# Patient Record
Sex: Male | Born: 1955 | Race: White | Hispanic: No | Smoking: Never smoker
Health system: Southern US, Community
[De-identification: ages and names within clinical notes are randomized; demographics above are authoritative.]

## PROBLEM LIST (undated history)

## (undated) DIAGNOSIS — I10 Essential (primary) hypertension: Secondary | ICD-10-CM

## (undated) DIAGNOSIS — E785 Hyperlipidemia, unspecified: Secondary | ICD-10-CM

## (undated) DIAGNOSIS — S83207A Unspecified tear of unspecified meniscus, current injury, left knee, initial encounter: Secondary | ICD-10-CM

## (undated) DIAGNOSIS — G4733 Obstructive sleep apnea (adult) (pediatric): Secondary | ICD-10-CM

## (undated) DIAGNOSIS — F32A Depression, unspecified: Secondary | ICD-10-CM

## (undated) DIAGNOSIS — F329 Major depressive disorder, single episode, unspecified: Secondary | ICD-10-CM

## (undated) DIAGNOSIS — E7800 Pure hypercholesterolemia, unspecified: Secondary | ICD-10-CM

## (undated) DIAGNOSIS — F909 Attention-deficit hyperactivity disorder, unspecified type: Secondary | ICD-10-CM

## (undated) DIAGNOSIS — G4730 Sleep apnea, unspecified: Secondary | ICD-10-CM

## (undated) DIAGNOSIS — M1990 Unspecified osteoarthritis, unspecified site: Secondary | ICD-10-CM

## (undated) HISTORY — PX: TONSILLECTOMY: SUR1361

## (undated) HISTORY — PX: OTHER SURGICAL HISTORY: SHX169

## (undated) HISTORY — PX: BACK SURGERY: SHX140

## (undated) HISTORY — PX: RETINAL DETACHMENT SURGERY: SHX105

## (undated) HISTORY — PX: LUMBAR DISC SURGERY: SHX700

## (undated) HISTORY — PX: TOTAL HIP ARTHROPLASTY: SHX124

## (undated) HISTORY — PX: VITRECTOMY: SHX106

## (undated) HISTORY — PX: ROTATOR CUFF REPAIR: SHX139

## (undated) HISTORY — PX: SHOULDER ARTHROSCOPY WITH ROTATOR CUFF REPAIR AND OPEN BICEPS TENODESIS: SHX6677

## (undated) HISTORY — PX: NASAL FRACTURE SURGERY: SHX718

## (undated) HISTORY — PX: GANGLION CYST EXCISION: SHX1691

## (undated) HISTORY — PX: NASAL SEPTUM SURGERY: SHX37

---

## 2002-08-25 HISTORY — PX: BICEPS TENDON REPAIR: SHX566

## 2007-02-22 ENCOUNTER — Ambulatory Visit (HOSPITAL_COMMUNITY): Admission: RE | Admit: 2007-02-22 | Discharge: 2007-02-22 | Payer: Self-pay | Admitting: *Deleted

## 2008-07-03 ENCOUNTER — Ambulatory Visit (HOSPITAL_BASED_OUTPATIENT_CLINIC_OR_DEPARTMENT_OTHER): Admission: RE | Admit: 2008-07-03 | Discharge: 2008-07-03 | Payer: Self-pay | Admitting: Otolaryngology

## 2009-05-29 ENCOUNTER — Ambulatory Visit (HOSPITAL_COMMUNITY): Admission: RE | Admit: 2009-05-29 | Discharge: 2009-05-29 | Payer: Self-pay | Admitting: General Surgery

## 2011-01-07 NOTE — Op Note (Signed)
NAME:  Jacob Arellano, Jacob Arellano NO.:  [id number]   MEDICAL RECORD NO.:  [id number]          PATIENT TYPE:  AMB   LOCATION:  DSC                          FACILITY:  [facility]   PHYSICIAN:  Karol T. Lazarus Salines, M.D. DATE OF BIRTH:  1956/07/26   DATE OF PROCEDURE:  07/03/2008  DATE OF DISCHARGE:                               OPERATIVE REPORT   PREOPERATIVE DIAGNOSIS:  Closed displaced nasal fracture.   POSTOPERATIVE DIAGNOSIS:  Closed displaced nasal fracture.   PROCEDURE PERFORMED:  Closed reduction of nasal fracture with  stabilization.   SURGEON:  Gloris Manchester. Lazarus Salines, MD   ANESTHESIA:  MAC   BLOOD LOSS:  None.   COMPLICATIONS:  None.   FINDINGS:  Somewhat amorphous/bulbous tip with the entire bony dorsum  deviated to the left and readily replaced to a more midline position.  Slight internal septal corrugation.   PROCEDURE:  With the patient in a comfortable supine position, general  mask and intravenous anesthesia was administered.  At an appropriate  level, the patient was placed in a slight sitting position.  Nose was  examined with the findings as described above.  A blunt fracture  elevator was measured to the level of the medial canthus and placed into  the nose and with anterior and right lateral pressure, the dorsum was  readily displaced and relatively stable.  No bleeding was noted.  The  nasal bones remained well elevated.   The external nose was cleaned with alcohol and then painted with skin  prep.  Steri-Strips were applied in the standard fashion following which  a small/medium [place] splint was applied and compressed for support.  Again, no significant bleeding was noted.  The splint was secured.  At  this point, the procedure was completed and the patient was returned to  Anesthesia, awakened, and transferred to recovery in stable condition.   COMMENT:  A 55 year old white male fell while gardening and banged his  nose, sustaining a displaced fracture,  hence the indication for today's  procedure.  Anticipate routine postoperative recovery with attention to  ice, elevation, analgesia, and routine hygiene, keeping the splint dry.  Given low anticipated risk of postanesthetic or postsurgical  complications, I feel an outpatient venue is appropriate.      Gloris Manchester. Lazarus Salines, M.D.  Electronically Signed     KTW/MEDQ  D:  07/03/2008  T:  07/03/2008  Job:  [id number]   cc:   Patrica Duel, M.D.

## 2011-05-27 LAB — POCT HEMOGLOBIN-HEMACUE: Hemoglobin: 13.7

## 2012-10-26 ENCOUNTER — Encounter (HOSPITAL_COMMUNITY): Payer: Self-pay | Admitting: Pharmacy Technician

## 2012-10-26 NOTE — Patient Instructions (Addendum)
Your procedure is scheduled on: 11/01/2012  Report to [facility] at  830     AM.  Call this number if you have problems the morning of surgery: [phone]   Do not eat food or drink liquids :After Midnight.      Take these medicines the morning of surgery with A SIP OF WATER: none   Do not wear jewelry, make-up or nail polish.  Do not wear lotions, powders, or perfumes.   Do not shave 48 hours prior to surgery.  Do not bring valuables to the hospital.  Contacts, dentures or bridgework may not be worn into surgery.  Leave suitcase in the car. After surgery it may be brought to your room.  For patients admitted to the hospital, checkout time is 11:00 AM the day of discharge.   Patients discharged the day of surgery will not be allowed to drive home.  :     Please read over the following fact sheets that you were given: Coughing and Deep Breathing, Surgical Site Infection Prevention, Anesthesia Post-op Instructions and Care and Recovery After Surgery    Cataract A cataract is a clouding of the lens of the eye. When a lens becomes cloudy, vision is reduced based on the degree and nature of the clouding. Many cataracts reduce vision to some degree. Some cataracts make people more near-sighted as they develop. Other cataracts increase glare. Cataracts that are ignored and become worse can sometimes look white. The white color can be seen through the pupil. CAUSES   Aging. However, cataracts may occur at any age, even in newborns.   Certain drugs.   Trauma to the eye.   Certain diseases such as diabetes.   Specific eye diseases such as chronic inflammation inside the eye or a sudden attack of a rare form of glaucoma.   Inherited or acquired medical problems.  SYMPTOMS   Gradual, progressive drop in vision in the affected eye.   Severe, rapid visual loss. This most often happens when trauma is the cause.  DIAGNOSIS  To detect a cataract, an eye doctor examines the lens. Cataracts  are best diagnosed with an exam of the eyes with the pupils enlarged (dilated) by drops.  TREATMENT  For an early cataract, vision may improve by using different eyeglasses or stronger lighting. If that does not help your vision, surgery is the only effective treatment. A cataract needs to be surgically removed when vision loss interferes with your everyday activities, such as driving, reading, or watching TV. A cataract may also have to be removed if it prevents examination or treatment of another eye problem. Surgery removes the cloudy lens and usually replaces it with a substitute lens (intraocular lens, IOL).  At a time when both you and your doctor agree, the cataract will be surgically removed. If you have cataracts in both eyes, only one is usually removed at a time. This allows the operated eye to heal and be out of danger from any possible problems after surgery (such as infection or poor wound healing). In rare cases, a cataract may be doing damage to your eye. In these cases, your caregiver may advise surgical removal right away. The vast majority of people who have cataract surgery have better vision afterward. HOME CARE INSTRUCTIONS  If you are not planning surgery, you may be asked to do the following:  Use different eyeglasses.   Use stronger or brighter lighting.   Ask your eye doctor about reducing your medicine dose or  changing medicines if it is thought that a medicine caused your cataract. Changing medicines does not make the cataract go away on its own.   Become familiar with your surroundings. Poor vision can lead to injury. Avoid bumping into things on the affected side. You are at a higher risk for tripping or falling.   Exercise extreme care when driving or operating machinery.   Wear sunglasses if you are sensitive to bright light or experiencing problems with glare.  SEEK IMMEDIATE MEDICAL CARE IF:   You have a worsening or sudden vision loss.   You notice redness,  swelling, or increasing pain in the eye.   You have a fever.  Document Released: 08/11/2005 Document Revised: 07/31/2011 Document Reviewed: 04/04/2011 [facility] Patient Information 2012 [place].PATIENT INSTRUCTIONS POST-ANESTHESIA  IMMEDIATELY FOLLOWING SURGERY:  Do not drive or operate machinery for the first twenty four hours after surgery.  Do not make any important decisions for twenty four hours after surgery or while taking narcotic pain medications or sedatives.  If you develop intractable nausea and vomiting or a severe headache please notify your doctor immediately.  FOLLOW-UP:  Please make an appointment with your surgeon as instructed. You do not need to follow up with anesthesia unless specifically instructed to do so.  WOUND CARE INSTRUCTIONS (if applicable):  Keep a dry clean dressing on the anesthesia/puncture wound site if there is drainage.  Once the wound has quit draining you may leave it open to air.  Generally you should leave the bandage intact for twenty four hours unless there is drainage.  If the epidural site drains for more than 36-48 hours please call the anesthesia department.  QUESTIONS?:  Please feel free to call your physician or the hospital operator if you have any questions, and they will be happy to assist you.

## 2012-10-27 ENCOUNTER — Encounter (HOSPITAL_COMMUNITY)
Admission: RE | Admit: 2012-10-27 | Discharge: 2012-10-27 | Disposition: A | Discharge status: Home / Self Care | Payer: BC Managed Care – PPO | Source: Ambulatory Visit | Attending: Ophthalmology | Admitting: Ophthalmology

## 2012-10-27 ENCOUNTER — Encounter (HOSPITAL_COMMUNITY): Payer: Self-pay

## 2012-10-27 ENCOUNTER — Other Ambulatory Visit: Payer: Self-pay

## 2012-10-27 HISTORY — DX: Major depressive disorder, single episode, unspecified: F32.9

## 2012-10-27 HISTORY — DX: Depression, unspecified: F32.A

## 2012-10-27 HISTORY — DX: Essential (primary) hypertension: I10

## 2012-10-27 LAB — HEMOGLOBIN AND HEMATOCRIT, BLOOD: HCT: 39.6 % (ref 39.0–52.0)

## 2012-10-27 LAB — BASIC METABOLIC PANEL
CO2: 26 mEq/L (ref 19–32)
Calcium: 9.5 mg/dL (ref 8.4–10.5)
Creatinine, Ser: 1.09 mg/dL (ref 0.50–1.35)
GFR calc non Af Amer: 74 mL/min — ABNORMAL LOW (ref >90)
Glucose, Bld: 110 mg/dL — ABNORMAL HIGH (ref 70–99)

## 2012-10-29 MED ORDER — CYCLOPENTOLATE-PHENYLEPHRINE 0.2-1 % OP SOLN
OPHTHALMIC | Status: AC
  Filled 2012-10-29: qty 2

## 2012-10-29 MED ORDER — LIDOCAINE HCL (PF) 1 % IJ SOLN
INTRAMUSCULAR | Status: AC
  Filled 2012-10-29: qty 2

## 2012-10-29 MED ORDER — TETRACAINE HCL 0.5 % OP SOLN
OPHTHALMIC | Status: AC
  Filled 2012-10-29: qty 2

## 2012-10-29 MED ORDER — LIDOCAINE HCL 3.5 % OP GEL
OPHTHALMIC | Status: AC
  Filled 2012-10-29: qty 5

## 2012-10-29 MED ORDER — PHENYLEPHRINE HCL 2.5 % OP SOLN
OPHTHALMIC | Status: AC
  Filled 2012-10-29: qty 2

## 2012-10-29 MED ORDER — NEOMYCIN-POLYMYXIN-DEXAMETH 3.5-10000-0.1 OP OINT
TOPICAL_OINTMENT | OPHTHALMIC | Status: AC
  Filled 2012-10-29: qty 3.5

## 2012-11-01 ENCOUNTER — Encounter (HOSPITAL_COMMUNITY): Payer: Self-pay | Admitting: Anesthesiology

## 2012-11-01 ENCOUNTER — Ambulatory Visit (HOSPITAL_COMMUNITY)
Admission: RE | Admit: 2012-11-01 | Discharge: 2012-11-01 | Disposition: A | Discharge status: Home / Self Care | Payer: BC Managed Care – PPO | Source: Ambulatory Visit | Attending: Ophthalmology | Admitting: Ophthalmology

## 2012-11-01 ENCOUNTER — Encounter (HOSPITAL_COMMUNITY)
Admission: RE | Disposition: A | Discharge status: Home / Self Care | Payer: Self-pay | Source: Ambulatory Visit | Attending: Ophthalmology

## 2012-11-01 ENCOUNTER — Ambulatory Visit (HOSPITAL_COMMUNITY): Payer: BC Managed Care – PPO | Admitting: Anesthesiology

## 2012-11-01 ENCOUNTER — Encounter (HOSPITAL_COMMUNITY): Payer: Self-pay | Admitting: *Deleted

## 2012-11-01 DIAGNOSIS — I10 Essential (primary) hypertension: Secondary | ICD-10-CM | POA: Insufficient documentation

## 2012-11-01 DIAGNOSIS — Z01812 Encounter for preprocedural laboratory examination: Secondary | ICD-10-CM | POA: Insufficient documentation

## 2012-11-01 DIAGNOSIS — H2589 Other age-related cataract: Secondary | ICD-10-CM | POA: Insufficient documentation

## 2012-11-01 DIAGNOSIS — Z01810 Encounter for preprocedural cardiovascular examination: Secondary | ICD-10-CM | POA: Insufficient documentation

## 2012-11-01 HISTORY — PX: CATARACT EXTRACTION W/PHACO: SHX586

## 2012-11-01 SURGERY — PHACOEMULSIFICATION, CATARACT, WITH IOL INSERTION
Anesthesia: Monitor Anesthesia Care | Site: Eye | Laterality: Right | Wound class: Clean

## 2012-11-01 MED ORDER — LIDOCAINE 3.5 % OP GEL OPTIME - NO CHARGE
OPHTHALMIC | Status: DC | PRN
  Administered 2012-11-01: 1 [drp] via OPHTHALMIC

## 2012-11-01 MED ORDER — MIDAZOLAM HCL 2 MG/2ML IJ SOLN
INTRAMUSCULAR | Status: AC
  Filled 2012-11-01: qty 2

## 2012-11-01 MED ORDER — MIDAZOLAM HCL 2 MG/2ML IJ SOLN
1.0000 mg | INTRAMUSCULAR | Status: AC | PRN
  Administered 2012-11-01 (×3): 2 mg via INTRAVENOUS

## 2012-11-01 MED ORDER — LACTATED RINGERS IV SOLN
INTRAVENOUS | Status: DC
  Administered 2012-11-01: 09:00:00 via INTRAVENOUS

## 2012-11-01 MED ORDER — POVIDONE-IODINE 5 % OP SOLN
OPHTHALMIC | Status: DC | PRN
  Administered 2012-11-01: 1 via OPHTHALMIC

## 2012-11-01 MED ORDER — LIDOCAINE HCL (PF) 1 % IJ SOLN
INTRAMUSCULAR | Status: DC | PRN
  Administered 2012-11-01: .3 mL

## 2012-11-01 MED ORDER — TETRACAINE HCL 0.5 % OP SOLN
1.0000 [drp] | OPHTHALMIC | Status: AC
  Administered 2012-11-01 (×3): 1 [drp] via OPHTHALMIC

## 2012-11-01 MED ORDER — EPINEPHRINE HCL 1 MG/ML IJ SOLN
INTRAOCULAR | Status: DC | PRN
  Administered 2012-11-01: 10:00:00

## 2012-11-01 MED ORDER — NEOMYCIN-POLYMYXIN-DEXAMETH 0.1 % OP OINT
TOPICAL_OINTMENT | OPHTHALMIC | Status: DC | PRN
  Administered 2012-11-01: 1 via OPHTHALMIC

## 2012-11-01 MED ORDER — EPINEPHRINE HCL 1 MG/ML IJ SOLN
INTRAMUSCULAR | Status: AC
  Filled 2012-11-01: qty 1

## 2012-11-01 MED ORDER — LIDOCAINE HCL 3.5 % OP GEL: 1.0000 "application " | Freq: Once | OPHTHALMIC | Status: DC

## 2012-11-01 MED ORDER — PROVISC 10 MG/ML IO SOLN
INTRAOCULAR | Status: DC | PRN
  Administered 2012-11-01: 8.5 mg via INTRAOCULAR

## 2012-11-01 MED ORDER — CYCLOPENTOLATE-PHENYLEPHRINE 0.2-1 % OP SOLN
1.0000 [drp] | OPHTHALMIC | Status: AC
  Administered 2012-11-01 (×3): 1 [drp] via OPHTHALMIC

## 2012-11-01 MED ORDER — BSS IO SOLN
INTRAOCULAR | Status: DC | PRN
  Administered 2012-11-01: 15 mL via INTRAOCULAR

## 2012-11-01 MED ORDER — PHENYLEPHRINE HCL 2.5 % OP SOLN
1.0000 [drp] | OPHTHALMIC | Status: AC
  Administered 2012-11-01 (×3): 1 [drp] via OPHTHALMIC

## 2012-11-01 SURGICAL SUPPLY — 11 items
CLOTH BEACON ORANGE TIMEOUT ST (SAFETY) ×1 IMPLANT
EYE SHIELD UNIVERSAL CLEAR (GAUZE/BANDAGES/DRESSINGS) ×1 IMPLANT
GLOVE BIOGEL PI IND STRL 6.5 (GLOVE) IMPLANT
GLOVE BIOGEL PI INDICATOR 6.5 (GLOVE) ×1
GLOVE EXAM NITRILE MD LF STRL (GLOVE) ×1 IMPLANT
PAD ARMBOARD 7.5X6 YLW CONV (MISCELLANEOUS) ×1 IMPLANT
SIGHTPATH CAT PROC W REG LENS (Ophthalmic Related) ×2 IMPLANT
SYR TB 1ML LL NO SAFETY (SYRINGE) ×1 IMPLANT
TAPE SURG TRANSPORE 1 IN (GAUZE/BANDAGES/DRESSINGS) IMPLANT
TAPE SURGICAL TRANSPORE 1 IN (GAUZE/BANDAGES/DRESSINGS) ×1
WATER STERILE IRR 250ML POUR (IV SOLUTION) ×1 IMPLANT

## 2012-11-01 NOTE — Anesthesia Preprocedure Evaluation (Signed)
Anesthesia Evaluation  Patient identified by MRN, date of birth, ID band Patient awake    Reviewed: Allergy & Precautions, H&P , NPO status , Patient's Chart, lab work & pertinent test results  Airway Mallampati: II      Dental  (+) Teeth Intact   Pulmonary neg pulmonary ROS,  breath sounds clear to auscultation        Cardiovascular hypertension, Pt. on medications Rhythm:Regular Rate:Normal     Neuro/Psych PSYCHIATRIC DISORDERS Depression    GI/Hepatic negative GI ROS,   Endo/Other    Renal/GU      Musculoskeletal   Abdominal   Peds  Hematology   Anesthesia Other Findings   Reproductive/Obstetrics                           Anesthesia Physical Anesthesia Plan  ASA: II  Anesthesia Plan: MAC   Post-op Pain Management:    Induction: Intravenous  Airway Management Planned: Nasal Cannula  Additional Equipment:   Intra-op Plan:   Post-operative Plan:   Informed Consent: I have reviewed the patients History and Physical, chart, labs and discussed the procedure including the risks, benefits and alternatives for the proposed anesthesia with the patient or authorized representative who has indicated his/her understanding and acceptance.     Plan Discussed with:   Anesthesia Plan Comments:         Anesthesia Quick Evaluation

## 2012-11-01 NOTE — Transfer of Care (Signed)
Immediate Anesthesia Transfer of Care Note  Patient: Jacob Arellano  Procedure(s) Performed: Procedure(s) with comments: CATARACT EXTRACTION PHACO AND INTRAOCULAR LENS PLACEMENT (IOC) (Right) - CDE=7.50  Patient Location: PACU and Short Stay  Anesthesia Type:MAC  Level of Consciousness: awake  Airway & Oxygen Therapy: Patient Spontanous Breathing  Post-op Assessment: Report given to PACU RN  Post vital signs: Reviewed  Complications: No apparent anesthesia complications

## 2012-11-01 NOTE — H&P (Signed)
I have reviewed the H&P, the patient was re-examined, and I have identified no interval changes in medical condition and plan of care since the history and physical of record  

## 2012-11-01 NOTE — Brief Op Note (Signed)
Pre-Op Dx: Cataract OD Post-Op Dx: Cataract OD Surgeon: Yovanny Coats Anesthesia: Topical with MAC Surgery: Cataract Extraction with Intraocular lens Implant OD Implant: B&L enVista Specimen: None Complications: None 

## 2012-11-01 NOTE — Anesthesia Postprocedure Evaluation (Signed)
  Anesthesia Post-op Note  Patient: Jacob Arellano  Procedure(s) Performed: Procedure(s) with comments: CATARACT EXTRACTION PHACO AND INTRAOCULAR LENS PLACEMENT (IOC) (Right) - CDE=7.50  Patient Location: PACU and Short Stay  Anesthesia Type:MAC  Level of Consciousness: awake, alert  and oriented  Airway and Oxygen Therapy: Patient Spontanous Breathing  Post-op Pain: none  Post-op Assessment: Post-op Vital signs reviewed, Patient's Cardiovascular Status Stable, Respiratory Function Stable, Patent Airway and No signs of Nausea or vomiting  Post-op Vital Signs: Reviewed and stable  Complications: No apparent anesthesia complications

## 2012-11-02 NOTE — Op Note (Signed)
NAME:  OVIE, CORNELIO NO.:  [id number]  MEDICAL RECORD NO.:  [id number]  LOCATION:  [facility]                          FACILITY:  [facility]  PHYSICIAN:  Susanne Greenhouse, MD       DATE OF BIRTH:  Dec 26, 1955  DATE OF PROCEDURE:  11/01/2012 DATE OF DISCHARGE:  11/01/2012                              OPERATIVE REPORT   PREOPERATIVE DIAGNOSIS:  Combined cataract, right eye, diagnosis code 366.19.  POSTOPERATIVE DIAGNOSIS:  Combined cataract, right eye, diagnosis code 366.19.  ANESTHESIA:  Topical with monitored anesthesia care and IV sedation.  OPERATION PERFORMED:  Phacoemulsification with posterior chamber intraocular lens implantation, right eye.  OPERATIVE SUMMARY:  In the preoperative area, dilating drops were placed into the right eye.  The patient was then brought into the operating room where she was placed under general anesthesia.  The eye was then prepped and draped.  Beginning with a 75 blade, a paracentesis port was made at the surgeon's 2 o'clock position.  The anterior chamber was then filled with a 1% nonpreserved lidocaine solution with epinephrine.  This was followed by Viscoat to deepen the chamber.  A small fornix-based peritomy was performed superiorly.  Next, a single iris hook was placed through the limbus superiorly.  A 2.4-mm keratome blade was then used to make a clear corneal incision over the iris hook.  A bent cystotome needle and Utrata forceps were used to create a continuous tear capsulotomy.  Hydrodissection was performed using balanced salt solution on a fine cannula.  The lens nucleus was then removed using phacoemulsification in a quadrant cracking technique.  The cortical material was then removed with irrigation and aspiration.  The capsular bag and anterior chamber were refilled with Provisc.  The wound was widened to approximately 3 mm and a posterior chamber intraocular lens was placed into the capsular bag without difficulty using an  [employer] lens injecting system.  A single 10-0 nylon suture was then used to close the incision as well as stromal hydration.  The Provisc was removed from the anterior chamber and capsular bag with irrigation and aspiration.  At this point, the wounds were tested for leak, which were negative.  The anterior chamber remained deep and stable.  The patient tolerated the procedure well.  There were no operative complications, and she awoke from general anesthesia without problem.  No surgical specimens.  Prosthetic device used is Bausch and Lomb posterior chamber lens, model enVista, model number MX60, power of 10.5, serial number is [id number].          ______________________________ Susanne Greenhouse, MD    KEH/MEDQ  D:  11/01/2012  T:  11/02/2012  Job:  [id number]

## 2012-11-03 ENCOUNTER — Encounter (HOSPITAL_COMMUNITY): Payer: Self-pay | Admitting: Ophthalmology

## 2012-11-10 ENCOUNTER — Encounter (HOSPITAL_COMMUNITY): Payer: Self-pay | Admitting: Pharmacy Technician

## 2012-11-11 ENCOUNTER — Encounter (HOSPITAL_COMMUNITY)
Admission: RE | Admit: 2012-11-11 | Discharge: 2012-11-11 | Disposition: A | Discharge status: Home / Self Care | Payer: BC Managed Care – PPO | Source: Ambulatory Visit | Attending: Ophthalmology | Admitting: Ophthalmology

## 2012-11-11 ENCOUNTER — Encounter (HOSPITAL_COMMUNITY): Payer: Self-pay

## 2012-11-11 MED ORDER — ONDANSETRON HCL 4 MG/2ML IJ SOLN: 4.0000 mg | Freq: Once | INTRAMUSCULAR | Status: AC | PRN

## 2012-11-11 MED ORDER — FENTANYL CITRATE 0.05 MG/ML IJ SOLN: 25.0000 ug | INTRAMUSCULAR | Status: DC | PRN

## 2012-11-12 MED ORDER — LIDOCAINE HCL (PF) 1 % IJ SOLN
INTRAMUSCULAR | Status: AC
  Filled 2012-11-12: qty 2

## 2012-11-12 MED ORDER — LIDOCAINE HCL 3.5 % OP GEL
OPHTHALMIC | Status: AC
  Filled 2012-11-12: qty 5

## 2012-11-12 MED ORDER — PHENYLEPHRINE HCL 2.5 % OP SOLN
OPHTHALMIC | Status: AC
  Filled 2012-11-12: qty 2

## 2012-11-12 MED ORDER — NEOMYCIN-POLYMYXIN-DEXAMETH 3.5-10000-0.1 OP OINT
TOPICAL_OINTMENT | OPHTHALMIC | Status: AC
  Filled 2012-11-12: qty 3.5

## 2012-11-12 MED ORDER — TETRACAINE HCL 0.5 % OP SOLN
OPHTHALMIC | Status: AC
  Filled 2012-11-12: qty 2

## 2012-11-12 MED ORDER — CYCLOPENTOLATE-PHENYLEPHRINE 0.2-1 % OP SOLN
OPHTHALMIC | Status: AC
  Filled 2012-11-12: qty 2

## 2012-11-15 ENCOUNTER — Ambulatory Visit (HOSPITAL_COMMUNITY): Payer: BC Managed Care – PPO | Admitting: Anesthesiology

## 2012-11-15 ENCOUNTER — Encounter (HOSPITAL_COMMUNITY): Payer: Self-pay | Admitting: *Deleted

## 2012-11-15 ENCOUNTER — Encounter (HOSPITAL_COMMUNITY)
Admission: RE | Disposition: A | Discharge status: Home / Self Care | Payer: Self-pay | Source: Ambulatory Visit | Attending: Ophthalmology

## 2012-11-15 ENCOUNTER — Ambulatory Visit (HOSPITAL_COMMUNITY)
Admission: RE | Admit: 2012-11-15 | Discharge: 2012-11-15 | Disposition: A | Discharge status: Home / Self Care | Payer: BC Managed Care – PPO | Source: Ambulatory Visit | Attending: Ophthalmology | Admitting: Ophthalmology

## 2012-11-15 ENCOUNTER — Encounter (HOSPITAL_COMMUNITY): Payer: Self-pay | Admitting: Anesthesiology

## 2012-11-15 DIAGNOSIS — F329 Major depressive disorder, single episode, unspecified: Secondary | ICD-10-CM | POA: Insufficient documentation

## 2012-11-15 DIAGNOSIS — Z79899 Other long term (current) drug therapy: Secondary | ICD-10-CM | POA: Insufficient documentation

## 2012-11-15 DIAGNOSIS — F3289 Other specified depressive episodes: Secondary | ICD-10-CM | POA: Insufficient documentation

## 2012-11-15 DIAGNOSIS — H269 Unspecified cataract: Secondary | ICD-10-CM | POA: Insufficient documentation

## 2012-11-15 DIAGNOSIS — F411 Generalized anxiety disorder: Secondary | ICD-10-CM | POA: Insufficient documentation

## 2012-11-15 DIAGNOSIS — E7800 Pure hypercholesterolemia, unspecified: Secondary | ICD-10-CM | POA: Insufficient documentation

## 2012-11-15 HISTORY — PX: CATARACT EXTRACTION W/PHACO: SHX586

## 2012-11-15 SURGERY — PHACOEMULSIFICATION, CATARACT, WITH IOL INSERTION
Anesthesia: Monitor Anesthesia Care | Site: Eye | Laterality: Left | Wound class: Clean

## 2012-11-15 MED ORDER — BSS IO SOLN
INTRAOCULAR | Status: DC | PRN
  Administered 2012-11-15: 15 mL via INTRAOCULAR

## 2012-11-15 MED ORDER — MIDAZOLAM HCL 2 MG/2ML IJ SOLN
1.0000 mg | INTRAMUSCULAR | Status: DC | PRN
  Administered 2012-11-15 (×3): 2 mg via INTRAVENOUS

## 2012-11-15 MED ORDER — PHENYLEPHRINE HCL 2.5 % OP SOLN
1.0000 [drp] | OPHTHALMIC | Status: AC
  Administered 2012-11-15 (×3): 1 [drp] via OPHTHALMIC

## 2012-11-15 MED ORDER — PROVISC 10 MG/ML IO SOLN
INTRAOCULAR | Status: DC | PRN
  Administered 2012-11-15: 8.5 mg via INTRAOCULAR

## 2012-11-15 MED ORDER — EPINEPHRINE HCL 1 MG/ML IJ SOLN
INTRAMUSCULAR | Status: AC
  Filled 2012-11-15: qty 1

## 2012-11-15 MED ORDER — ONDANSETRON HCL 4 MG/2ML IJ SOLN: 4.0000 mg | Freq: Once | INTRAMUSCULAR | Status: DC | PRN

## 2012-11-15 MED ORDER — MIDAZOLAM HCL 2 MG/2ML IJ SOLN
INTRAMUSCULAR | Status: AC
  Filled 2012-11-15: qty 2

## 2012-11-15 MED ORDER — POVIDONE-IODINE 5 % OP SOLN
OPHTHALMIC | Status: DC | PRN
  Administered 2012-11-15: 1 via OPHTHALMIC

## 2012-11-15 MED ORDER — LACTATED RINGERS IV SOLN
INTRAVENOUS | Status: DC
  Administered 2012-11-15: 10:00:00 via INTRAVENOUS

## 2012-11-15 MED ORDER — NEOMYCIN-POLYMYXIN-DEXAMETH 0.1 % OP OINT
TOPICAL_OINTMENT | OPHTHALMIC | Status: DC | PRN
  Administered 2012-11-15: 1 via OPHTHALMIC

## 2012-11-15 MED ORDER — LIDOCAINE HCL 3.5 % OP GEL
1.0000 "application " | Freq: Once | OPHTHALMIC | Status: AC
  Administered 2012-11-15: 1 via OPHTHALMIC

## 2012-11-15 MED ORDER — TETRACAINE HCL 0.5 % OP SOLN
1.0000 [drp] | OPHTHALMIC | Status: AC
  Administered 2012-11-15 (×3): 1 [drp] via OPHTHALMIC

## 2012-11-15 MED ORDER — FENTANYL CITRATE 0.05 MG/ML IJ SOLN: 25.0000 ug | INTRAMUSCULAR | Status: DC | PRN

## 2012-11-15 MED ORDER — LIDOCAINE HCL (PF) 1 % IJ SOLN
INTRAMUSCULAR | Status: DC | PRN
  Administered 2012-11-15: .5 mL

## 2012-11-15 MED ORDER — EPINEPHRINE HCL 1 MG/ML IJ SOLN
INTRAOCULAR | Status: DC | PRN
  Administered 2012-11-15: 11:00:00

## 2012-11-15 MED ORDER — CYCLOPENTOLATE-PHENYLEPHRINE 0.2-1 % OP SOLN
1.0000 [drp] | OPHTHALMIC | Status: AC
  Administered 2012-11-15 (×3): 1 [drp] via OPHTHALMIC

## 2012-11-15 SURGICAL SUPPLY — 32 items

## 2012-11-15 NOTE — H&P (Signed)
I have reviewed the H&P, the patient was re-examined, and I have identified no interval changes in medical condition and plan of care since the history and physical of record  

## 2012-11-15 NOTE — Anesthesia Preprocedure Evaluation (Signed)
Anesthesia Evaluation  Patient identified by MRN, date of birth, ID band Patient awake    Reviewed: Allergy & Precautions, H&P , NPO status , Patient's Chart, lab work & pertinent test results  Airway Mallampati: II      Dental  (+) Teeth Intact   Pulmonary neg pulmonary ROS,  breath sounds clear to auscultation        Cardiovascular hypertension, Pt. on medications Rhythm:Regular Rate:Normal     Neuro/Psych PSYCHIATRIC DISORDERS Depression    GI/Hepatic negative GI ROS,   Endo/Other    Renal/GU      Musculoskeletal   Abdominal   Peds  Hematology   Anesthesia Other Findings   Reproductive/Obstetrics                           Anesthesia Physical Anesthesia Plan  ASA: II  Anesthesia Plan: MAC   Post-op Pain Management:    Induction:   Airway Management Planned: Nasal Cannula  Additional Equipment:   Intra-op Plan:   Post-operative Plan:   Informed Consent: I have reviewed the patients History and Physical, chart, labs and discussed the procedure including the risks, benefits and alternatives for the proposed anesthesia with the patient or authorized representative who has indicated his/her understanding and acceptance.     Plan Discussed with: CRNA  Anesthesia Plan Comments:         Anesthesia Quick Evaluation

## 2012-11-15 NOTE — Transfer of Care (Signed)
Immediate Anesthesia Transfer of Care Note  Patient: Jacob Arellano  Procedure(s) Performed: Procedure(s) with comments: CATARACT EXTRACTION PHACO AND INTRAOCULAR LENS PLACEMENT (IOC) (Left) - CDE 6.68  Patient Location: PACU and Short Stay  Anesthesia Type:MAC  Level of Consciousness: awake, alert  and oriented  Airway & Oxygen Therapy: Patient Spontanous Breathing  Post-op Assessment: Report given to PACU RN  Post vital signs: Reviewed and stable  Complications: No apparent anesthesia complications

## 2012-11-15 NOTE — Anesthesia Postprocedure Evaluation (Signed)
  Anesthesia Post-op Note  Patient: Jacob Arellano  Procedure(s) Performed: Procedure(s) with comments: CATARACT EXTRACTION PHACO AND INTRAOCULAR LENS PLACEMENT (IOC) (Left) - CDE 6.68  Patient Location: PACU and Short Stay  Anesthesia Type:MAC  Level of Consciousness: awake, alert  and oriented  Airway and Oxygen Therapy: Patient Spontanous Breathing  Post-op Pain: none  Post-op Assessment: Post-op Vital signs reviewed, Patient's Cardiovascular Status Stable, Respiratory Function Stable, Patent Airway and No signs of Nausea or vomiting  Post-op Vital Signs: Reviewed and stable  Complications: No apparent anesthesia complications

## 2012-11-15 NOTE — Op Note (Signed)
Date of Admission: Today  Date of Surgery: Today  Pre-Op Dx: Cataract Left Eye  Post-Op Dx: Cataract Left Eye  Surgeon: Gemma Payor, M.D.  Assistants: None  Anesthesia: Topical with MAC  Indications: Painless, progressive loss of vision with compromise of daily activities.  Surgery: Cataract Extraction with Intraocular lens Implant Left Eye  Discription: The patient had dilating drops and viscous lidocaine placed into the left eye in the pre-op holding area. After transfer to the operating room, a time out was performed. The patient was then prepped and draped. Beginning with a 75 degree blade a paracentesis port was made at the surgeon's 2 o'clock position. The anterior chamber was then filled with 2% non-preserved lidocaine. This was followed by filling the anterior chamber with Provisc. A bent cystatome needle was used to create a continuous tear capsulotomy. Hydrodissection was performed with balanced salt solution on a Fine canula. The lens nucleus was then removed using the phacoemulsification handpiece. Residual cortex was removed with the I&A handpiece. The anterior chamber and capsular bag were refilled with Provisc. A posterior chamber intraocular lens was placed into the capsular bag with it's injector. The implant was positioned with the Kuglan hook. The Provisc was then removed from the anterior chamber and capsular bag with the I&A handpiece. Stromal hydration of the main incision and paracentesis port was performed with BSS on a Fine canula. The wounds were tested for leak which was negative. The patient tolerated the procedure well. There were no operative complications. The patient was then transferred to the recovery room in stable condition.  Prosthetic device:  B&L enVista, power 10.0D.  Specimen: None  EBL: None  Complications: None

## 2012-11-17 ENCOUNTER — Encounter (HOSPITAL_COMMUNITY): Payer: Self-pay | Admitting: Ophthalmology

## 2015-08-26 HISTORY — PX: VITRECTOMY: SHX106

## 2015-12-29 ENCOUNTER — Encounter (HOSPITAL_COMMUNITY): Payer: Self-pay | Admitting: Emergency Medicine

## 2015-12-29 ENCOUNTER — Emergency Department (HOSPITAL_COMMUNITY)
Admission: EM | Admit: 2015-12-29 | Discharge: 2015-12-30 | Disposition: A | Discharge status: Home / Self Care | Payer: 59 | Attending: Emergency Medicine | Admitting: Emergency Medicine

## 2015-12-29 DIAGNOSIS — Z7982 Long term (current) use of aspirin: Secondary | ICD-10-CM | POA: Insufficient documentation

## 2015-12-29 DIAGNOSIS — H33302 Unspecified retinal break, left eye: Secondary | ICD-10-CM | POA: Insufficient documentation

## 2015-12-29 DIAGNOSIS — H33312 Horseshoe tear of retina without detachment, left eye: Secondary | ICD-10-CM

## 2015-12-29 DIAGNOSIS — Z79899 Other long term (current) drug therapy: Secondary | ICD-10-CM | POA: Insufficient documentation

## 2015-12-29 DIAGNOSIS — I10 Essential (primary) hypertension: Secondary | ICD-10-CM | POA: Insufficient documentation

## 2015-12-29 DIAGNOSIS — F329 Major depressive disorder, single episode, unspecified: Secondary | ICD-10-CM | POA: Insufficient documentation

## 2015-12-29 NOTE — ED Provider Notes (Signed)
CSN: [id number]     Arrival date & time 12/29/15  2209 History   First MD Initiated Contact with Patient 12/29/15 2220     Chief Complaint  Patient presents with  . Eye Problem     (Consider location/radiation/quality/duration/timing/severity/associated sxs/prior Treatment) HPI Comments: Patient presents to the emergency department with chief complaint of blurred vision. He states that the symptoms only affect his left eye. He reports seeing floaters 2 days ago, he had flashing lights yesterday, and has had increasingly blurred vision today, which is worsened rapidly in the left eye. He was seen by his optometrist today, who told him that he had 2 retinal tears, and advised him to come to the emergency department for evaluation by an ophthalmologist. Patient has prior history of cataract surgery and lens implantation performed in [facility] in 2014.  He denies any other symptoms.  The history is provided by the patient. No language interpreter was used.    Past Medical History  Diagnosis Date  . Hypertension   . Depression    Past Surgical History  Procedure Laterality Date  . Tonsillectomy    . Back surgery    . Ganglion cyst excision      left wrist  . Rotator cuff repair      right  . Left bicep repair    . Nasal septum surgery    . Broken nose    . Cataract extraction w/phaco Right 11/01/2012    Procedure: CATARACT EXTRACTION PHACO AND INTRAOCULAR LENS PLACEMENT (IOC);  Surgeon: Tonny Branch, MD;  Location: [facility];  Service: Ophthalmology;  Laterality: Right;  CDE=7.50  . Cataract extraction w/phaco Left 11/15/2012    Procedure: CATARACT EXTRACTION PHACO AND INTRAOCULAR LENS PLACEMENT (IOC);  Surgeon: Tonny Branch, MD;  Location: [facility];  Service: Ophthalmology;  Laterality: Left;  CDE 6.68   No family history on file. Social History  Substance Use Topics  . Smoking status: Never Smoker   . Smokeless tobacco: None  . Alcohol Use: Yes     Comment: social    Review of Systems   Eyes: Positive for visual disturbance.  All other systems reviewed and are negative.     Allergies  Review of patient's allergies indicates no known allergies.  Home Medications   Prior to Admission medications   Medication Sig Start Date End Date Taking? Authorizing Provider  aspirin EC 81 MG tablet Take 81 mg by mouth daily.    Historical Provider, MD  citalopram (CELEXA) 20 MG tablet Take 20 mg by mouth daily.    Historical Provider, MD  fenofibrate (TRICOR) 48 MG tablet Take 48 mg by mouth daily.    Historical Provider, MD  lamoTRIgine (LAMICTAL) 100 MG tablet Take 100 mg by mouth daily.    Historical Provider, MD  lisinopril-hydrochlorothiazide (PRINZIDE,ZESTORETIC) 10-12.5 MG per tablet Take 1 tablet by mouth daily.    Historical Provider, MD  simvastatin (ZOCOR) 40 MG tablet Take 40 mg by mouth every evening.    Historical Provider, MD   BP 128/82 mmHg  Pulse 78  Temp(Src) 97.9 F (36.6 C) (Oral)  Resp 18  SpO2 97% Physical Exam  Constitutional: He is oriented to person, place, and time. He appears well-developed and well-nourished.  HENT:  Head: Normocephalic and atraumatic.  Eyes: Conjunctivae and EOM are normal. Pupils are equal, round, and reactive to light. Right eye exhibits no discharge. Left eye exhibits no discharge. No scleral icterus.  Left pupil dilated (dilated by optometry PTA) No entrapment No obvious  abnormality on bedside fundoscopic exam  Neck: Normal range of motion. Neck supple. No JVD present.  Cardiovascular: Normal rate, regular rhythm and normal heart sounds.  Exam reveals no gallop and no friction rub.   No murmur heard. Pulmonary/Chest: Effort normal and breath sounds normal. No respiratory distress. He has no wheezes. He has no rales. He exhibits no tenderness.  Abdominal: Soft. He exhibits no distension and no mass. There is no tenderness. There is no rebound and no guarding.  Musculoskeletal: Normal range of motion. He exhibits no edema or  tenderness.  Neurological: He is alert and oriented to person, place, and time.  Skin: Skin is warm and dry.  Psychiatric: He has a normal mood and affect. His behavior is normal. Judgment and thought content normal.  Nursing note and vitals reviewed.   ED Course  Procedures (including critical care time)  EMERGENCY DEPARTMENT [place] OCULAR EXAM "Study: Limited Ultrasound of Orbit "  INDICATIONS: Vision loss  Linear probe utilized to obtain images in both long and short axis of the orbit having the patient look left and right if possible.  PERFORMED BY: Myself  IMAGES ARCHIVED?: Yes  LIMITATIONS: none  VIEWS USED: Left orbit  INTERPRETATION: Possible small superior retinal detachment   MDM   Final diagnoses:  Retinal tear of left eye    Patient sent to emergency department with 2 reported retinal tears. He was seen by his optometrist today. He has had floaters for 2 days, saw bright flashing lights yesterday, and has had progressively worsening/darkening vision today.  2236  Attempted to contact on-call ophthalmology for [facility].  Several attempts were made by two separate secretaries and were told that nobody is taking call as of 5pm today.  I also verified this using AMION scheduling software, where there is a bullet that clarifies that today's call schedule is 8am-5pm, instead of the traditional 8am-8am.  Discussed this with Dr. Ashok Cordia, who recommends consulting with [facility] ophthalmology.  11:50 PM Appreciate Dr. Nestor Ramp from [facility] ophthalmology for telephone consult.  He states that he will take care of the patient at [facility].  Will transfer ED to ED.  Patient will transfer by private vehicle.  His spouse is here with him and will be driving.  Discussed with Dr. Ashok Cordia, who agrees with the plan.    Montine Circle, PA-C 12/30/15 0022  Lajean Saver, MD 12/30/15 [phone]

## 2015-12-29 NOTE — Discharge Instructions (Signed)
Retinal Detachment Retinal detachment occurs when the thin membrane that covers the back of the eye (retina) separates (detaches) from the eyeball. The retina is the part of your eye that sends visual signals to the brain along the optic nerve. This allows you to see. Retinal detachment causes vision loss. This can range from blurriness and cloudiness to complete blindness. Sometimes vision improves or returns after treatment. Loss of central vision is more likely to be permanent than vision loss that affects side (peripheral) vision. There are 3 types of retinal detachment:  Rhegmatogenous.  This is the most common type.  It happens when a tear in the retina lets fluid into the area behind the retina.  This type of detachment separates the retina from the layer of cells beneath it (retinal pigment epithelium [RPE]).  Tractional.  This type occurs when scar tissue on the surface of the retina contracts.  This causes the retina to detach from the RPE.  Exudative.  This type is not caused by a tear in the retina.  This type of detachment happens when a disorder or an injury causes fluid to leak into the area behind the retina. CAUSES  Retinal detachment may be caused by:  Holes or tears in the retina.  Eye trauma or injury.  Certain diseases, including diabetes.  Other eye conditions, such as being nearsighted or having degenerative myopia. RISK FACTORS Retinal detachment is more likely to develop if:  You have a family history of retinal detachment.  You have had a previous retinal detachment.  You are 59 years of age or older.  You are male.  You have had an eye injury.  You have had surgery for cataracts.  You are nearsighted, or you have certain other eye problems. SYMPTOMS  Symptoms of retinal detachment include:  Seeing flashes of light.  Seeing floating specks or cobwebs (floaters) in front of your eye.  Seeing a black area in any part of your  vision.  Having fuzzy vision.  Seeing a floating empty circle in front of you. DIAGNOSIS  Your health care provider may suspect retinal detachment based on your signs and symptoms. You may be referred to a health care provider who specializes in eye conditions (ophthalmologist). You will have an eye exam. Tests may also be done, including:  Tonometry. This test checks the pressure inside your eye.  Refraction test. This test checks your prescription for corrective lenses.  Visual acuity test. This is the standard eye test in which you read letters on a chart.  Fluorescein angiography. This test checks the flow of blood in the retina.  Ophthalmoscopy. In this test, your health care provider examines the back of your eye.  Ultrasound. This test is an advanced scanning of the eye.  Color vision test. This test checks your ability to see colors.  Slit-lamp examination. This test checks the front of your eye. TREATMENT  Treatment for retinal detachment depends on your condition. Treatment may include:  Laser treatment. This may be the only treatment you need if your retina has only a small tear or hole and has not completely detached.  Freeze treatment (cryopexy). This treatment involves freezing the area around the hole to help in reattaching the retina.  Surgery. A variety of surgeries may be done for retinal detachment:  A small band (scleral buckle) may be used to push the eyeball against the retina. The retina is then reattached using lasers or cryopexy.  An incision may be made in the white  of the eye (sclera) to remove the gel inside it (vitrectomy). Gas is then injected into the sclera to push the eye back against the retina (pneumatic retinopexy). The retina is then reattached using lasers or cryopexy. HOME CARE INSTRUCTIONS   Wear eye protection to prevent injuries.  If you have diabetes, control your blood pressure.  Keep all follow-up visits as directed by your health  care provider. This is important. If you have had surgery to treat retinal detachment, follow your health care provider's instructions for home care. SEEK MEDICAL CARE IF:  You have complications after retinal detachment surgery.  Your vision is not improving as expected.  Your vision is getting worse. SEEK IMMEDIATE MEDICAL CARE IF:   You suddenly see flashing lights or floaters.  You suddenly have a dark area in your field of vision, especially in the lower part. This can lead to a loss of central vision.   This information is not intended to replace advice given to you by your health care provider. Make sure you discuss any questions you have with your health care provider.   Document Released: 08/11/2005 Document Revised: 05/02/2015 Document Reviewed: 06/21/2014 Elsevier Interactive Patient Education [employer].

## 2015-12-29 NOTE — ED Notes (Signed)
Pt sent here from eye doc for two retinal tears.

## 2016-04-15 ENCOUNTER — Ambulatory Visit (INDEPENDENT_AMBULATORY_CARE_PROVIDER_SITE_OTHER): Payer: 59 | Admitting: Orthopedic Surgery

## 2016-04-15 ENCOUNTER — Encounter: Payer: Self-pay | Admitting: Orthopedic Surgery

## 2016-04-15 ENCOUNTER — Ambulatory Visit (INDEPENDENT_AMBULATORY_CARE_PROVIDER_SITE_OTHER): Payer: 59

## 2016-04-15 VITALS — BP 125/84 | HR 79 | Ht 72.0 in | Wt 204.0 lb

## 2016-04-15 DIAGNOSIS — M75101 Unspecified rotator cuff tear or rupture of right shoulder, not specified as traumatic: Secondary | ICD-10-CM

## 2016-04-15 DIAGNOSIS — M25511 Pain in right shoulder: Secondary | ICD-10-CM | POA: Diagnosis not present

## 2016-04-15 NOTE — Patient Instructions (Signed)

## 2016-04-15 NOTE — Progress Notes (Signed)
Chief Complaint  Patient presents with  . Shoulder Pain    right shoulder pain, no injuru   HPI   60 year old male presents for evaluation of right shoulder pain ongoing for several months. Apparently he was scrubbing the floor in his garage and sometime after that started having right shoulder pain. The pain is over the right deltoid and glenohumeral joint, associated with no radiation into the hand or neck, associated with forward elevation but no real weakness or instability  Review of Systems  Constitutional: Negative for chills, fever and weight loss.  Respiratory: Negative for shortness of breath.   Cardiovascular: Negative for chest pain.  Musculoskeletal: Positive for back pain and joint pain.  Neurological: Negative for tingling.    Past Medical History:  Diagnosis Date  . Depression   . Hypertension     Past Surgical History:  Procedure Laterality Date  . BACK SURGERY    . broken nose    . CATARACT EXTRACTION W/PHACO Right 11/01/2012   Procedure: CATARACT EXTRACTION PHACO AND INTRAOCULAR LENS PLACEMENT (IOC);  Surgeon: Tonny Branch, MD;  Location: [facility];  Service: Ophthalmology;  Laterality: Right;  CDE=7.50  . CATARACT EXTRACTION W/PHACO Left 11/15/2012   Procedure: CATARACT EXTRACTION PHACO AND INTRAOCULAR LENS PLACEMENT (IOC);  Surgeon: Tonny Branch, MD;  Location: [facility];  Service: Ophthalmology;  Laterality: Left;  CDE 6.68  . GANGLION CYST EXCISION     left wrist  . left bicep repair    . NASAL SEPTUM SURGERY    . ROTATOR CUFF REPAIR     right  . TONSILLECTOMY     No family history on file. Social History  Substance Use Topics  . Smoking status: Never Smoker  . Smokeless tobacco: Not on file  . Alcohol use Yes     Comment: social    Current Outpatient Prescriptions:  .  aspirin EC 81 MG tablet, Take 81 mg by mouth daily., Disp: , Rfl:  .  citalopram (CELEXA) 20 MG tablet, Take 20 mg by mouth daily., Disp: , Rfl:  .  fenofibrate (TRICOR) 48 MG tablet, Take  48 mg by mouth daily., Disp: , Rfl:  .  lamoTRIgine (LAMICTAL) 100 MG tablet, Take 100 mg by mouth daily., Disp: , Rfl:  .  lisdexamfetamine (VYVANSE) 40 MG capsule, Take 40 mg by mouth daily., Disp: , Rfl:  .  lisinopril-hydrochlorothiazide (PRINZIDE,ZESTORETIC) 10-12.5 MG per tablet, Take 1 tablet by mouth daily., Disp: , Rfl:  .  simvastatin (ZOCOR) 40 MG tablet, Take 40 mg by mouth every evening., Disp: , Rfl:   BP 125/84   Pulse 79   Ht 6' (1.829 m)   Wt 204 lb (92.5 kg)   BMI 27.67 kg/m   Physical Exam  Constitutional: He is oriented to person, place, and time. He appears well-developed and well-nourished. No distress.  Cardiovascular: Normal rate and intact distal pulses.   Neurological: He is alert and oriented to person, place, and time.  Skin: Skin is warm and dry. No rash noted. He is not diaphoretic. No erythema. No pallor.  Psychiatric: He has a normal mood and affect. His behavior is normal. Judgment and thought content normal.    Ortho Exam  Gait is undisturbed with normal heel to toe pattern  Left shoulder: Normal alignment no tenderness or swelling, full range of motion, normal strength grade 5. No instability in abduction external rotation.  Right shoulder normal alignment mild tenderness over the anterior joint line without swelling. He still exhibits full range  of motion including internal rotation with normal grade 5 strength with pain on resisted abduction in the scapular plane and with flexion and the scapular plane. Stability tests were normal  Neck was nontender  The patient had full range of motion of both hips without pain  He has an incision on his lower back with tenderness in the left SI joint and left gluteal area which accounts for his back pain. No radicular symptoms were noted.   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 He had some greater tuberosity  sclerosis consistent with some chronic rotator cuff changes consistent with his age of 48  The back/hip pain is related to his lower back nondistended hip joint as his hip exam was fully normal    PLAN Injection of the subacromial space  He is encouraged to use heat, ibuprofen and continue walking for his lower back. If he has any leg symptoms we can certainly work him up for that and get him to the appropriate neurosurgeon if necessary   Procedure note the subacromial injection shoulder RIGHT  Verbal consent was obtained to inject the  RIGHT   Shoulder  Timeout was completed to confirm the injection site is a subacromial space of the  RIGHT  shoulder   Medication used Depo-Medrol 40 mg and lidocaine 1% 3 cc  Anesthesia was provided by ethyl chloride  The injection was performed in the RIGHT  posterior subacromial space. After pinning the skin with alcohol and anesthetized the skin with ethyl chloride the subacromial space was injected using a 20-gauge needle. There were no complications  Sterile dressing was applied.    Arther Abbott, MD 04/15/2016 12:04 PM

## 2016-05-02 ENCOUNTER — Telehealth: Payer: Self-pay | Admitting: Orthopedic Surgery

## 2016-05-02 NOTE — Telephone Encounter (Signed)
Patient stopped by office regarding continued shoulder pain for which he was treated 04/15/16.  We have scheduled a follow up appointment for Monday, 05/05/16.  Any further recommendations?

## 2016-05-02 NOTE — Telephone Encounter (Signed)
ICE AND TYLENOL, WILL REVIEW FURTHER AT TIME OF OFFICE VISIT

## 2016-05-02 NOTE — Telephone Encounter (Signed)
Called patient, relayed via voice message.

## 2016-05-05 ENCOUNTER — Ambulatory Visit: Payer: 59 | Admitting: Orthopedic Surgery

## 2016-05-29 ENCOUNTER — Other Ambulatory Visit (HOSPITAL_COMMUNITY): Payer: Self-pay | Admitting: Preventative Medicine

## 2016-05-29 DIAGNOSIS — M5416 Radiculopathy, lumbar region: Secondary | ICD-10-CM

## 2016-06-03 ENCOUNTER — Ambulatory Visit (HOSPITAL_COMMUNITY): Admission: RE | Admit: 2016-06-03 | Payer: 59 | Source: Ambulatory Visit

## 2016-06-11 ENCOUNTER — Ambulatory Visit (HOSPITAL_COMMUNITY): Payer: 59 | Attending: Orthopedic Surgery | Admitting: Specialist

## 2016-06-11 ENCOUNTER — Encounter (HOSPITAL_COMMUNITY): Payer: Self-pay | Admitting: Specialist

## 2016-06-11 DIAGNOSIS — R29898 Other symptoms and signs involving the musculoskeletal system: Secondary | ICD-10-CM | POA: Diagnosis present

## 2016-06-11 DIAGNOSIS — M25611 Stiffness of right shoulder, not elsewhere classified: Secondary | ICD-10-CM | POA: Insufficient documentation

## 2016-06-11 DIAGNOSIS — M25511 Pain in right shoulder: Secondary | ICD-10-CM | POA: Diagnosis not present

## 2016-06-11 NOTE — Therapy (Signed)
Jacob Arellano, [place], [address] Phone: [phone]   Fax:  [phone]  Occupational Therapy Evaluation  Patient Details  Name: Jacob Arellano MRN: [id number] Date of Birth: May 01, 1956 Referring Provider: Dr. Victorino December ([place])  Encounter Date: 06/11/2016      OT End of Session - 06/11/16 1706    Visit Number 1   Number of Visits 24   Date for OT Re-Evaluation 08/10/16  mini reassess on 07/10/16   Authorization Type [employer]    Authorization Time Period 20 visit limit, 0 used   Authorization - Visit Number 1   Authorization - Number of Visits 20   OT Start Time 1430   OT Stop Time 1515   OT Time Calculation (min) 45 min   Activity Tolerance Patient tolerated treatment well   Behavior During Therapy [facility] for tasks assessed/performed      Past Medical History:  Diagnosis Date  . Depression   . Hypertension     Past Surgical History:  Procedure Laterality Date  . BACK SURGERY    . broken nose    . CATARACT EXTRACTION W/PHACO Right 11/01/2012   Procedure: CATARACT EXTRACTION PHACO AND INTRAOCULAR LENS PLACEMENT (IOC);  Surgeon: Tonny Branch, MD;  Location: [facility];  Service: Ophthalmology;  Laterality: Right;  CDE=7.50  . CATARACT EXTRACTION W/PHACO Left 11/15/2012   Procedure: CATARACT EXTRACTION PHACO AND INTRAOCULAR LENS PLACEMENT (IOC);  Surgeon: Tonny Branch, MD;  Location: [facility];  Service: Ophthalmology;  Laterality: Left;  CDE 6.68  . GANGLION CYST EXCISION     left wrist  . left bicep repair    . NASAL SEPTUM SURGERY    . ROTATOR CUFF REPAIR     right  . TONSILLECTOMY      There were no vitals filed for this visit.      Subjective Assessment - 06/11/16 1652    Subjective  S:  I fell the other night and landed on my shoulder.  It hasnt felt bad or anything since then.    Pertinent History Jacob Arellano is a 60 year old male who had right rotator cuff repair on 06/04/16.  He  presents today with his right arm in a sling.  He has been referred to occupational therapy for evaluation and treatment following standard rotator cuff protoclol.     Patient Stated Goals I want to regain all of my ROM and strength   Currently in Pain? Yes   Pain Score 3    Pain Location Shoulder   Pain Orientation Right   Pain Type Acute pain   Pain Radiating Towards elbow   Pain Onset More than a month ago   Pain Frequency Intermittent   Aggravating Factors  movement   Pain Relieving Factors rest   Effect of Pain on Daily Activities decreased use of dominant arm with functional activities            [facility] OT Assessment - 06/11/16 0001      Assessment   Diagnosis S/P Right RCR   Referring Provider Dr. Victorino December  [facility]   Onset Date 06/04/16   Prior Therapy n/a     Precautions   Precautions Shoulder   Type of Shoulder Precautions sling, standard protocol:  PROM for four weeks (through 07/02/16), AA/ROM from 07/02/16 -07/16/16, begin A/ROM on 07/16/16 and gradually progress to PRE   Precaution Comments requested specific protocol from Dr. Stann Mainland     Restrictions  Weight Bearing Restrictions No     Balance Screen   Has the patient fallen in the past 6 months Yes  blacking out and falling   How many times? 2   Has the patient had a decrease in activity level because of a fear of falling?  No   Is the patient reluctant to leave their home because of a fear of falling?  No     Home  Environment   Family/patient expects to be discharged to: Private residence   Living Arrangements Alone   Available Help at Discharge Family   Type of [place]   Level of [place] Full time employment   Vocation Requirements [employer], Engineer, structural  mostly computer work   Leisure enjoys baking, cooking, exercising, reading, and yardwork     ADL   ADL comments unable  to use his right arm as dominant.  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     Cognition   Overall Cognitive Status Within Functional Limits for tasks assessed     Observation/Other Assessments   Skin Integrity 4 scope incisions healing as expected    Focus on Therapeutic Outcomes (FOTO)  18/100     Sensation   Light Touch Appears Intact     Coordination   Gross Motor Movements are Fluid and Coordinated Yes   Fine Motor Movements are Fluid and Coordinated Yes     ROM / Strength   AROM / PROM / Strength AROM;PROM;Strength     Palpation   Palpation comment mod-max fascial restrictions in right upper arm and shoulder region      AROM   Overall AROM Comments testing deferred due to recent surgery   AROM Assessment Site Shoulder   Right/Left Shoulder Right     PROM   Overall PROM Comments assessed in supine, external and internal rotation with shoulder adducted   PROM Assessment Site Shoulder   Right/Left Shoulder Right   Right Shoulder Flexion 80 Degrees   Right Shoulder ABduction 70 Degrees   Right Shoulder Internal Rotation 70 Degrees   Right Shoulder External Rotation 65 Degrees     Strength   Overall Strength Comments testing deferred due to recent surgery   Strength Assessment Site Shoulder   Right/Left Shoulder Right                  OT Treatments/Exercises (OP) - 06/11/16 0001      Manual Therapy   Manual Therapy Myofascial release   Manual therapy comments manual therapy completed seperately from all other interventions this date    Myofascial Release myofascial release and manual stretching to right upper arm, scapular and shoulder region to decrease pain and improve pain free mobility.                OT Education - 06/11/16 1706    Education provided Yes    Education Details educated on towel slides and elbow-wrist A/ROM.     Person(s) Educated Patient   Methods Explanation;Handout;Demonstration   Comprehension Verbalized understanding;Returned demonstration          OT Short Term Goals - 06/11/16 2142  OT SHORT TERM GOAL #1   Title Patient will be educated on a HEP for right shoulder mobility.   Time 6   Period Weeks   Status New     OT SHORT TERM GOAL #2   Title Patient will have WFL P/ROM in his right shoulder for increased ability to don and doff clothing.    Time 6   Period Weeks   Status New     OT SHORT TERM GOAL #3   Title Patient will have 3+/5 strength in his right shoulder for increased ability to reach above waist height.    Time 6   Period Weeks   Status New     OT SHORT TERM GOAL #4   Title Patient will have decreased pain in his right shoulder to 2/10 or better with activity.    Time 6   Period Weeks   Status New     OT SHORT TERM GOAL #5   Title Patient will decrease fascial restrictions to moderate in his right shoulder region for greater mobility needed for ADL completion.    Time 6   Period Weeks   Status New           OT Long Term Goals - 06/11/16 2145      OT LONG TERM GOAL #1   Title Patient will use his right arm as dominant with all B/IADLs, work, and leisure activities.     Time 12   Period Weeks   Status New     OT LONG TERM GOAL #2   Title Patient will decrease pain in his right shoulder to 1/10 or better when reaching overhead.   Time 12   Period Weeks   Status New     OT LONG TERM GOAL #3   Title Patient will decrease fascial restrictions in his right shoulder to minimal for improved mobility needed to use right arm as dominant.    Time 12   Period Weeks   Status New     OT LONG TERM GOAL #4   Title Patient will improve right shoulder A/ROM to WNL for increased ability to reach into overhead cabinets.    Time 12   Period Weeks   Status New     OT LONG TERM GOAL  #5   Title Patient will improve right shoulder strength to 5/5 for improved ability to lift cakes out of oven and use garden equipment.    Time 12   Period Weeks   Status New               Plan - 06/11/16 2141    Clinical Impression Statement A: Patient is a 60 year old male with past medical history that includes right rotator cuff surgery 18 years ago, detached retina, disc surgery, hypertension who underwent right rotator cuff repair on 06/04/16. Patient is right hand dominant and is not able to use his right arm actively with daily, work, or leisure activities at this time.     Rehab Potential Good   OT Frequency 2x / week   OT Duration 12 weeks   OT Treatment/Interventions Self-care/ADL training;Cryotherapy;Ultrasound;Moist Heat;Iontophoresis;Electrical Stimulation;Neuromuscular education;Therapeutic exercise;Manual Therapy;Passive range of motion;Scar mobilization;Therapeutic exercises;Therapeutic activities;Patient/family education   Plan P: Skilled OT intervention to improve right shoulder ROM and strength while decreasing pain and fascial restrictions in order to return to use of right arm as dominant with all B/IADLs, work, and leisure activities. Next visit: Manual therapy and P/ROM, review HEP, isometrics, ball stretches.  OT Home Exercise Plan 06/11/16 towel slides    Consulted and Agree with Plan of Care Patient      Patient will benefit from skilled therapeutic intervention in order to improve the following deficits and impairments:  Decreased range of motion, Decreased skin integrity, Decreased scar mobility, Decreased strength, Increased muscle spasms, Increased fascial restricitons, Impaired UE functional use, Pain  Visit Diagnosis: Acute pain of right shoulder - Plan: Ot plan of care cert/re-cert  Stiffness of right shoulder, not elsewhere classified - Plan: Ot plan of care cert/re-cert  Other symptoms and signs involving the musculoskeletal system - Plan: Ot  plan of care cert/re-cert    Problem List There are no active problems to display for this patient.   Vangie Bicker, [place], OTR/L [phone]  06/11/2016, 9:54 PM  [facility] [address] [place], [place], [address] Phone: [phone]   Fax:  [phone]  Name: NABOR NEESON MRN: [id number] Date of Birth: 06/25/1956

## 2016-06-11 NOTE — Patient Instructions (Signed)
TOWEL SLIDES COMPLETE FOR 1-3 MINUTES, 3-5 TIMES PER DAY  SHOULDER: Flexion On Table   Place hands on towel on table, elbows straight. Slide arms away from body, leaning forward as able.   Abduction (Passive)   With arm out to side palm down, resting on towel on table, Slide arm across table. Copyright  VHI. All rights reserved.     Internal Rotation (Assistive)   Seated with elbow bent at right angle and held against side, slide arm on table surface in an inward arc.  Activity: Use this motion to brush crumbs off the table.  Copyright  VHI. All rights reserved.       Copyright  VHI. All rights reserved.  AROM: Wrist Extension   With right palm down, bend wrist up. Repeat 10____ times per set. Do ____ sets per session. Do __3__ sessions per day.  Copyright  VHI. All rights reserved.   AROM: Wrist Flexion   With right palm up, bend wrist up. Repeat ___10_ times per set. Do ____ sets per session. Do __3__ sessions per day.  Copyright  VHI. All rights reserved.   AROM: Forearm Pronation / Supination   With right arm in handshake position, slowly rotate palm down until stretch is felt. Relax. Then rotate palm up until stretch is felt. Repeat __10__ times per set. Do ____ sets per session. Do __3__ sessions per day.  Copyright  VHI. All rights reserved.   AROM: Elbow Flexion / Extension    With left hand palm up, gently bend elbow as far as possible. Then straighten arm as far as possible. Repeat ____ times per set. Do ____ sets per session. Do ____ sessions per day.  Copyright  VHI. All rights reserved.

## 2016-06-12 ENCOUNTER — Ambulatory Visit (HOSPITAL_COMMUNITY)
Admission: RE | Admit: 2016-06-12 | Discharge: 2016-06-12 | Disposition: A | Discharge status: Home / Self Care | Payer: 59 | Source: Ambulatory Visit | Attending: Preventative Medicine | Admitting: Preventative Medicine

## 2016-06-12 DIAGNOSIS — M2578 Osteophyte, vertebrae: Secondary | ICD-10-CM | POA: Diagnosis not present

## 2016-06-12 DIAGNOSIS — M5127 Other intervertebral disc displacement, lumbosacral region: Secondary | ICD-10-CM | POA: Insufficient documentation

## 2016-06-12 DIAGNOSIS — M5416 Radiculopathy, lumbar region: Secondary | ICD-10-CM

## 2016-06-12 DIAGNOSIS — M5126 Other intervertebral disc displacement, lumbar region: Secondary | ICD-10-CM | POA: Insufficient documentation

## 2016-06-12 LAB — POCT I-STAT CREATININE: CREATININE: 1.3 mg/dL — AB (ref 0.61–1.24)

## 2016-06-12 MED ORDER — GADOBENATE DIMEGLUMINE 529 MG/ML IV SOLN
20.0000 mL | Freq: Once | INTRAVENOUS | Status: AC | PRN
  Administered 2016-06-12: 20 mL via INTRAVENOUS

## 2016-06-16 ENCOUNTER — Ambulatory Visit (HOSPITAL_COMMUNITY): Payer: 59 | Admitting: Specialist

## 2016-06-16 ENCOUNTER — Telehealth (HOSPITAL_COMMUNITY): Payer: Self-pay | Admitting: Specialist

## 2016-06-16 NOTE — Telephone Encounter (Signed)
Pt at [facility] and has not been seen yet he will call to keep [place] updated.He hopes he can keep this apptment. 8:22 am NF

## 2016-06-16 NOTE — Telephone Encounter (Signed)
Stuck in traffic and can not make it here from MD office

## 2016-06-17 ENCOUNTER — Ambulatory Visit (HOSPITAL_COMMUNITY): Payer: 59 | Admitting: Occupational Therapy

## 2016-06-17 ENCOUNTER — Encounter (HOSPITAL_COMMUNITY): Payer: Self-pay | Admitting: Occupational Therapy

## 2016-06-17 DIAGNOSIS — M25511 Pain in right shoulder: Secondary | ICD-10-CM

## 2016-06-17 DIAGNOSIS — M25611 Stiffness of right shoulder, not elsewhere classified: Secondary | ICD-10-CM

## 2016-06-17 DIAGNOSIS — R29898 Other symptoms and signs involving the musculoskeletal system: Secondary | ICD-10-CM

## 2016-06-17 NOTE — Patient Instructions (Signed)
  For exercises 1-5, complete 3-5x, holding for 5 seconds. 1-2x/day  1) Extension .Hold your arm against your side, with your elbow at a 90-degree angle. Without moving your body, push your arm back into the wall (can push against pillow). You should feel your shoulder muscles contract. Repeat contract and relax motion.    2) Abduction Hold your arm against your side, with your elbow at a 90-degree angle. Without moving your body, push your arm into the wall (can push against pillow). You should feel your shoulder muscles contract. Repeat contract and relax motion.    3) External Rotation Keep your elbow locked by your side at 90 degrees, place your hand against a wall at a corner with the back of your hand against the wall. Push outward into the wall at 50% of of your strength by rotating your arm out and pushing the back of your hand into the wall. Use a pillow or roll up a towel to push into.    4) Internal Rotation  Keep your elbow locked by your side at 90 degrees, place your hand against a wall at a corner as pictured. Push into the wall at 50% of your strength by rotating your arm and pushing the palm of your hand into the wall. Use a pillow or roll up a towel to push into.   5) Flexion Standing up, place a towel between your arm/wrist and the wall. Press your arm forward into the wall gently, without moving your shoulder.       Complete exercises 6 & 7 10X each, 1-2x/day   6) Retraction     7) Shoulder Elevation    Sit up straight with arms by your sides. Slowly bring your shoulders up towards your ears.

## 2016-06-17 NOTE — Therapy (Signed)
[place], [place], [address] Phone: [phone]   Fax:  [phone]  Occupational Therapy Treatment  Patient Details  Name: Jacob Arellano MRN: [id number] Date of Birth: 24-Jan-1956 Referring Provider: Dr. Victorino December ([place])  Encounter Date: 06/17/2016      OT End of Session - 06/17/16 1601    Visit Number 2   Number of Visits 24   Date for OT Re-Evaluation 08/10/16  mini reassess on 07/10/16   Authorization Type UHC    Authorization Time Period 20 visit limit, 0 used   Authorization - Visit Number 2   Authorization - Number of Visits 20   OT Start Time 1520   OT Stop Time 1550   OT Time Calculation (min) 30 min   Activity Tolerance Patient tolerated treatment well   Behavior During Therapy [facility] for tasks assessed/performed      Past Medical History:  Diagnosis Date  . Depression   . Hypertension     Past Surgical History:  Procedure Laterality Date  . BACK SURGERY    . broken nose    . CATARACT EXTRACTION W/PHACO Right 11/01/2012   Procedure: CATARACT EXTRACTION PHACO AND INTRAOCULAR LENS PLACEMENT (IOC);  Surgeon: Tonny Branch, MD;  Location: [facility];  Service: Ophthalmology;  Laterality: Right;  CDE=7.50  . CATARACT EXTRACTION W/PHACO Left 11/15/2012   Procedure: CATARACT EXTRACTION PHACO AND INTRAOCULAR LENS PLACEMENT (IOC);  Surgeon: Tonny Branch, MD;  Location: [facility];  Service: Ophthalmology;  Laterality: Left;  CDE 6.68  . GANGLION CYST EXCISION     left wrist  . left bicep repair    . NASAL SEPTUM SURGERY    . ROTATOR CUFF REPAIR     right  . TONSILLECTOMY      There were no vitals filed for this visit.      Subjective Assessment - 06/17/16 1520    Subjective  S: The doctor said we're going to take everything really slow.    Currently in Pain? No/denies            [facility] OT Assessment - 06/17/16 1520      Assessment   Diagnosis S/P Right RCR     Precautions    Precautions Shoulder   Type of Shoulder Precautions Dr. Stann Mainland protocol: weeks 0-4 (10/11-11/8) active scapular retraction, isometrics, elbow/wrist/hand A/ROM. Weeks 5-8 (10/9-12/6) pendulums, P/ROM. Weeks 9-12 (12/7-1/3) AA/ROM progressing to A/ROM. Week 13 (1/4) add resistance and progress to strengthening as tolerated.    Required Braces or Orthoses Sling                  OT Treatments/Exercises (OP) - 06/17/16 1523      Exercises   Exercises Shoulder     Shoulder Exercises: Seated   Elevation AROM;10 reps   Retraction AROM;10 reps   Retraction Limitations arms adducted     Shoulder Exercises: Isometric Strengthening   Flexion Supine;3X3"   Extension Supine;3X3"   External Rotation Supine;3X3"   Internal Rotation Supine;3X3"   ABduction Supine;3X3"   ADduction Supine;3X3"     Manual Therapy   Manual Therapy Myofascial release   Manual therapy comments manual therapy completed seperately from all other interventions this date    Myofascial Release myofascial release right upper arm, scapular and shoulder region to decrease pain and improve pain free mobility.                 OT Education - 06/17/16 1600  Education provided Yes   Education Details educated pt to discontinue towel slides, provided isometric shoulder exercises for HEP   Person(s) Educated Patient   Methods Explanation;Demonstration;Handout   Comprehension Verbalized understanding;Returned demonstration          OT Short Term Goals - 06/17/16 1605      OT SHORT TERM GOAL #1   Title Patient will be educated on a HEP for right shoulder mobility.   Time 6   Period Weeks   Status On-going     OT SHORT TERM GOAL #2   Title Patient will have WFL P/ROM in his right shoulder for increased ability to don and doff clothing.    Time 6   Period Weeks   Status On-going     OT SHORT TERM GOAL #3   Title Patient will have 3+/5 strength in his right shoulder for increased ability to reach  above waist height.    Time 6   Period Weeks   Status On-going     OT SHORT TERM GOAL #4   Title Patient will have decreased pain in his right shoulder to 2/10 or better with activity.    Time 6   Period Weeks   Status On-going     OT SHORT TERM GOAL #5   Title Patient will decrease fascial restrictions to moderate in his right shoulder region for greater mobility needed for ADL completion.    Time 6   Period Weeks   Status On-going           OT Long Term Goals - 06/17/16 1605      OT LONG TERM GOAL #1   Title Patient will use his right arm as dominant with all B/IADLs, work, and leisure activities.     Time 12   Period Weeks   Status On-going     OT LONG TERM GOAL #2   Title Patient will decrease pain in his right shoulder to 1/10 or better when reaching overhead.   Time 12   Period Weeks   Status On-going     OT LONG TERM GOAL #3   Title Patient will decrease fascial restrictions in his right shoulder to minimal for improved mobility needed to use right arm as dominant.    Time 12   Period Weeks   Status On-going     OT LONG TERM GOAL #4   Title Patient will improve right shoulder A/ROM to WNL for increased ability to reach into overhead cabinets.    Time 12   Period Weeks   Status On-going     OT LONG TERM GOAL #5   Title Patient will improve right shoulder strength to 5/5 for improved ability to lift cakes out of oven and use garden equipment.    Time 12   Period Weeks   Status On-going               Plan - 06/17/16 1601    Clinical Impression Statement A: Initiated myofascial release, isometrics, and scapular retraction with RUE adducted. Per MD protocol, no passive stretching or pendulums until 5 weeks post-op. Instructed pt to discontinue table slide HEP, provided isometrics for updated HEP.    Plan P: Review isometric HEP with pt performing according to HEP pictures. Discuss visit limit with pt and extended timeframe and decide if pt would like  to come 1x/week until week 5 when P/ROM can be started.    OT Home Exercise Plan 06/11/16 towel slides; 10/24 isometrics, instructed pt  to discontinue table slides   Consulted and Agree with Plan of Care Patient      Patient will benefit from skilled therapeutic intervention in order to improve the following deficits and impairments:  Decreased range of motion, Decreased skin integrity, Decreased scar mobility, Decreased strength, Increased muscle spasms, Increased fascial restricitons, Impaired UE functional use, Pain  Visit Diagnosis: Acute pain of right shoulder  Stiffness of right shoulder, not elsewhere classified  Other symptoms and signs involving the musculoskeletal system    Problem List There are no active problems to display for this patient.  Guadelupe Sabin, OTR/L  [phone] 06/17/2016, 4:06 PM  [facility] [address] [place], [place], [address] Phone: [phone]   Fax:  [phone]  Name: Jacob Arellano MRN: [id number] Date of Birth: [birth year]

## 2016-06-18 ENCOUNTER — Ambulatory Visit (HOSPITAL_COMMUNITY): Payer: 59

## 2016-06-18 ENCOUNTER — Encounter (HOSPITAL_COMMUNITY): Payer: Self-pay

## 2016-06-18 DIAGNOSIS — R29898 Other symptoms and signs involving the musculoskeletal system: Secondary | ICD-10-CM

## 2016-06-18 DIAGNOSIS — M25511 Pain in right shoulder: Secondary | ICD-10-CM

## 2016-06-18 DIAGNOSIS — M25611 Stiffness of right shoulder, not elsewhere classified: Secondary | ICD-10-CM

## 2016-06-18 NOTE — Therapy (Signed)
[place], [place], [address] Phone: [phone]   Fax:  [phone]  Occupational Therapy Treatment  Patient Details  Name: Jacob Arellano MRN: [id number] Date of Birth: [birth year] Referring Provider: Dr. Victorino December ([place])  Encounter Date: 06/18/2016      OT End of Session - 06/18/16 1517    Visit Number 3   Number of Visits 24   Date for OT Re-Evaluation 08/10/16  mini reassess on 07/10/16   Authorization Type UHC    Authorization Time Period 20 visit limit, 0 used   Authorization - Visit Number 3   Authorization - Number of Visits 20   OT Start Time 1435   OT Stop Time 1515   OT Time Calculation (min) 40 min   Activity Tolerance Patient tolerated treatment well   Behavior During Therapy [facility] for tasks assessed/performed      Past Medical History:  Diagnosis Date  . Depression   . Hypertension     Past Surgical History:  Procedure Laterality Date  . BACK SURGERY    . broken nose    . CATARACT EXTRACTION W/PHACO Right 11/01/2012   Procedure: CATARACT EXTRACTION PHACO AND INTRAOCULAR LENS PLACEMENT (IOC);  Surgeon: Tonny Branch, MD;  Location: [facility];  Service: Ophthalmology;  Laterality: Right;  CDE=7.50  . CATARACT EXTRACTION W/PHACO Left 11/15/2012   Procedure: CATARACT EXTRACTION PHACO AND INTRAOCULAR LENS PLACEMENT (IOC);  Surgeon: Tonny Branch, MD;  Location: [facility];  Service: Ophthalmology;  Laterality: Left;  CDE 6.68  . GANGLION CYST EXCISION     left wrist  . left bicep repair    . NASAL SEPTUM SURGERY    . ROTATOR CUFF REPAIR     right  . TONSILLECTOMY      There were no vitals filed for this visit.      Subjective Assessment - 06/18/16 1513    Subjective  S: I don't even feel like the the doctor operated on me.    Currently in Pain? No/denies            [facility] OT Assessment - 06/18/16 1514      Assessment   Diagnosis S/P Right RCR     Precautions   Precautions Shoulder   Type of Shoulder Precautions Dr. Stann Mainland protocol: weeks 0-4 (10/11-11/8) active scapular retraction, isometrics, elbow/wrist/hand A/ROM. Weeks 5-8 (10/9-12/6) pendulums, P/ROM. Weeks 9-12 (12/7-1/3) AA/ROM progressing to A/ROM. Week 13 (1/4) add resistance and progress to strengthening as tolerated.    Required Braces or Orthoses Sling                  OT Treatments/Exercises (OP) - 06/18/16 1514      Exercises   Exercises Shoulder     Shoulder Exercises: Standing   Retraction AROM;10 reps  shoulder adducted   Shoulder Elevation AROM;10 reps     Shoulder Exercises: Isometric Strengthening   Flexion 5X5"  standing   Extension 5X5"  standing   External Rotation 5X5"  standing   Internal Rotation 5X5"  standing   ABduction 5X5"  standing   ADduction 5X5"  standing     Manual Therapy   Manual Therapy Myofascial release   Manual therapy comments manual therapy completed seperately from all other interventions this date    Myofascial Release myofascial release right upper arm, scapular and shoulder region to decrease pain and improve pain free mobility.  OT Education - 06/18/16 1516    Education provided Yes   Education Details Reviewed HEP. Discussed insurance visit limit and recommended patient wait until week 5 before returning to therapy. Encouraged patient to call and schedule another appointment if he feels he needs to come in prior to 11/15.   Person(s) Educated Patient   Methods Explanation;Demonstration;Verbal cues;Handout   Comprehension Returned demonstration;Verbalized understanding          OT Short Term Goals - 06/17/16 1605      OT SHORT TERM GOAL #1   Title Patient will be educated on a HEP for right shoulder mobility.   Time 6   Period Weeks   Status On-going     OT SHORT TERM GOAL #2   Title Patient will have WFL P/ROM in his right shoulder for increased ability to don and doff clothing.     Time 6   Period Weeks   Status On-going     OT SHORT TERM GOAL #3   Title Patient will have 3+/5 strength in his right shoulder for increased ability to reach above waist height.    Time 6   Period Weeks   Status On-going     OT SHORT TERM GOAL #4   Title Patient will have decreased pain in his right shoulder to 2/10 or better with activity.    Time 6   Period Weeks   Status On-going     OT SHORT TERM GOAL #5   Title Patient will decrease fascial restrictions to moderate in his right shoulder region for greater mobility needed for ADL completion.    Time 6   Period Weeks   Status On-going           OT Long Term Goals - 06/17/16 1605      OT LONG TERM GOAL #1   Title Patient will use his right arm as dominant with all B/IADLs, work, and leisure activities.     Time 12   Period Weeks   Status On-going     OT LONG TERM GOAL #2   Title Patient will decrease pain in his right shoulder to 1/10 or better when reaching overhead.   Time 12   Period Weeks   Status On-going     OT LONG TERM GOAL #3   Title Patient will decrease fascial restrictions in his right shoulder to minimal for improved mobility needed to use right arm as dominant.    Time 12   Period Weeks   Status On-going     OT LONG TERM GOAL #4   Title Patient will improve right shoulder A/ROM to WNL for increased ability to reach into overhead cabinets.    Time 12   Period Weeks   Status On-going     OT LONG TERM GOAL #5   Title Patient will improve right shoulder strength to 5/5 for improved ability to lift cakes out of oven and use garden equipment.    Time 12   Period Weeks   Status On-going               Plan - 06/18/16 1517    Clinical Impression Statement A: Reviewed updated HEP with verbal cues for form and technique. Pt with very limited fascial restrictions and reports of no pain. Discussed patient's insurance coverage and MD protocol and recommended that patient return at week 5.    Plan P: Mini reassessment. Progress to Phase 1 of Dr. Dennie Maizes protocol. Begin P/ROM within recommended range.  Patient will benefit from skilled therapeutic intervention in order to improve the following deficits and impairments:  Decreased range of motion, Decreased skin integrity, Decreased scar mobility, Decreased strength, Increased muscle spasms, Increased fascial restricitons, Impaired UE functional use, Pain  Visit Diagnosis: Acute pain of right shoulder  Stiffness of right shoulder, not elsewhere classified  Other symptoms and signs involving the musculoskeletal system    Problem List There are no active problems to display for this patient.  Ailene Ravel, OTR/L,CBIS  [phone]  06/18/2016, 4:01 PM  [facility] [address] [place], [place], [address] Phone: [phone]   Fax:  [phone]  Name: Jacob Arellano MRN: [id number] Date of Birth: September 10, 1955

## 2016-07-09 ENCOUNTER — Encounter (HOSPITAL_COMMUNITY): Payer: Self-pay

## 2016-07-09 ENCOUNTER — Ambulatory Visit (HOSPITAL_COMMUNITY): Payer: 59 | Attending: Orthopedic Surgery

## 2016-07-09 DIAGNOSIS — M25611 Stiffness of right shoulder, not elsewhere classified: Secondary | ICD-10-CM | POA: Diagnosis present

## 2016-07-09 DIAGNOSIS — M25511 Pain in right shoulder: Secondary | ICD-10-CM | POA: Diagnosis present

## 2016-07-09 DIAGNOSIS — R29898 Other symptoms and signs involving the musculoskeletal system: Secondary | ICD-10-CM | POA: Diagnosis present

## 2016-07-09 NOTE — Patient Instructions (Signed)
1) Seated Row   Sit up straight with elbows by your sides. Pull back with shoulders/elbows, keeping forearms straight, as if pulling back on the reins of a horse. Squeeze shoulder blades together. Repeat _10-15__times, __2__sets/day    2) Shoulder Elevation    Sit up straight with arms by your sides. Slowly bring your shoulders up towards your ears. Repeat_10-15__times, __2__ sets/day    3) Shoulder Extension    Sit up straight with both arms by your side, draw your arms back behind your waist. Keep your elbows straight. Repeat _10-15___times, __2__sets/day.   Copyright  VHI. All rights reserved.    COMPLETE PENDULUM EXERCISES FOR 30 SECONDS TO A MINUTE EACH, 2 TIMES PER DAY. ROM: Pendulum (Side-to-Side)        Copyright  VHI. All rights reserved.  Pendulum Forward/Back    Copyright  VHI. All rights reserved.  Pendulum Circular     Do bicep curl. No weight. Do not move your shoulder. 10-15 reps.   SHOULDER: Flexion On Table   Place hands on table, elbows straight. Move hips away from body. Press hands down into table. Hold _2-3__ seconds. _10-15__ reps per set  Abduction (Passive) *Palm down on table  With arm out to side, resting on table, lower head toward arm, keeping trunk away from table. Hold __2-3__ seconds. Repeat _10-15___ times. Do _2___ sessions per day.  Copyright  VHI. All rights reserved.     Internal Rotation (Assistive)   Seated with elbow bent at right angle and held against side, slide arm on table surface in an inward arc. Repeat _10-15___ times. Do _2___ sessions per day. Activity: Use this motion to brush crumbs off the table.  Copyright  VHI. All rights reserved.

## 2016-07-09 NOTE — Therapy (Signed)
[place], [place], [address] Phone: [phone]   Fax:  [phone]  Occupational Therapy Treatment And reassessment Patient Details  Name: Jacob Arellano MRN: [id number] Date of Birth: June 19, 1956 Referring Provider: Dr. Victorino December ([place])  Encounter Date: 07/09/2016      OT End of Session - 07/09/16 1651    Visit Number 4   Number of Visits 24   Date for OT Re-Evaluation 08/10/16   Authorization Type UHC    Authorization Time Period 20 visit limit, 0 used   Authorization - Visit Number 4   Authorization - Number of Visits 20   OT Start Time 1520   OT Stop Time 1600   OT Time Calculation (min) 40 min   Activity Tolerance Patient tolerated treatment well   Behavior During Therapy [facility] for tasks assessed/performed      Past Medical History:  Diagnosis Date  . Depression   . Hypertension     Past Surgical History:  Procedure Laterality Date  . BACK SURGERY    . broken nose    . CATARACT EXTRACTION W/PHACO Right 11/01/2012   Procedure: CATARACT EXTRACTION PHACO AND INTRAOCULAR LENS PLACEMENT (IOC);  Surgeon: Tonny Branch, MD;  Location: [facility];  Service: Ophthalmology;  Laterality: Right;  CDE=7.50  . CATARACT EXTRACTION W/PHACO Left 11/15/2012   Procedure: CATARACT EXTRACTION PHACO AND INTRAOCULAR LENS PLACEMENT (IOC);  Surgeon: Tonny Branch, MD;  Location: [facility];  Service: Ophthalmology;  Laterality: Left;  CDE 6.68  . GANGLION CYST EXCISION     left wrist  . left bicep repair    . NASAL SEPTUM SURGERY    . ROTATOR CUFF REPAIR     right  . TONSILLECTOMY      There were no vitals filed for this visit.      Subjective Assessment - 07/09/16 1527    Subjective  S: When I'm at home and I'm just relaxing, I take this sling off. I just make sure I don't move my shoulder.    Currently in Pain? No/denies            [facility] OT Assessment - 07/09/16 1527      Assessment    Diagnosis S/P Right RCR     Precautions   Precautions Shoulder   Type of Shoulder Precautions Dr. Stann Mainland protocol:  (10/11-11/8) active scapular retraction, isometrics, elbow/wrist/hand A/ROM. Weeks 5-8 (10/9-12/6) pendulums, P/ROM. Weeks 9-12 (12/7-1/3) AA/ROM progressing to A/ROM. Week 13 (1/4) add resistance and progress to strengthening as tolerated.    Required Braces or Orthoses Sling     ROM / Strength   AROM / PROM / Strength PROM     AROM   Overall AROM  Unable to assess;Due to precautions     PROM   Overall PROM Comments assessed in supine, external and internal rotation with shoulder adducted   PROM Assessment Site Shoulder   Right/Left Shoulder Right   Right Shoulder Flexion 139 Degrees  previous: 80   Right Shoulder ABduction 180 Degrees  previous: 90   Right Shoulder Internal Rotation 90 Degrees  previous: 70   Right Shoulder External Rotation 48 Degrees  previous: 65     Strength   Overall Strength Unable to assess;Due to precautions                  OT Treatments/Exercises (OP) - 07/09/16 1644      Exercises   Exercises Shoulder;Elbow     Shoulder  Exercises: Supine   Protraction PROM;10 reps   Horizontal ABduction PROM;10 reps   External Rotation PROM;10 reps   Internal Rotation PROM;10 reps   Flexion PROM;10 reps   ABduction PROM;10 reps     Shoulder Exercises: Seated   Elevation AROM;10 reps   Extension AROM;10 reps   Row AROM;10 reps   Other Seated Exercises Table slides; 15 reps; flexion, abduction, IR/er     Shoulder Exercises: ROM/Strengthening   Pendulum side to side, forward/back, circiles, 30 seconds     Elbow Exercises   Other elbow exercises Flexion/extension; 15X; A/ROM     Manual Therapy   Manual Therapy Myofascial release   Manual therapy comments manual therapy completed seperately from all other interventions this date    Myofascial Release myofascial release and manual stretching to right upper arm, scapular and  shoulder region to decrease pain and improve pain free mobility.                 OT Education - 07/09/16 1650    Education provided Yes   Education Details table slides, pendulums, Elbow A/ROM, table slides   Person(s) Educated Patient   Methods Explanation;Demonstration;Verbal cues;Handout   Comprehension Returned demonstration;Verbalized understanding          OT Short Term Goals - 07/09/16 1659      OT SHORT TERM GOAL #1   Title Patient will be educated on a HEP for right shoulder mobility.   Time 6   Period Weeks   Status On-going     OT SHORT TERM GOAL #2   Title Patient will have WFL P/ROM in his right shoulder for increased ability to don and doff clothing.    Time 6   Period Weeks   Status Achieved     OT SHORT TERM GOAL #3   Title Patient will have 3+/5 strength in his right shoulder for increased ability to reach above waist height.    Time 6   Period Weeks   Status On-going     OT SHORT TERM GOAL #4   Title Patient will have decreased pain in his right shoulder to 2/10 or better with activity.    Time 6   Period Weeks   Status Achieved     OT SHORT TERM GOAL #5   Title Patient will decrease fascial restrictions to moderate in his right shoulder region for greater mobility needed for ADL completion.    Time 6   Period Weeks   Status Achieved           OT Long Term Goals - 06/17/16 1605      OT LONG TERM GOAL #1   Title Patient will use his right arm as dominant with all B/IADLs, work, and leisure activities.     Time 12   Period Weeks   Status On-going     OT LONG TERM GOAL #2   Title Patient will decrease pain in his right shoulder to 1/10 or better when reaching overhead.   Time 12   Period Weeks   Status On-going     OT LONG TERM GOAL #3   Title Patient will decrease fascial restrictions in his right shoulder to minimal for improved mobility needed to use right arm as dominant.    Time 12   Period Weeks   Status On-going      OT LONG TERM GOAL #4   Title Patient will improve right shoulder A/ROM to WNL for increased ability to reach into overhead  cabinets.    Time 12   Period Weeks   Status On-going     OT LONG TERM GOAL #5   Title Patient will improve right shoulder strength to 5/5 for improved ability to lift cakes out of oven and use garden equipment.    Time 12   Period Weeks   Status On-going               Plan - 07/09/16 1651    Clinical Impression Statement A: Reassessment completed this session. Patient has made great progress with all passive ROM measurements. Did lose some range with external rotation from initial evaluation. 3/5 short term goals have been met this date. patient is currently following protocol set by Dr. Dennie Maizes. HEP was updated this session and reviewed.   Plan P: Patient to complete therapy 1X a week until week 8 which he will begin 2X a week. Begin phase 2 at week 9.   OT Home Exercise Plan 06/11/16 towel slides; 10/24 isometrics, instructed pt to discontinue table slides. 11/15: table slides, elbow A/ROM, pendulums, seated scapular A/ROM      Patient will benefit from skilled therapeutic intervention in order to improve the following deficits and impairments:  Decreased range of motion, Decreased skin integrity, Decreased scar mobility, Decreased strength, Increased muscle spasms, Increased fascial restricitons, Impaired UE functional use, Pain  Visit Diagnosis: Stiffness of right shoulder, not elsewhere classified  Other symptoms and signs involving the musculoskeletal system    Problem List There are no active problems to display for this patient.  Ailene Ravel, OTR/L,CBIS  [phone]  07/09/2016, 5:04 PM  [facility] [address] [place], [place], [address] Phone: [phone]   Fax:  [phone]  Name: Jacob Arellano MRN: [id number] Date of Birth: 1956/07/03

## 2016-07-16 ENCOUNTER — Ambulatory Visit (HOSPITAL_COMMUNITY): Payer: 59 | Admitting: Occupational Therapy

## 2016-07-16 ENCOUNTER — Encounter (HOSPITAL_COMMUNITY): Payer: Self-pay | Admitting: Occupational Therapy

## 2016-07-16 DIAGNOSIS — M25611 Stiffness of right shoulder, not elsewhere classified: Secondary | ICD-10-CM

## 2016-07-16 DIAGNOSIS — R29898 Other symptoms and signs involving the musculoskeletal system: Secondary | ICD-10-CM

## 2016-07-16 DIAGNOSIS — M25511 Pain in right shoulder: Secondary | ICD-10-CM

## 2016-07-16 NOTE — Therapy (Signed)
[place], [place], [address] Phone: [phone]   Fax:  [phone]  Occupational Therapy Treatment  Patient Details  Name: Jacob Arellano MRN: [id number] Date of Birth: 03/14/1956 Referring Provider: Dr. Victorino December ([place])  Encounter Date: 07/16/2016      OT End of Session - 07/16/16 1602    Visit Number 5   Number of Visits 24   Date for OT Re-Evaluation 08/10/16   Authorization Type UHC    Authorization Time Period 20 visit limit, 0 used   Authorization - Visit Number 5   Authorization - Number of Visits 20   OT Start Time [id number]   OT Stop Time 1557   OT Time Calculation (min) 41 min   Activity Tolerance Patient tolerated treatment well   Behavior During Therapy [facility] for tasks assessed/performed      Past Medical History:  Diagnosis Date  . Depression   . Hypertension     Past Surgical History:  Procedure Laterality Date  . BACK SURGERY    . broken nose    . CATARACT EXTRACTION W/PHACO Right 11/01/2012   Procedure: CATARACT EXTRACTION PHACO AND INTRAOCULAR LENS PLACEMENT (IOC);  Surgeon: Tonny Branch, MD;  Location: [facility];  Service: Ophthalmology;  Laterality: Right;  CDE=7.50  . CATARACT EXTRACTION W/PHACO Left 11/15/2012   Procedure: CATARACT EXTRACTION PHACO AND INTRAOCULAR LENS PLACEMENT (IOC);  Surgeon: Tonny Branch, MD;  Location: [facility];  Service: Ophthalmology;  Laterality: Left;  CDE 6.68  . GANGLION CYST EXCISION     left wrist  . left bicep repair    . NASAL SEPTUM SURGERY    . ROTATOR CUFF REPAIR     right  . TONSILLECTOMY      There were no vitals filed for this visit.      Subjective Assessment - 07/16/16 1516    Subjective  S: I went to see the doctor on Monday and he took the sling away since I'm doing so good.    Currently in Pain? No/denies            [facility] OT Assessment - 07/16/16 1516      Assessment   Diagnosis S/P Right RCR     Precautions    Precautions Shoulder   Type of Shoulder Precautions Dr. Stann Mainland protocol:  (10/11-11/8) active scapular retraction, isometrics, elbow/wrist/hand A/ROM. Weeks 5-8 (10/9-12/6) pendulums, P/ROM. Weeks 9-12 (12/7-1/3) AA/ROM progressing to A/ROM. Week 13 (1/4) add resistance and progress to strengthening as tolerated.    Required Braces Jacob Orthoses Sling                  OT Treatments/Exercises (OP) - 07/16/16 1518      Exercises   Exercises Shoulder;Elbow     Shoulder Exercises: Supine   Protraction PROM;10 reps   Horizontal ABduction PROM;10 reps   External Rotation PROM;10 reps   Internal Rotation PROM;10 reps   Flexion PROM;10 reps   ABduction PROM;10 reps     Shoulder Exercises: Seated   Elevation AROM;15 reps   Extension AROM;15 reps   Row AROM;15 reps     Shoulder Exercises: Therapy Ball   Flexion 10 reps   ABduction 10 reps     Shoulder Exercises: Isometric Strengthening   Flexion 5X10"   Extension 5X10"   External Rotation 5X10"   Internal Rotation 5X10"   ABduction 5X10"   ADduction 5X10"     Manual Therapy   Manual Therapy Myofascial release  Manual therapy comments manual therapy completed seperately from all other interventions this date    Myofascial Release myofascial release and manual stretching to right upper arm, scapular and shoulder region to decrease pain and improve pain free mobility.                   OT Short Term Goals - 07/09/16 1659      OT SHORT TERM GOAL #1   Title Patient will be educated on a HEP for right shoulder mobility.   Time 6   Period Weeks   Status On-going     OT SHORT TERM GOAL #2   Title Patient will have WFL P/ROM in his right shoulder for increased ability to don and doff clothing.    Time 6   Period Weeks   Status Achieved     OT SHORT TERM GOAL #3   Title Patient will have 3+/5 strength in his right shoulder for increased ability to reach above waist height.    Time 6   Period Weeks    Status On-going     OT SHORT TERM GOAL #4   Title Patient will have decreased pain in his right shoulder to 2/10 Jacob better with activity.    Time 6   Period Weeks   Status Achieved     OT SHORT TERM GOAL #5   Title Patient will decrease fascial restrictions to moderate in his right shoulder region for greater mobility needed for ADL completion.    Time 6   Period Weeks   Status Achieved           OT Long Term Goals - 06/17/16 1605      OT LONG TERM GOAL #1   Title Patient will use his right arm as dominant with all B/IADLs, work, and leisure activities.     Time 12   Period Weeks   Status On-going     OT LONG TERM GOAL #2   Title Patient will decrease pain in his right shoulder to 1/10 Jacob better when reaching overhead.   Time 12   Period Weeks   Status On-going     OT LONG TERM GOAL #3   Title Patient will decrease fascial restrictions in his right shoulder to minimal for improved mobility needed to use right arm as dominant.    Time 12   Period Weeks   Status On-going     OT LONG TERM GOAL #4   Title Patient will improve right shoulder A/ROM to WNL for increased ability to reach into overhead cabinets.    Time 12   Period Weeks   Status On-going     OT LONG TERM GOAL #5   Title Patient will improve right shoulder strength to 5/5 for improved ability to lift cakes out of oven and use garden equipment.    Time 12   Period Weeks   Status On-going               Plan - 07/16/16 1603    Clinical Impression Statement A: Continued with P/ROM, isometrics, scapular A/ROM, and therapy ball exercises this session. Pt reports he has not completed his HEP yet but plans to try them. Verbal cuing for form during exercises.    Plan P: Continue to follow protocol-P/ROM working to improve ROM to [place] 06/11/16 towel slides; 10/24 isometrics, instructed pt to discontinue table slides. 11/15: table slides, elbow A/ROM, pendulums, seated scapular A/ROM  Consulted and Agree with Plan of Care Patient      Patient will benefit from skilled therapeutic intervention in order to improve the following deficits and impairments:  Decreased range of motion, Decreased skin integrity, Decreased scar mobility, Decreased strength, Increased muscle spasms, Increased fascial restricitons, Impaired UE functional use, Pain  Visit Diagnosis: Stiffness of right shoulder, not elsewhere classified  Other symptoms and signs involving the musculoskeletal system  Acute pain of right shoulder    Problem List There are no active problems to display for this patient.  Guadelupe Sabin, OTR/L  [phone] 07/16/2016, 4:05 PM  [place] [address] [place], [place], [address] Phone: [phone]   Fax:  [phone]  Name: Jacob Arellano MRN: [id number] Date of Birth: 1956-06-07

## 2016-07-23 ENCOUNTER — Ambulatory Visit (HOSPITAL_COMMUNITY): Payer: 59 | Admitting: Occupational Therapy

## 2016-07-23 ENCOUNTER — Telehealth (HOSPITAL_COMMUNITY): Payer: Self-pay | Admitting: Internal Medicine

## 2016-07-23 NOTE — Telephone Encounter (Signed)
07/23/16 pt called to cancel appointment because he wasn't feeling well and was losing his voice

## 2016-07-30 ENCOUNTER — Encounter (HOSPITAL_COMMUNITY): Payer: Self-pay | Admitting: Occupational Therapy

## 2016-07-30 ENCOUNTER — Ambulatory Visit (HOSPITAL_COMMUNITY): Payer: 59 | Attending: Orthopedic Surgery | Admitting: Occupational Therapy

## 2016-07-30 DIAGNOSIS — R29898 Other symptoms and signs involving the musculoskeletal system: Secondary | ICD-10-CM

## 2016-07-30 DIAGNOSIS — M25611 Stiffness of right shoulder, not elsewhere classified: Secondary | ICD-10-CM | POA: Insufficient documentation

## 2016-07-30 DIAGNOSIS — M25511 Pain in right shoulder: Secondary | ICD-10-CM | POA: Diagnosis present

## 2016-07-30 NOTE — Therapy (Signed)
[place], [place], [address] Phone: [phone]   Fax:  [phone]  Occupational Therapy Treatment  Patient Details  Name: Jacob Arellano MRN: [id number] Date of Birth: June 14, 1956 Referring Provider: Dr. Victorino December ([place])  Encounter Date: 07/30/2016      OT End of Session - 07/30/16 1559    Visit Number 6   Number of Visits 24   Date for OT Re-Evaluation 08/10/16   Authorization Type UHC    Authorization Time Period 20 visit limit, 0 used   Authorization - Visit Number 6   Authorization - Number of Visits 20   OT Start Time 4827   OT Stop Time 1558   OT Time Calculation (min) 42 min   Activity Tolerance Patient tolerated treatment well   Behavior During Therapy [facility] for tasks assessed/performed      Past Medical History:  Diagnosis Date  . Depression   . Hypertension     Past Surgical History:  Procedure Laterality Date  . BACK SURGERY    . broken nose    . CATARACT EXTRACTION W/PHACO Right 11/01/2012   Procedure: CATARACT EXTRACTION PHACO AND INTRAOCULAR LENS PLACEMENT (IOC);  Surgeon: Tonny Branch, MD;  Location: [facility];  Service: Ophthalmology;  Laterality: Right;  CDE=7.50  . CATARACT EXTRACTION W/PHACO Left 11/15/2012   Procedure: CATARACT EXTRACTION PHACO AND INTRAOCULAR LENS PLACEMENT (IOC);  Surgeon: Tonny Branch, MD;  Location: [facility];  Service: Ophthalmology;  Laterality: Left;  CDE 6.68  . GANGLION CYST EXCISION     left wrist  . left bicep repair    . NASAL SEPTUM SURGERY    . ROTATOR CUFF REPAIR     right  . TONSILLECTOMY      There were no vitals filed for this visit.      Subjective Assessment - 07/30/16 1517    Subjective  S: I just have a little soreness from using the leaf blower.    Currently in Pain? Yes   Pain Score 1    Pain Location Shoulder   Pain Orientation Right   Pain Descriptors / Indicators Sore   Pain Type Acute pain   Pain Radiating  Towards none   Pain Onset Yesterday   Pain Frequency Intermittent   Aggravating Factors  blowing leaves   Pain Relieving Factors rest   Effect of Pain on Daily Activities n/a   Multiple Pain Sites No            [facility] OT Assessment - 07/30/16 1517      Assessment   Diagnosis S/P Right RCR     Precautions   Precautions Shoulder   Type of Shoulder Precautions Dr. Stann Mainland protocol:  (10/11-11/8) active scapular retraction, isometrics, elbow/wrist/hand A/ROM. Weeks 5-8 (10/9-12/6) pendulums, P/ROM. Weeks 9-12 (12/7-1/3) AA/ROM progressing to A/ROM. Week 13 (1/4) add resistance and progress to strengthening as tolerated.    Required Braces or Orthoses Sling                  OT Treatments/Exercises (OP) - 07/30/16 1519      Exercises   Exercises Shoulder;Elbow     Shoulder Exercises: Supine   Protraction PROM;10 reps   Horizontal ABduction PROM;10 reps   External Rotation PROM;10 reps   Internal Rotation PROM;10 reps   Flexion PROM;10 reps   ABduction PROM;10 reps     Shoulder Exercises: Standing   Extension AROM;10 reps   Row AROM;10 reps  Shoulder Exercises: Therapy Ball   Flexion 20 reps   ABduction 20 reps     Shoulder Exercises: ROM/Strengthening   Thumb Tacks low thumb tacks 1'   Prot/Ret//Elev/Dep 1'     Shoulder Exercises: Isometric Strengthening   Flexion 5X10"   Extension 5X10"   External Rotation 5X10"   Internal Rotation 5X10"   ABduction 5X10"   ADduction 5X10"     Manual Therapy   Manual Therapy Myofascial release   Manual therapy comments manual therapy completed seperately from all other interventions this date    Myofascial Release myofascial release and manual stretching to right upper arm, scapular and shoulder region to decrease pain and improve pain free mobility.                   OT Short Term Goals - 07/09/16 1659      OT SHORT TERM GOAL #1   Title Patient will be educated on a HEP for right shoulder mobility.    Time 6   Period Weeks   Status On-going     OT SHORT TERM GOAL #2   Title Patient will have WFL P/ROM in his right shoulder for increased ability to don and doff clothing.    Time 6   Period Weeks   Status Achieved     OT SHORT TERM GOAL #3   Title Patient will have 3+/5 strength in his right shoulder for increased ability to reach above waist height.    Time 6   Period Weeks   Status On-going     OT SHORT TERM GOAL #4   Title Patient will have decreased pain in his right shoulder to 2/10 or better with activity.    Time 6   Period Weeks   Status Achieved     OT SHORT TERM GOAL #5   Title Patient will decrease fascial restrictions to moderate in his right shoulder region for greater mobility needed for ADL completion.    Time 6   Period Weeks   Status Achieved           OT Long Term Goals - 06/17/16 1605      OT LONG TERM GOAL #1   Title Patient will use his right arm as dominant with all B/IADLs, work, and leisure activities.     Time 12   Period Weeks   Status On-going     OT LONG TERM GOAL #2   Title Patient will decrease pain in his right shoulder to 1/10 or better when reaching overhead.   Time 12   Period Weeks   Status On-going     OT LONG TERM GOAL #3   Title Patient will decrease fascial restrictions in his right shoulder to minimal for improved mobility needed to use right arm as dominant.    Time 12   Period Weeks   Status On-going     OT LONG TERM GOAL #4   Title Patient will improve right shoulder A/ROM to WNL for increased ability to reach into overhead cabinets.    Time 12   Period Weeks   Status On-going     OT LONG TERM GOAL #5   Title Patient will improve right shoulder strength to 5/5 for improved ability to lift cakes out of oven and use garden equipment.    Time 12   Period Weeks   Status On-going               Plan - 07/30/16 1548  Clinical Impression Statement A: Continued with phase 1 protocol, increased therapy  ball repetitions to 20, add low thumb tacks and prot/ret/elev/dep. Pt has not complete HEP, is able to tolerate P/ROM [facility]. Verbal cuing for form during exercises. Pt reports he used the leaf blower over the weekend, keeping elbow by his side, minimal soreness in bicep.    Plan P: progress to phase II, adding AA/ROM   OT Home Exercise Plan 06/11/16 towel slides; 10/24 isometrics, instructed pt to discontinue table slides. 11/15: table slides, elbow A/ROM, pendulums, seated scapular A/ROM   Consulted and Agree with Plan of Care Patient      Patient will benefit from skilled therapeutic intervention in order to improve the following deficits and impairments:  Decreased range of motion, Decreased skin integrity, Decreased scar mobility, Decreased strength, Increased muscle spasms, Increased fascial restricitons, Impaired UE functional use, Pain  Visit Diagnosis: Stiffness of right shoulder, not elsewhere classified  Other symptoms and signs involving the musculoskeletal system  Acute pain of right shoulder    Problem List There are no active problems to display for this patient.  Guadelupe Sabin, OTR/L  [phone] 07/30/2016, 4:00 PM  [place] [address] [place], [place], [address] Phone: [phone]   Fax:  [phone]  Name: MILOH ALCOCER MRN: [id number] Date of Birth: 05/09/1956

## 2016-08-01 ENCOUNTER — Ambulatory Visit (HOSPITAL_COMMUNITY): Payer: 59 | Admitting: Occupational Therapy

## 2016-08-01 ENCOUNTER — Encounter (HOSPITAL_COMMUNITY): Payer: Self-pay | Admitting: Occupational Therapy

## 2016-08-01 DIAGNOSIS — R29898 Other symptoms and signs involving the musculoskeletal system: Secondary | ICD-10-CM

## 2016-08-01 DIAGNOSIS — M25511 Pain in right shoulder: Secondary | ICD-10-CM

## 2016-08-01 DIAGNOSIS — M25611 Stiffness of right shoulder, not elsewhere classified: Secondary | ICD-10-CM | POA: Diagnosis not present

## 2016-08-01 NOTE — Therapy (Addendum)
[place], [place], [address] Phone: [phone]   Fax:  [phone]  Occupational Therapy Treatment  Patient Details  Name: Jacob Arellano MRN: [id number] Date of Birth: 16-Jan-1956 Referring Provider: Dr. Victorino December ([place])  Encounter Date: 08/01/2016      OT End of Session - 08/01/16 1606    Visit Number 7   Number of Visits 24   Date for OT Re-Evaluation 08/10/16   Authorization Type UHC    Authorization Time Period 20 visit limit, 0 used   Authorization - Visit Number 7   Authorization - Number of Visits 20   OT Start Time 8250   OT Stop Time 1600   OT Time Calculation (min) 37 min   Activity Tolerance Patient tolerated treatment well   Behavior During Therapy [facility] for tasks assessed/performed      Past Medical History:  Diagnosis Date  . Depression   . Hypertension     Past Surgical History:  Procedure Laterality Date  . BACK SURGERY    . broken nose    . CATARACT EXTRACTION W/PHACO Right 11/01/2012   Procedure: CATARACT EXTRACTION PHACO AND INTRAOCULAR LENS PLACEMENT (IOC);  Surgeon: Tonny Branch, MD;  Location: [facility];  Service: Ophthalmology;  Laterality: Right;  CDE=7.50  . CATARACT EXTRACTION W/PHACO Left 11/15/2012   Procedure: CATARACT EXTRACTION PHACO AND INTRAOCULAR LENS PLACEMENT (IOC);  Surgeon: Tonny Branch, MD;  Location: [facility];  Service: Ophthalmology;  Laterality: Left;  CDE 6.68  . GANGLION CYST EXCISION     left wrist  . left bicep repair    . NASAL SEPTUM SURGERY    . ROTATOR CUFF REPAIR     right  . TONSILLECTOMY      There were no vitals filed for this visit.      Subjective Assessment - 08/01/16 1525    Subjective  S: It only hurts when I move.    Currently in Pain? No/denies            [facility] OT Assessment - 08/01/16 1526      Assessment   Diagnosis S/P Right RCR     Precautions   Precautions Shoulder   Type of Shoulder Precautions Dr.  Stann Mainland protocol:  (10/11-11/8) active scapular retraction, isometrics, elbow/wrist/hand A/ROM. Weeks 5-8 (10/9-12/6) pendulums, P/ROM. Weeks 9-12 (12/7-1/3) AA/ROM progressing to A/ROM. Week 13 (1/4) add resistance and progress to strengthening as tolerated.    Required Braces or Orthoses Sling                  OT Treatments/Exercises (OP) - 08/01/16 1526      Exercises   Exercises Shoulder;Elbow     Shoulder Exercises: Supine   Protraction PROM;AAROM;10 reps   Horizontal ABduction PROM;AAROM;10 reps   External Rotation PROM;AAROM;10 reps   Internal Rotation PROM;AAROM;10 reps   Flexion PROM;AAROM;10 reps   ABduction PROM;AAROM;10 reps     Shoulder Exercises: Standing   Protraction AAROM;10 reps   Horizontal ABduction AAROM;5 reps  low level   External Rotation AAROM;10 reps   Internal Rotation AAROM;10 reps   Flexion AAROM;10 reps   ABduction AAROM;10 reps   Extension AROM;10 reps   Row AROM;10 reps     Shoulder Exercises: Therapy Ball   Flexion 20 reps   ABduction 20 reps     Shoulder Exercises: ROM/Strengthening   Thumb Tacks low thumb tacks 1'     Manual Therapy   Manual Therapy Myofascial release  Manual therapy comments manual therapy completed seperately from all other interventions this date    Myofascial Release myofascial release and manual stretching to right upper arm, scapular and shoulder region to decrease pain and improve pain free mobility.                   OT Short Term Goals - 07/09/16 1659      OT SHORT TERM GOAL #1   Title Patient will be educated on a HEP for right shoulder mobility.   Time 6   Period Weeks   Status On-going     OT SHORT TERM GOAL #2   Title Patient will have WFL P/ROM in his right shoulder for increased ability to don and doff clothing.    Time 6   Period Weeks   Status Achieved     OT SHORT TERM GOAL #3   Title Patient will have 3+/5 strength in his right shoulder for increased ability to reach  above waist height.    Time 6   Period Weeks   Status On-going     OT SHORT TERM GOAL #4   Title Patient will have decreased pain in his right shoulder to 2/10 or better with activity.    Time 6   Period Weeks   Status Achieved     OT SHORT TERM GOAL #5   Title Patient will decrease fascial restrictions to moderate in his right shoulder region for greater mobility needed for ADL completion.    Time 6   Period Weeks   Status Achieved           OT Long Term Goals - 06/17/16 1605      OT LONG TERM GOAL #1   Title Patient will use his right arm as dominant with all B/IADLs, work, and leisure activities.     Time 12   Period Weeks   Status On-going     OT LONG TERM GOAL #2   Title Patient will decrease pain in his right shoulder to 1/10 or better when reaching overhead.   Time 12   Period Weeks   Status On-going     OT LONG TERM GOAL #3   Title Patient will decrease fascial restrictions in his right shoulder to minimal for improved mobility needed to use right arm as dominant.    Time 12   Period Weeks   Status On-going     OT LONG TERM GOAL #4   Title Patient will improve right shoulder A/ROM to WNL for increased ability to reach into overhead cabinets.    Time 12   Period Weeks   Status On-going     OT LONG TERM GOAL #5   Title Patient will improve right shoulder strength to 5/5 for improved ability to lift cakes out of oven and use garden equipment.    Time 12   Period Weeks   Status On-going               Plan - 08/01/16 1558    Clinical Impression Statement A: Progressed to phase II this session, adding AA/ROM in supine and standing. Pt with difficulty completing abduction due to weakness and discomfort. Pt required verbal cuing for form and technqiue, cuing to focus on depressing trapezius.    Plan P: Add wall wash, pulleys if able to tolerate.    OT Home Exercise Plan 06/11/16 towel slides; 10/24 isometrics, instructed pt to discontinue table  slides. 11/15: table slides, elbow A/ROM, pendulums, seated  scapular A/ROM   Consulted and Agree with Plan of Care Patient      Patient will benefit from skilled therapeutic intervention in order to improve the following deficits and impairments:  Decreased range of motion, Decreased skin integrity, Decreased scar mobility, Decreased strength, Increased muscle spasms, Increased fascial restricitons, Impaired UE functional use, Pain  Visit Diagnosis: Stiffness of right shoulder, not elsewhere classified  Other symptoms and signs involving the musculoskeletal system  Acute pain of right shoulder    Problem List There are no active problems to display for this patient.  Guadelupe Sabin, OTR/L  [phone] 08/01/2016, 4:07 PM  [place] [address] [place], [place], [address] Phone: [phone]   Fax:  [phone]  Name: Jacob Arellano MRN: [id number] Date of Birth: [birth year]      Addend Date: 09/03/2016 OCCUPATIONAL THERAPY DISCHARGE SUMMARY  Visits from Start of Care: 7  Current functional level related to goals / functional outcomes: Unknown: pt is being discharged due to not returning since last visit on 08/01/16. Staff has attempted to call pt 3 times to schedule additional appointments with no return call.   Remaining deficits: Unknown.   Plan: Patient agrees to discharge.  Patient goals were not met. Patient is being discharged due to not returning since the last visit.  ?????

## 2016-08-06 ENCOUNTER — Ambulatory Visit (HOSPITAL_COMMUNITY): Payer: 59 | Admitting: Occupational Therapy

## 2016-08-08 ENCOUNTER — Telehealth (HOSPITAL_COMMUNITY): Payer: Self-pay | Admitting: Occupational Therapy

## 2016-08-08 ENCOUNTER — Ambulatory Visit (HOSPITAL_COMMUNITY): Payer: 59 | Admitting: Occupational Therapy

## 2016-08-08 NOTE — Telephone Encounter (Signed)
Called pt regarding no-show. L/m asking pt to call and schedule more appts.    Guadelupe Sabin, OTR/L  [phone] 08/08/2016

## 2016-08-21 ENCOUNTER — Telehealth (HOSPITAL_COMMUNITY): Payer: Self-pay | Admitting: Internal Medicine

## 2016-08-21 NOTE — Telephone Encounter (Signed)
08/21/16 Magda Paganini asked me to call patient to see if he wanted to come back for therapy.  He said he would call us to schedule more the last time he was here but he hasn't.  I called and a guy answered the phone and I asked him if he would let Latroy know that we called and to please let us know if he would like to scehdule more appts.

## 2016-09-03 ENCOUNTER — Telehealth (HOSPITAL_COMMUNITY): Payer: Self-pay | Admitting: Occupational Therapy

## 2016-09-03 NOTE — Telephone Encounter (Signed)
Called pt and left message informing of discharge from OT services due to not returning since previous visit on 08/01/16 and no return phone calls about scheduling additional visits.     Guadelupe Sabin, OTR/L  [phone] 09/03/2016

## 2016-10-21 ENCOUNTER — Telehealth: Payer: Self-pay

## 2016-10-21 NOTE — Telephone Encounter (Signed)
Received triage letter from [facility]. Please call [phone]

## 2016-10-23 ENCOUNTER — Telehealth: Payer: Self-pay

## 2016-10-23 ENCOUNTER — Other Ambulatory Visit: Payer: Self-pay

## 2016-10-23 DIAGNOSIS — Z1211 Encounter for screening for malignant neoplasm of colon: Secondary | ICD-10-CM

## 2016-10-23 NOTE — Telephone Encounter (Signed)
SEE SEPARATE TRIAGE.

## 2016-10-23 NOTE — Telephone Encounter (Signed)
Gastroenterology Pre-Procedure Review  Request Date: 10/23/2016 Requesting Physician: Dr. Wende Neighbors  PATIENT REVIEW QUESTIONS: The patient responded to the following health history questions as indicated:    PT SAID HIS LAST COLONOSCOPY WAS 10 YEARS AGO BY DR. Arnoldo Morale  1. Diabetes Melitis: no 2. Joint replacements in the past 12 months: no 3. Major health problems in the past 3 months: no 4. Has an artificial valve or MVP: no 5. Has a defibrillator: no 6. Has been advised in past to take antibiotics in advance of a procedure like teeth cleaning: no 7. Family history of colon cancer: no  8. Alcohol Use: ONCE A WEEK   3-4 BEERS OR GLASSES OF WINE 9. History of sleep apnea: no  10. History of coronary artery or other vascular stents placed within the last 12 months: no    MEDICATIONS & ALLERGIES:    Patient reports the following regarding taking any blood thinners:   Plavix? no Aspirin? yes Coumadin? no Brilinta? no Xarelto? no Eliquis? no Pradaxa? no Savaysa? no Effient? no  Patient confirms/reports the following medications:  Current Outpatient Prescriptions  Medication Sig Dispense Refill  . aspirin EC 81 MG tablet Take 81 mg by mouth daily.    . citalopram (CELEXA) 20 MG tablet Take 20 mg by mouth daily.    . fenofibrate (TRICOR) 48 MG tablet Take 48 mg by mouth daily.    Marland Kitchen lamoTRIgine (LAMICTAL) 100 MG tablet Take 100 mg by mouth daily.    Marland Kitchen lisdexamfetamine (VYVANSE) 40 MG capsule Take 40 mg by mouth daily.    Marland Kitchen lisinopril-hydrochlorothiazide (PRINZIDE,ZESTORETIC) 10-12.5 MG per tablet Take 1 tablet by mouth daily.    . simvastatin (ZOCOR) 40 MG tablet Take 40 mg by mouth every evening.     No current facility-administered medications for this visit.     Patient confirms/reports the following allergies:  No Known Allergies  No orders of the defined types were placed in this encounter.   AUTHORIZATION INFORMATION Primary Insurance:   ID #:   Group #:  Pre-Cert /  Auth required:  Pre-Cert / Auth #:   Secondary Insurance:   ID #:   Group #:  Pre-Cert / Auth required:  Pre-Cert / Auth #:   SCHEDULE INFORMATION: Procedure has been scheduled as follows:  Date: 10/31/2016                 Time: 11:45 am   Location:   This Gastroenterology Pre-Precedure Review Form is being routed to the following provider(s): Barney Drain, MD

## 2016-10-27 MED ORDER — NA SULFATE-K SULFATE-MG SULF 17.5-3.13-1.6 GM/177ML PO SOLN: 1.0000 | ORAL | 0 refills | Status: DC

## 2016-10-27 NOTE — Telephone Encounter (Signed)

## 2016-10-28 NOTE — Telephone Encounter (Signed)
Pt is aware to pick up instructions at the pharmacy.

## 2016-10-28 NOTE — Telephone Encounter (Signed)
PA for TCS is [id number]

## 2016-10-28 NOTE — Telephone Encounter (Signed)
Rx sent to the pharmacy and instructions faxed to pharmacy. Tried to call pt to let him know his instructions were sent to the pharmacy. Mailbox full and could not leave a message.

## 2016-10-29 NOTE — Telephone Encounter (Signed)
New PA # [id number]

## 2016-10-29 NOTE — Telephone Encounter (Signed)
Ginger, please call [facility] in reference to the PA. They said insu co has pt as having the procedure on 11/10/2016 instead of 10/31/2016. Please return the call to [phone].

## 2016-10-31 ENCOUNTER — Encounter (HOSPITAL_COMMUNITY)
Admission: RE | Disposition: A | Discharge status: Home / Self Care | Payer: Self-pay | Source: Ambulatory Visit | Attending: Gastroenterology

## 2016-10-31 ENCOUNTER — Encounter (HOSPITAL_COMMUNITY): Payer: Self-pay | Admitting: *Deleted

## 2016-10-31 ENCOUNTER — Ambulatory Visit (HOSPITAL_COMMUNITY)
Admission: RE | Admit: 2016-10-31 | Discharge: 2016-10-31 | Disposition: A | Discharge status: Home / Self Care | Payer: 59 | Source: Ambulatory Visit | Attending: Gastroenterology | Admitting: Gastroenterology

## 2016-10-31 DIAGNOSIS — E7800 Pure hypercholesterolemia, unspecified: Secondary | ICD-10-CM | POA: Diagnosis not present

## 2016-10-31 DIAGNOSIS — F329 Major depressive disorder, single episode, unspecified: Secondary | ICD-10-CM | POA: Insufficient documentation

## 2016-10-31 DIAGNOSIS — Z79899 Other long term (current) drug therapy: Secondary | ICD-10-CM | POA: Diagnosis not present

## 2016-10-31 DIAGNOSIS — D122 Benign neoplasm of ascending colon: Secondary | ICD-10-CM

## 2016-10-31 DIAGNOSIS — Z1211 Encounter for screening for malignant neoplasm of colon: Secondary | ICD-10-CM | POA: Diagnosis present

## 2016-10-31 DIAGNOSIS — K648 Other hemorrhoids: Secondary | ICD-10-CM | POA: Insufficient documentation

## 2016-10-31 DIAGNOSIS — D123 Benign neoplasm of transverse colon: Secondary | ICD-10-CM | POA: Diagnosis not present

## 2016-10-31 DIAGNOSIS — I10 Essential (primary) hypertension: Secondary | ICD-10-CM | POA: Diagnosis not present

## 2016-10-31 DIAGNOSIS — Z7982 Long term (current) use of aspirin: Secondary | ICD-10-CM | POA: Insufficient documentation

## 2016-10-31 DIAGNOSIS — K5730 Diverticulosis of large intestine without perforation or abscess without bleeding: Secondary | ICD-10-CM | POA: Diagnosis not present

## 2016-10-31 DIAGNOSIS — Z1212 Encounter for screening for malignant neoplasm of rectum: Secondary | ICD-10-CM

## 2016-10-31 DIAGNOSIS — D125 Benign neoplasm of sigmoid colon: Secondary | ICD-10-CM

## 2016-10-31 DIAGNOSIS — G4730 Sleep apnea, unspecified: Secondary | ICD-10-CM | POA: Insufficient documentation

## 2016-10-31 HISTORY — DX: Pure hypercholesterolemia, unspecified: E78.00

## 2016-10-31 HISTORY — PX: COLONOSCOPY: SHX5424

## 2016-10-31 HISTORY — DX: Sleep apnea, unspecified: G47.30

## 2016-10-31 SURGERY — COLONOSCOPY
Anesthesia: Moderate Sedation

## 2016-10-31 MED ORDER — SODIUM CHLORIDE 0.9 % IV SOLN
INTRAVENOUS | Status: DC
  Administered 2016-10-31: 11:00:00 via INTRAVENOUS

## 2016-10-31 MED ORDER — MEPERIDINE HCL 100 MG/ML IJ SOLN
INTRAMUSCULAR | Status: AC
  Filled 2016-10-31: qty 2

## 2016-10-31 MED ORDER — MIDAZOLAM HCL 5 MG/5ML IJ SOLN
INTRAMUSCULAR | Status: AC
  Filled 2016-10-31: qty 10

## 2016-10-31 MED ORDER — MEPERIDINE HCL 100 MG/ML IJ SOLN
INTRAMUSCULAR | Status: DC | PRN
  Administered 2016-10-31 (×2): 25 mg via INTRAVENOUS
  Administered 2016-10-31: 50 mg via INTRAVENOUS

## 2016-10-31 MED ORDER — MIDAZOLAM HCL 5 MG/5ML IJ SOLN
INTRAMUSCULAR | Status: DC | PRN
  Administered 2016-10-31: 2 mg via INTRAVENOUS
  Administered 2016-10-31: 1 mg via INTRAVENOUS
  Administered 2016-10-31 (×2): 2 mg via INTRAVENOUS

## 2016-10-31 MED ORDER — STERILE WATER FOR IRRIGATION IR SOLN
Status: DC | PRN
  Administered 2016-10-31: 2.5 mL

## 2016-10-31 NOTE — H&P (Signed)
Primary Care Physician:  Wende Neighbors, MD Primary Gastroenterologist:  Dr. Oneida Alar  Pre-Procedure History & Physical: HPI:  Jacob Arellano is a 61 y.o. male here for [place].  Past Medical History:  Diagnosis Date  . Depression   . Hypercholesteremia   . Hypertension   . Sleep apnea     Past Surgical History:  Procedure Laterality Date  . BACK SURGERY    . broken nose    . CATARACT EXTRACTION W/PHACO Right 11/01/2012   Procedure: CATARACT EXTRACTION PHACO AND INTRAOCULAR LENS PLACEMENT (IOC);  Surgeon: Tonny Branch, MD;  Location: [facility];  Service: Ophthalmology;  Laterality: Right;  CDE=7.50  . CATARACT EXTRACTION W/PHACO Left 11/15/2012   Procedure: CATARACT EXTRACTION PHACO AND INTRAOCULAR LENS PLACEMENT (IOC);  Surgeon: Tonny Branch, MD;  Location: [facility];  Service: Ophthalmology;  Laterality: Left;  CDE 6.68  . GANGLION CYST EXCISION     Bilateral hands  . left bicep repair    . NASAL SEPTUM SURGERY    . ROTATOR CUFF REPAIR     right  . TONSILLECTOMY    . VITRECTOMY      Prior to Admission medications   Medication Sig Start Date End Date Taking? Authorizing Provider  aspirin EC 81 MG tablet Take 81 mg by mouth daily.   Yes Historical Provider, MD  citalopram (CELEXA) 20 MG tablet Take 20 mg by mouth daily.   Yes Historical Provider, MD  fenofibrate (TRICOR) 48 MG tablet Take 48 mg by mouth daily.   Yes Historical Provider, MD  lamoTRIgine (LAMICTAL) 100 MG tablet Take 100 mg by mouth daily.   Yes Historical Provider, MD  lisdexamfetamine (VYVANSE) 40 MG capsule Take 40 mg by mouth daily.   Yes Historical Provider, MD  lisinopril-hydrochlorothiazide (PRINZIDE,ZESTORETIC) 10-12.5 MG per tablet Take 1 tablet by mouth daily.   Yes Historical Provider, MD  Na Sulfate-K Sulfate-Mg Sulf (SUPREP BOWEL PREP KIT) 17.5-3.13-1.6 GM/180ML SOLN Take 1 kit by mouth as directed. 10/27/16  Yes Danie Binder, MD  simvastatin (ZOCOR) 40 MG tablet Take 40 mg by mouth every evening.     Historical Provider, MD    Allergies as of 10/23/2016  . (No Known Allergies)    Family History  Problem Relation Age of Onset  . Colon cancer Neg Hx     Social History   Social History  . Marital status: Divorced    Spouse name: N/A  . Number of children: N/A  . Years of education: N/A   Occupational History  . Not on file.   Social History Main Topics  . Smoking status: Never Smoker  . Smokeless tobacco: Never Used  . Alcohol use 3.0 oz/week    5 Cans of beer per week  . Drug use: No  . Sexual activity: Not on file   Other Topics Concern  . Not on file   Social History Narrative  . No narrative on file    Review of Systems: See HPI, otherwise negative ROS   Physical Exam: BP 131/86   Pulse 78   Temp 97.9 F (36.6 C) (Oral)   Resp 18   Ht 6' 1" (1.854 m)   Wt 205 lb (93 kg)   SpO2 98%   BMI 27.05 kg/m  General:   Alert,  pleasant and cooperative in NAD Head:  Normocephalic and atraumatic. Neck:  Supple; Lungs:  Clear throughout to auscultation.    Heart:  Regular rate and rhythm. Abdomen:  Soft, nontender and nondistended.  Normal bowel sounds, without guarding, and without rebound.   Neurologic:  Alert and  oriented x4;  grossly normal neurologically.  Impression/Plan:     SCREENING  Plan:  1. TCS TODAY. DISCUSSED PROCEDURE, BENEFITS, & RISKS: < 1% chance of medication reaction, bleeding, perforation, or rupture of spleen/liver.

## 2016-10-31 NOTE — Discharge Instructions (Signed)
You have small internal hemorrhoids and diverticulosis IN YOUR LEFT AND RIGHT COLON. YOU HAD THREE POLYPS REMOVED.    DRINK WATER TO KEEP YOUR URINE LIGHT YELLOW.  FOLLOW A HIGH FIBER DIET. AVOID ITEMS THAT CAUSE BLOATING. See info below.  YOUR BIOPSY RESULTS WILL BE AVAILABLE IN MY CHART AFTER MAR 13  AND MY OFFICE WILL CONTACT YOU IN 10-14 DAYS WITH YOUR RESULTS.   USE PREPARATION H FOUR TIMES  A DAY IF NEEDED TO RELIEVE RECTAL PAIN/PRESSURE/BLEEDING.  Next colonoscopy in 3 years.  Colonoscopy Care After Read the instructions outlined below and refer to this sheet in the next week. These discharge instructions provide you with general information on caring for yourself after you leave the hospital. While your treatment has been planned according to the most current medical practices available, unavoidable complications occasionally occur. If you have any problems or questions after discharge, call DR. Jaylie Neaves, [phone].  ACTIVITY  You may resume your regular activity, but move at a slower pace for the next 24 hours.   Take frequent rest periods for the next 24 hours.   Walking will help get rid of the air and reduce the bloated feeling in your belly (abdomen).   No driving for 24 hours (because of the medicine (anesthesia) used during the test).   You may shower.   Do not sign any important legal documents or operate any machinery for 24 hours (because of the anesthesia used during the test).    NUTRITION  Drink plenty of fluids.   You may resume your normal diet as instructed by your doctor.   Begin with a light meal and progress to your normal diet. Heavy or fried foods are harder to digest and may make you feel sick to your stomach (nauseated).   Avoid alcoholic beverages for 24 hours or as instructed.    MEDICATIONS  You may resume your normal medications.   WHAT YOU CAN EXPECT TODAY  Some feelings of bloating in the abdomen.   Passage of more gas than  usual.   Spotting of blood in your stool or on the toilet paper  .  IF YOU HAD POLYPS REMOVED DURING THE COLONOSCOPY:  Eat a soft diet IF YOU HAVE NAUSEA, BLOATING, ABDOMINAL PAIN, OR VOMITING.    FINDING OUT THE RESULTS OF YOUR TEST Not all test results are available during your visit. DR. Oneida Alar WILL CALL YOU WITHIN 14 DAYS OF YOUR PROCEDUE WITH YOUR RESULTS. Do not assume everything is normal if you have not heard from DR. Jahel Wavra, CALL HER OFFICE AT [phone].  SEEK IMMEDIATE MEDICAL ATTENTION AND CALL THE OFFICE: [phone] IF:  You have more than a spotting of blood in your stool.   Your belly is swollen (abdominal distention).   You are nauseated or vomiting.   You have a temperature over 101F.   You have abdominal pain or discomfort that is severe or gets worse throughout the day.  High-Fiber Diet A high-fiber diet changes your normal diet to include more whole grains, legumes, fruits, and vegetables. Changes in the diet involve replacing refined carbohydrates with unrefined foods. The calorie level of the diet is essentially unchanged. The Dietary Reference Intake (recommended amount) for adult males is 38 grams per day. For adult females, it is 25 grams per day. Pregnant and lactating women should consume 28 grams of fiber per day. Fiber is the intact part of a plant that is not broken down during digestion. Functional fiber is fiber that has been  isolated from the plant to provide a beneficial effect in the body. PURPOSE  Increase stool bulk.   Ease and regulate bowel movements.   Lower cholesterol.   REDUCE RISK OF COLON CANCER  INDICATIONS THAT YOU NEED MORE FIBER  Constipation and hemorrhoids.   Uncomplicated diverticulosis (intestine condition) and irritable bowel syndrome.   Weight management.   As a protective measure against hardening of the arteries (atherosclerosis), diabetes, and cancer.   GUIDELINES FOR INCREASING FIBER IN THE DIET  Start  adding fiber to the diet slowly. A gradual increase of about 5 more grams (2 slices of whole-wheat bread, 2 servings of most fruits or vegetables, or 1 bowl of high-fiber cereal) per day is best. Too rapid an increase in fiber may result in constipation, flatulence, and bloating.   Drink enough water and fluids to keep your urine clear or pale yellow. Water, juice, or caffeine-free drinks are recommended. Not drinking enough fluid may cause constipation.   Eat a variety of high-fiber foods rather than one type of fiber.   Try to increase your intake of fiber through using high-fiber foods rather than fiber pills or supplements that contain small amounts of fiber.   The goal is to change the types of food eaten. Do not supplement your present diet with high-fiber foods, but replace foods in your present diet.   INCLUDE A VARIETY OF FIBER SOURCES  Replace refined and processed grains with whole grains, canned fruits with fresh fruits, and incorporate other fiber sources. White rice, white breads, and most bakery goods contain little or no fiber.   Brown whole-grain rice, buckwheat oats, and many fruits and vegetables are all good sources of fiber. These include: broccoli, Brussels sprouts, cabbage, cauliflower, beets, sweet potatoes, white potatoes (skin on), carrots, tomatoes, eggplant, squash, berries, fresh fruits, and dried fruits.   Cereals appear to be the richest source of fiber. Cereal fiber is found in whole grains and bran. Bran is the fiber-rich outer coat of cereal grain, which is largely removed in refining. In whole-grain cereals, the bran remains. In breakfast cereals, the largest amount of fiber is found in those with "bran" in their names. The fiber content is sometimes indicated on the label.   You may need to include additional fruits and vegetables each day.   In baking, for 1 cup white flour, you may use the following substitutions:   1 cup whole-wheat flour minus 2  tablespoons.   1/2 cup white flour plus 1/2 cup whole-wheat flour.   Polyps, Colon  A polyp is extra tissue that grows inside your body. Colon polyps grow in the large intestine. The large intestine, also called the colon, is part of your digestive system. It is a long, hollow tube at the end of your digestive tract where your body makes and stores stool.Most polyps are not dangerous. They are benign. This means they are not cancerous. But over time, some types of polyps can turn into cancer. Polyps that are smaller than a pea are usually not harmful. But larger polyps could someday become or may already be cancerous. To be safe, doctors remove all polyps and test them.   PREVENTION There is not one sure way to prevent polyps. You might be able to lower your risk of getting them if you:  Eat more fruits and vegetables and less fatty food.   Do not smoke.   Avoid alcohol.   Exercise every day.   Lose weight if you are overweight.  Eating more calcium and folate can also lower your risk of getting polyps. Some foods that are rich in calcium are milk, cheese, and broccoli. Some foods that are rich in folate are chickpeas, kidney beans, and spinach.    Diverticulosis Diverticulosis is a common condition that develops when small pouches (diverticula) form in the wall of the colon. The risk of diverticulosis increases with age. It happens more often in people who eat a low-fiber diet. Most individuals with diverticulosis have no symptoms. Those individuals with symptoms usually experience belly (abdominal) pain, constipation, or loose stools (diarrhea).  HOME CARE INSTRUCTIONS  Increase the amount of fiber in your diet as directed by your caregiver or dietician. This may reduce symptoms of diverticulosis.   Drink at least 6 to 8 glasses of water each day to prevent constipation.   Try not to strain when you have a bowel movement.   Avoiding nuts and seeds to prevent complications is NOT  NECESSARY.   FOODS HAVING HIGH FIBER CONTENT INCLUDE:  Fruits. Apple, peach, pear, tangerine, raisins, prunes.   Vegetables. Brussels sprouts, asparagus, broccoli, cabbage, carrot, cauliflower, romaine lettuce, spinach, summer squash, tomato, winter squash, zucchini.   Starchy Vegetables. Baked beans, kidney beans, lima beans, split peas, lentils, potatoes (with skin).   Grains. Whole wheat bread, brown rice, bran flake cereal, plain oatmeal, white rice, shredded wheat, bran muffins.   SEEK IMMEDIATE MEDICAL CARE IF:  You develop increasing pain or severe bloating.   You have an oral temperature above 101F.   You develop vomiting or bowel movements that are bloody or black.   Hemorrhoids Hemorrhoids are dilated (enlarged) veins around the rectum. Sometimes clots will form in the veins. This makes them swollen and painful. These are called thrombosed hemorrhoids. Causes of hemorrhoids include:  Constipation.   Straining to have a bowel movement.   HEAVY LIFTING   HOME CARE INSTRUCTIONS  Eat a well balanced diet and drink 6 to 8 glasses of water every day to avoid constipation. You may also use a bulk laxative.   Avoid straining to have bowel movements.   Keep anal area dry and clean.   Do not use a donut shaped pillow or sit on the toilet for long periods. This increases blood pooling and pain.   Move your bowels when your body has the urge; this will require less straining and will decrease pain and pressure.

## 2016-10-31 NOTE — Op Note (Signed)
[facility] Patient Name: Jacob Arellano Procedure Date: 10/31/2016 11:31 AM MRN: [id number] Date of Birth: [birth year] Attending MD: Barney Drain , MD CSN: [id number] Age: 61 Admit Type: Outpatient Procedure:                Colonoscopy WITH COLD FORCEPS/SNARE AND SNARE                            CAUTERY POLYPECTOMY Indications:              Screening for colorectal malignant neoplasm Providers:                Barney Drain, MD, Lurline Del, RN, Aram Candela Referring MD:             Delphina Cahill, MD Medicines:                Meperidine 100 mg IV, Midazolam 7 mg IV Complications:            No immediate complications. Estimated Blood Loss:     Estimated blood loss was minimal. Procedure:                Pre-Anesthesia Assessment:                           - Prior to the procedure, a History and Physical                            was performed, and patient medications and                            allergies were reviewed. The patient's tolerance of                            previous anesthesia was also reviewed. The risks                            and benefits of the procedure and the sedation                            options and risks were discussed with the patient.                            All questions were answered, and informed consent                            was obtained. Prior Anticoagulants: The patient has                            taken aspirin, last dose was 1 day prior to                            procedure. ASA Grade Assessment: II - A patient                            with mild systemic disease. After reviewing the  risks and benefits, the patient was deemed in                            satisfactory condition to undergo the procedure.                            After obtaining informed consent, the colonoscope                            was passed under direct vision. Throughout the                            procedure, the  patient's blood pressure, pulse, and                            oxygen saturations were monitored continuously. The                            EC-[id number] ([id number]) scope was introduced through                            the anus and advanced to the the cecum, identified                            by appendiceal orifice and ileocecal valve. The                            colonoscopy was somewhat difficult due to a                            tortuous colon. Successful completion of the                            procedure was aided by increasing the dose of                            sedation medication and COLOWRAP. The patient                            tolerated the procedure fairly well. The quality of                            the bowel preparation was excellent. The ileocecal                            valve, appendiceal orifice, and rectum were                            photographed. Scope In: 12:06:31 PM Scope Out: 12:29:41 PM Scope Withdrawal Time: 0 hours 16 minutes 7 seconds  Total Procedure Duration: 0 hours 23 minutes 10 seconds  Findings:      Two sessile polyps were found in the sigmoid colon(HS) and splenic       flexure(CS). The polyps were 5  to 7 mm in size. These polyps were       removed with a snare. Resection and retrieval were complete.      A 3 mm polyp was found in the ascending colon. The polyp was sessile.       The polyp was removed with a cold biopsy forceps. Resection and       retrieval were complete.      Multiple small and large-mouthed diverticula were found in the entire       colon.      Internal hemorrhoids were found during retroflexion. The hemorrhoids       were small. Impression:               - Two 5 to 7 mm polyps in the sigmoid colon and at                            the splenic flexure, removed.                           - One 3 mm polyp in the ascending colon, removed                            with a cold biopsy forceps.                            - Diverticulosis in the entire examined colon.                           - Internal hemorrhoids. Moderate Sedation:      Moderate (conscious) sedation was administered by the endoscopy nurse       and supervised by the endoscopist. The following parameters were       monitored: oxygen saturation, heart rate, blood pressure, and response       to care. Total physician intraservice time was 36 minutes. Recommendation:           - Repeat colonoscopy in 3 years for surveillance.                           - High fiber diet.                           - Continue present medications.                           - Await pathology results.                           - Patient has a contact number available for                            emergencies. The signs and symptoms of potential                            delayed complications were discussed with the                            patient.  Return to normal activities tomorrow.                            Written discharge instructions were provided to the                            patient. Procedure Code(s):        --- Professional ---                           [id number], Colonoscopy, flexible; with removal of                            tumor(s), polyp(s), or other lesion(s) by snare                            technique                           45380, 59, Colonoscopy, flexible; with biopsy,                            single or multiple                           99152, Moderate sedation services provided by the                            same physician or other qualified health care                            professional performing the diagnostic or                            therapeutic service that the sedation supports,                            requiring the presence of an independent trained                            observer to assist in the monitoring of the                            patient's level of consciousness and physiological                             status; initial 15 minutes of intraservice time,                            patient age 18 years or older                           [phone], Moderate sedation services; each additional                            15 minutes intraservice time  Diagnosis Code(s):        --- Professional ---                           Z12.11, Encounter for screening for malignant                            neoplasm of colon                           D12.5, Benign neoplasm of sigmoid colon                           D12.3, Benign neoplasm of transverse colon (hepatic                            flexure or splenic flexure)                           D12.2, Benign neoplasm of ascending colon                           K64.8, Other hemorrhoids                           K57.30, Diverticulosis of large intestine without                            perforation or abscess without bleeding CPT copyright 2016 [employer]. All rights reserved. The codes documented in this report are preliminary and upon coder review may  be revised to meet current compliance requirements. Barney Drain, MD Barney Drain, MD 10/31/2016 12:40:32 PM This report has been signed electronically. Number of Addenda: 0

## 2016-11-05 ENCOUNTER — Encounter (HOSPITAL_COMMUNITY): Payer: Self-pay | Admitting: Gastroenterology

## 2016-11-24 ENCOUNTER — Telehealth: Payer: Self-pay | Admitting: Gastroenterology

## 2016-11-24 NOTE — Telephone Encounter (Signed)
Please call pt. HE had THREE simple adenomas removed.   DRINK WATER TO KEEP YOUR URINE LIGHT YELLOW.  FOLLOW A HIGH FIBER DIET. AVOID ITEMS THAT CAUSE BLOATING.   USE PREPARATION H FOUR TIMES  A DAY IF NEEDED TO RELIEVE RECTAL PAIN/PRESSURE/BLEEDING.  Next colonoscopy in 3 years.

## 2016-11-25 NOTE — Telephone Encounter (Signed)
PT is aware.

## 2016-11-25 NOTE — Telephone Encounter (Signed)
Reminder in epic °

## 2016-11-25 NOTE — Telephone Encounter (Signed)
LMOM to call.

## 2017-01-09 ENCOUNTER — Emergency Department (HOSPITAL_COMMUNITY)
Admission: EM | Admit: 2017-01-09 | Discharge: 2017-01-09 | Disposition: A | Discharge status: Home / Self Care | Payer: 59 | Attending: Emergency Medicine | Admitting: Emergency Medicine

## 2017-01-09 ENCOUNTER — Encounter (HOSPITAL_COMMUNITY): Payer: Self-pay | Admitting: Emergency Medicine

## 2017-01-09 ENCOUNTER — Emergency Department (HOSPITAL_COMMUNITY): Payer: 59

## 2017-01-09 DIAGNOSIS — R55 Syncope and collapse: Secondary | ICD-10-CM | POA: Diagnosis present

## 2017-01-09 DIAGNOSIS — M25511 Pain in right shoulder: Secondary | ICD-10-CM | POA: Diagnosis not present

## 2017-01-09 DIAGNOSIS — Z7982 Long term (current) use of aspirin: Secondary | ICD-10-CM | POA: Diagnosis not present

## 2017-01-09 DIAGNOSIS — Z79899 Other long term (current) drug therapy: Secondary | ICD-10-CM | POA: Diagnosis not present

## 2017-01-09 DIAGNOSIS — I10 Essential (primary) hypertension: Secondary | ICD-10-CM | POA: Diagnosis not present

## 2017-01-09 LAB — CBC WITH DIFFERENTIAL/PLATELET
Basophils Absolute: 0 10*3/uL (ref 0.0–0.1)
Basophils Relative: 0 %
Eosinophils Absolute: 0 10*3/uL (ref 0.0–0.7)
Eosinophils Relative: 0 %
HCT: 34.8 % — ABNORMAL LOW (ref 39.0–52.0)
Hemoglobin: 11.6 g/dL — ABNORMAL LOW (ref 13.0–17.0)
Lymphocytes Relative: 30 %
Lymphs Abs: 1.9 10*3/uL (ref 0.7–4.0)
MCH: 30.5 pg (ref 26.0–34.0)
MCHC: 33.3 g/dL (ref 30.0–36.0)
MCV: 91.6 fL (ref 78.0–100.0)
Monocytes Absolute: 0.4 10*3/uL (ref 0.1–1.0)
Monocytes Relative: 7 %
Neutro Abs: 4.1 10*3/uL (ref 1.7–7.7)
Neutrophils Relative %: 63 %
Platelets: 359 10*3/uL (ref 150–400)
RBC: 3.8 MIL/uL — ABNORMAL LOW (ref 4.22–5.81)
RDW: 13.7 % (ref 11.5–15.5)
WBC: 6.5 10*3/uL (ref 4.0–10.5)

## 2017-01-09 LAB — COMPREHENSIVE METABOLIC PANEL
ALBUMIN: 4.3 g/dL (ref 3.5–5.0)
ALT: 24 U/L (ref 17–63)
AST: 25 U/L (ref 15–41)
Alkaline Phosphatase: 63 U/L (ref 38–126)
Anion gap: 8 (ref 5–15)
BUN: 22 mg/dL — AB (ref 6–20)
CHLORIDE: 104 mmol/L (ref 101–111)
CO2: 25 mmol/L (ref 22–32)
CREATININE: 1.37 mg/dL — AB (ref 0.61–1.24)
Calcium: 9 mg/dL (ref 8.9–10.3)
GFR calc Af Amer: >60 mL/min (ref >60)
GFR calc non Af Amer: 55 mL/min — ABNORMAL LOW (ref >60)
Glucose, Bld: 109 mg/dL — ABNORMAL HIGH (ref 65–99)
Potassium: 3.9 mmol/L (ref 3.5–5.1)
SODIUM: 137 mmol/L (ref 135–145)
Total Bilirubin: 0.4 mg/dL (ref 0.3–1.2)
Total Protein: 7.1 g/dL (ref 6.5–8.1)

## 2017-01-09 LAB — CBG MONITORING, ED: Glucose-Capillary: 117 mg/dL — ABNORMAL HIGH (ref 65–99)

## 2017-01-09 MED ORDER — SODIUM CHLORIDE 0.9 % IV BOLUS (SEPSIS)
1000.0000 mL | Freq: Once | INTRAVENOUS | Status: AC
  Administered 2017-01-09: 1000 mL via INTRAVENOUS

## 2017-01-09 NOTE — ED Triage Notes (Signed)
Pt had been laying on couch, when he stood he lost sight and fell to ground.  Pt states "I never lost consciousness".  Having right shoulder pain.  Did hit head.  Recent surgery to right shoulder

## 2017-01-09 NOTE — Discharge Instructions (Signed)
Please drink plenty of fluids. You may increase salt in your diet slightly.  Please return without fail for worsening symptoms, including recurrent passing out, confusion, severe chest pain, confusion or any other symptoms concerning to you.

## 2017-01-09 NOTE — ED Provider Notes (Signed)
[facility] DEPT Provider Note  CSN: [id number] Arrival date & time: 01/09/17  2048  By signing my name below, I, Dolores Hoose, attest that this documentation has been prepared under the direction and in the presence of Timberlynn Kizziah, Eugenia Mcalpine, MD . Electronically Signed: Dolores Hoose, Scribe. 01/09/2017. 9:05 PM.  History   Chief Complaint Chief Complaint  Patient presents with  . Near Syncope   The history is provided by the patient. No language interpreter was used.    HPI Comments:  Jacob Arellano is a 61 y.o. male who presents to the Emergency Department after the sudden-onset of a near syncopal episode <2hours ago. Pt states that he was resting horizontally on the couch and when he stood up and began walking, his vision went dark, felt lightheaded and he fell to the ground. He notes that he did not lose consciousness but did hit his head on the floor during the fall. Pt states that he has had a few episodes of light-headedness recently when he stands up from seating, but nothing as sudden or severe as his episode today. He reports associated right shoulder pain and states that he recently underwent shoulder surgery (superior capsular reconstruction, shoulder arthroscopy). No medications tried PTA. Denies any CP, SOB, LE swelling, calf tenderness, vomiting, diarrhea, nausea, abdominal pain or back pain. No melena or hematochezia. States he has had syncope in the past when he was younger related to orthostasis.  P/o intake intact.   Past Medical History:  Diagnosis Date  . Depression   . Hypercholesteremia   . Hypertension   . Sleep apnea     Patient Active Problem List   Diagnosis Date Noted  . Special screening for malignant neoplasms, colon     Past Surgical History:  Procedure Laterality Date  . BACK SURGERY    . broken nose    . CATARACT EXTRACTION W/PHACO Right 11/01/2012   Procedure: CATARACT EXTRACTION PHACO AND INTRAOCULAR LENS PLACEMENT (IOC);  Surgeon: Tonny Branch, MD;   Location: [facility];  Service: Ophthalmology;  Laterality: Right;  CDE=7.50  . CATARACT EXTRACTION W/PHACO Left 11/15/2012   Procedure: CATARACT EXTRACTION PHACO AND INTRAOCULAR LENS PLACEMENT (IOC);  Surgeon: Tonny Branch, MD;  Location: [facility];  Service: Ophthalmology;  Laterality: Left;  CDE 6.68  . COLONOSCOPY N/A 10/31/2016   Procedure: COLONOSCOPY;  Surgeon: Danie Binder, MD;  Location: [facility];  Service: Endoscopy;  Laterality: N/A;  11:45 pm  . GANGLION CYST EXCISION     Bilateral hands  . left bicep repair    . NASAL SEPTUM SURGERY    . ROTATOR CUFF REPAIR     right  . TONSILLECTOMY    . VITRECTOMY         Home Medications    Prior to Admission medications   Medication Sig Start Date End Date Taking? Authorizing Provider  aspirin EC 81 MG tablet Take 81 mg by mouth every morning.    Yes [provider]  citalopram (CELEXA) 20 MG tablet Take 20 mg by mouth at bedtime.    Yes [provider]  fenofibrate (TRICOR) 48 MG tablet Take 48 mg by mouth at bedtime.    Yes [provider]  lamoTRIgine (LAMICTAL) 150 MG tablet Take 150 mg by mouth 2 (two) times daily. 08/14/16  Yes [provider]  lisdexamfetamine (VYVANSE) 40 MG capsule Take 40 mg by mouth every morning.    Yes [provider]  lisinopril-hydrochlorothiazide (PRINZIDE,ZESTORETIC) 10-12.5 MG per tablet Take 1  tablet by mouth every morning.    Yes [provider]  Multiple Vitamins-Minerals (CENTRUM MEN PO) Take 1 tablet by mouth daily.   Yes [provider]  simvastatin (ZOCOR) 40 MG tablet Take 40 mg by mouth every evening.   Yes [provider]    Family History Family History  Problem Relation Age of Onset  . Colon cancer Neg Hx     Social History Social History  Substance Use Topics  . Smoking status: Never Smoker  . Smokeless tobacco: Never Used  . Alcohol use 3.0 oz/week    5 Cans of beer per week     Allergies   Patient has  no known allergies.   Review of Systems Review of Systems  Constitutional: Negative for fever.  Respiratory: Negative for shortness of breath.   Cardiovascular: Negative for chest pain.  Gastrointestinal: Negative for abdominal pain and blood in stool.  Musculoskeletal: Negative for back pain.  Allergic/Immunologic: Negative for immunocompromised state.  Neurological: Negative for weakness and numbness.  Hematological: Does not bruise/bleed easily.  All other systems reviewed and are negative.   Physical Exam Updated Vital Signs BP 116/70   Pulse 73   Temp 98 F (36.7 C) (Oral)   Resp 13   Ht 6\' 1"  (1.854 m)   Wt 210 lb (95.3 kg)   SpO2 95%   BMI 27.71 kg/m   Physical Exam Physical Exam  Nursing note and vitals reviewed. Constitutional: Well developed, well nourished, non-toxic, and in no acute distress Head: Normocephalic and atraumatic.  Mouth/Throat: Oropharynx is clear and moist.  Neck: Normal range of motion. Neck supple.  Cardiovascular: Normal rate and regular rhythm.   Pulmonary/Chest: Effort normal and breath sounds normal.  Abdominal: Soft. There is no tenderness. There is no rebound and no guarding.  Musculoskeletal: Normal range of motion. no edema Neurological: Alert, no facial droop, fluent speech, moves all extremities symmetrically Skin: Skin is warm and dry. Post surgical wounds of right shoulder are clean dry and intact.  Psychiatric: Cooperative   ED Treatments / Results  DIAGNOSTIC STUDIES:  Oxygen Saturation is 97% on RA, normal by my interpretation.    COORDINATION OF CARE:  9:20 PM Discussed treatment plan with pt at bedside which includes orthostatics, blood work and XR imaging and pt agreed to plan.  Labs (all labs ordered are listed, but only abnormal results are displayed) Labs Reviewed  CBC WITH DIFFERENTIAL/PLATELET - Abnormal; Notable for the following:       Result Value   RBC 3.80 (*)    Hemoglobin 11.6 (*)    HCT 34.8 (*)      All other components within normal limits  COMPREHENSIVE METABOLIC PANEL - Abnormal; Notable for the following:    Glucose, Bld 109 (*)    BUN 22 (*)    Creatinine, Ser 1.37 (*)    GFR calc non Af Amer 55 (*)    All other components within normal limits  CBG MONITORING, ED - Abnormal; Notable for the following:    Glucose-Capillary 117 (*)    All other components within normal limits    EKG  EKG Interpretation  Date/Time:  Friday Jan 09 2017 21:29:51 EDT Ventricular Rate:  75 PR Interval:    QRS Duration: 97 QT Interval:  357 QTC Calculation: 399 R Axis:   81 Text Interpretation:  Sinus rhythm Borderline right axis deviation no acute changes  Confirmed by Leanora Murin MD, Jovahn Breit ([id number]) on 01/09/2017 9:48:16 PM  Radiology Dg Shoulder Right  Result Date: 01/09/2017 CLINICAL DATA:  Right shoulder pain EXAM: RIGHT SHOULDER - 2+ VIEW COMPARISON:  04/15/2016 FINDINGS: Moderate AC joint degenerative change. No dislocation or fracture of the right humeral head. Right lung apex is clear. Mild glenohumeral out degenerative change with superior and inferior bony spurring of the glenoid. IMPRESSION: 1. No definite acute displaced fracture or dislocation 2. Degenerative changes of the [facility] joint and glenohumeral interval. Electronically Signed   By: Donavan Foil M.D.   On: 01/09/2017 21:53    Procedures Procedures (including critical care time)  Medications Ordered in ED Medications  sodium chloride 0.9 % bolus 1,000 mL (0 mLs Intravenous Stopped 01/09/17 2250)     Initial Impression / Assessment and Plan / ED Course  I have reviewed the triage vital signs and the nursing notes.  Pertinent labs & imaging results that were available during my care of the patient were reviewed by me and considered in my medical decision making (see chart for details).     Presenting with near syncopal episode tonight. Recently with lightheadedness upon standing form sitting and past  history of  orthostasis. Today's presentation seem similar. Does show some orthostatic decrease in BP upon standing. Exam otherwise unremarkable. EKG without ischemia, strain or stigmata of arrhythmia. With mild anemia 11.6 hgb, but likely post-op given recent shoulder surgery. No history of recent active bleeding. No tachycardia, dyspnea, chest pain, DVT symptoms. No concerns for PE at this time. No CHF or cardiac symptoms. Patient is felt to be stable for discharge home. Strict return and follow-up instructions reviewed. He expressed understanding of all discharge instructions and felt comfortable with the plan of care.   Final Clinical Impressions(s) / ED Diagnoses   Final diagnoses:  Near syncope    New Prescriptions New Prescriptions   No medications on file   I personally performed the services described in this documentation, which was scribed in my presence. The recorded information has been reviewed and is accurate.    Forde Dandy, MD 01/09/17 [phone]

## 2017-01-09 NOTE — ED Notes (Signed)
Visual acuity: Left eye: 20/50 Right eye 20/25

## 2017-01-26 ENCOUNTER — Other Ambulatory Visit (HOSPITAL_COMMUNITY)
Admission: RE | Admit: 2017-01-26 | Discharge: 2017-01-26 | Disposition: A | Discharge status: Home / Self Care | Payer: 59 | Source: Other Acute Inpatient Hospital | Attending: Family Medicine | Admitting: Family Medicine

## 2017-01-26 DIAGNOSIS — M12811 Other specific arthropathies, not elsewhere classified, right shoulder: Secondary | ICD-10-CM | POA: Insufficient documentation

## 2017-01-26 DIAGNOSIS — B999 Unspecified infectious disease: Secondary | ICD-10-CM | POA: Diagnosis not present

## 2017-01-26 LAB — CBC WITH DIFFERENTIAL/PLATELET
BASOS ABS: 0 10*3/uL (ref 0.0–0.1)
BASOS PCT: 1 %
EOS ABS: 0.1 10*3/uL (ref 0.0–0.7)
EOS PCT: 2 %
HCT: 37.7 % — ABNORMAL LOW (ref 39.0–52.0)
Hemoglobin: 12.2 g/dL — ABNORMAL LOW (ref 13.0–17.0)
LYMPHS PCT: 29 %
Lymphs Abs: 1.7 10*3/uL (ref 0.7–4.0)
MCH: 30 pg (ref 26.0–34.0)
MCHC: 32.4 g/dL (ref 30.0–36.0)
MCV: 92.6 fL (ref 78.0–100.0)
Monocytes Absolute: 0.5 10*3/uL (ref 0.1–1.0)
Monocytes Relative: 8 %
Neutro Abs: 3.6 10*3/uL (ref 1.7–7.7)
Neutrophils Relative %: 60 %
PLATELETS: 316 10*3/uL (ref 150–400)
RBC: 4.07 MIL/uL — AB (ref 4.22–5.81)
RDW: 14 % (ref 11.5–15.5)
WBC: 5.8 10*3/uL (ref 4.0–10.5)

## 2017-01-26 LAB — CREATININE, SERUM: CREATININE: 0.96 mg/dL (ref 0.61–1.24)

## 2017-01-26 LAB — CK: Total CK: 161 U/L (ref 49–397)

## 2017-01-26 LAB — BUN: BUN: 22 mg/dL — AB (ref 6–20)

## 2017-01-28 ENCOUNTER — Ambulatory Visit (HOSPITAL_COMMUNITY): Payer: 59 | Attending: Orthopaedic Surgery

## 2017-01-28 ENCOUNTER — Encounter (HOSPITAL_COMMUNITY): Payer: Self-pay

## 2017-01-28 DIAGNOSIS — R29898 Other symptoms and signs involving the musculoskeletal system: Secondary | ICD-10-CM | POA: Insufficient documentation

## 2017-01-28 DIAGNOSIS — M25611 Stiffness of right shoulder, not elsewhere classified: Secondary | ICD-10-CM | POA: Insufficient documentation

## 2017-01-28 DIAGNOSIS — M25511 Pain in right shoulder: Secondary | ICD-10-CM | POA: Insufficient documentation

## 2017-01-28 NOTE — Therapy (Signed)
Jacob Arellano, [place], [address] Phone: [phone]   Fax:  [phone]  Occupational Therapy Evaluation  Patient Details  Name: Jacob Arellano MRN: [id number] Date of Birth: Jan 08, 1956 Referring Provider: Dr. Judye Arellano  Encounter Date: 01/28/2017      OT End of Session - 01/28/17 1232    Visit Number 1   Number of Visits 16   Date for OT Re-Evaluation 03/29/17  mini-reassessment: 02/25/17   Authorization Type [employer]    Authorization Time Period 100% covered   OT Start Time 1121   OT Stop Time 1207   OT Time Calculation (min) 46 min   Activity Tolerance Patient tolerated treatment well   Behavior During Therapy [facility] for tasks assessed/performed      Past Medical History:  Diagnosis Date  . Depression   . Hypercholesteremia   . Hypertension   . Sleep apnea     Past Surgical History:  Procedure Laterality Date  . BACK SURGERY    . broken nose    . CATARACT EXTRACTION W/PHACO Right 11/01/2012   Procedure: CATARACT EXTRACTION PHACO AND INTRAOCULAR LENS PLACEMENT (IOC);  Surgeon: Jacob Branch, MD;  Location: [facility];  Service: Ophthalmology;  Laterality: Right;  CDE=7.50  . CATARACT EXTRACTION W/PHACO Left 11/15/2012   Procedure: CATARACT EXTRACTION PHACO AND INTRAOCULAR LENS PLACEMENT (IOC);  Surgeon: Jacob Branch, MD;  Location: [facility];  Service: Ophthalmology;  Laterality: Left;  CDE 6.68  . COLONOSCOPY N/A 10/31/2016   Procedure: COLONOSCOPY;  Surgeon: Jacob Binder, MD;  Location: [facility];  Service: Endoscopy;  Laterality: N/A;  11:45 pm  . GANGLION CYST EXCISION     Bilateral hands  . left bicep repair    . NASAL SEPTUM SURGERY    . ROTATOR CUFF REPAIR     right  . TONSILLECTOMY    . VITRECTOMY      There were no vitals filed for this visit.      Subjective Assessment - 01/28/17 1220    Subjective  S: The only part that is uncomfortable is the scar on my armpit.   Pertinent History Jacob Arellano  is a 61 year old male who had right rotator cuff repair on 12/31/15.  He presents today with his right arm in a sling.  Dr. Berenice Arellano has referred pt to occupational therapy for evaluation and treatment following his standard rotator cuff protocol.     Special Tests FOTO to be completed next session   Patient Stated Goals Get my arm back to normal.   Currently in Pain? No/denies           [facility] OT Assessment - 01/28/17 1130      Assessment   Diagnosis R Shoulder RTC Tear Arthroscopr   Referring Provider Dr. Judye Arellano   Onset Date 12/30/16   Prior Therapy Prior therapy for previous R RTC surgery     Precautions   Precautions Shoulder   Type of Shoulder Precautions Dr. Judye Arellano protocol: P/ROM until 02/10/17 then begin AA/ROM until 02/24/17 then begin A/ROM until 03/24/17 and begin strengthening   Required Braces or Orthoses Sling     Restrictions   Weight Bearing Restrictions Yes     Balance Screen   Has the patient fallen in the past 6 months Yes   How many times? 1   Has the patient had a decrease in activity level because of a fear of falling?  No   Is the patient  reluctant to leave their home because of a fear of falling?  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  Unable to use right arm actively above waist height      Mobility   Mobility Status History of falls;Independent     Written Expression   Dominant Hand Right     Vision - History   Baseline Vision Wears glasses only for reading     Cognition   Overall Cognitive Status Within Functional Limits for tasks assessed     Observation/Other Assessments   Observations Measurements taken of  scars: 1.2cm posterior deltoid, 1.3cm anterior deltoid, 2.5cm proximal to [facility] joint, 1.0cm medial deltoid, 3.9cm axillary     ROM / Strength   AROM / PROM / Strength AROM;PROM;Strength     Palpation   Palpation comment min fascial restrictions in right upper arm and shoulder region      AROM   Overall AROM  Unable to assess;Due to precautions     PROM   Overall PROM Comments assessed in supine, external and internal rotation with shoulder adducted   PROM Assessment Site Shoulder   Right/Left Shoulder Right   Right Shoulder Flexion 109 Degrees   Right Shoulder ABduction 84 Degrees   Right Shoulder Internal Rotation 90 Degrees   Right Shoulder External Rotation 62 Degrees     Strength   Overall Strength Unable to assess;Due to precautions                         OT Education - 01/28/17 1231    Education provided Yes   Education Details Pt given HEP with table slides, pendulums, elbow and wrist A/ROM   Person(s) Educated Patient   Methods Explanation;Demonstration;Verbal cues;Handout   Comprehension Verbalized understanding;Returned demonstration          OT Short Term Goals - 01/28/17 1251      OT SHORT TERM GOAL #1   Title Pt will understand and be independent with the HEP provided to facilitate his progress with ROM and strength.   Time 4   Period Weeks   Status New     OT SHORT TERM GOAL #2   Title Pt will have strength of 3+/5 to assist in his right arm for ADL tasks such as lightweight household activities.   Time 4   Period Weeks   Status New     OT SHORT TERM GOAL #3   Title Patuients P/ROM of right arm will be within normal limits to increase his ability to complete overhead reaching activities.   Time 4   Period Weeks   Status New           OT Long Term Goals - 01/28/17 1301      OT LONG TERM GOAL #1   Title Pt will return to prior level of independence with all daily and work activities using his right arm as dominant.    Time 8    Period Weeks   Status New     OT LONG TERM GOAL #2   Title Pt will have decreased fascial  restrictions to zero in his RUE to relieve tightness and tenderness to facilitate better ROM.   Time 8   Period Weeks   Status New     OT LONG TERM GOAL #3   Title Pt will have 2/10 or less for pain when using RUE while completing daily activities.    Time 8   Period Weeks   Status New     OT LONG TERM GOAL #4   Title Pt will have strength of 4+/5 to assist his right arm for ADL tasks such as yard work and heavy household chores.   Time 8   Period Weeks   Status New     OT LONG TERM GOAL #5   Title Patients A/ROM of right arm will be within normal limits to increase his ability to complete all overhead reaching activities.   Time 8   Period Weeks   Status New               Plan - 01/28/17 1235    Clinical Impression Statement A: Pt is a 61 year old male S/P right shoulder rotator cuff tear arthroscopy causing increased fascial restrictions and decreased strength and ROM resulting in difficulty completing ADL task with RUE. Pt [place]17-02/14/17 and will not make appointments during this time.    Occupational Profile and client history currently impacting functional performance Motivated to return to prior level of function, he was independent prior to surgery    Occupational performance deficits (Please refer to evaluation for details): ADL's;Work;Leisure   Rehab Potential Good   Current Impairments/barriers affecting progress:  poor carryover with HEP, poor attendance, history of prior right rotator cuff repair   OT Frequency 2x / week   OT Duration 8 weeks   OT Treatment/Interventions Self-care/ADL training;Moist Heat;Splinting;Patient/family education;Ultrasound;Therapeutic exercise;Scar mobilization;Therapeutic activities;Cryotherapy;Iontophoresis;Passive range of motion;Electrical Stimulation;Manual Therapy;DME and/or AE instruction   Plan P:  Pt will benefit from skilled OT services to increase functional performance during ADL tasks while using RUE as dominant. Treatment plan: myofascial release, manual stretching, P/ROM, AA/ROM, A/ROM, shoulder and scapular strengthening, follow MD protocol   Clinical Decision Making Limited treatment options, no task modification necessary   OT Home Exercise Plan 01/28/17: towel slides, pendulums, A/ROM for elbow and wrist   Consulted and Agree with Plan of Care Patient      Patient will benefit from skilled therapeutic intervention in order to improve the following deficits and impairments:  Decreased range of motion, Increased fascial restricitons, Decreased scar mobility, Impaired UE functional use, Decreased strength, Pain  Visit Diagnosis: Other symptoms and signs involving the musculoskeletal system  Acute pain of right shoulder  Stiffness of right shoulder, not elsewhere classified    Problem List Patient Active Problem List   Diagnosis Date Noted  . Special screening for malignant neoplasms, colon     Luther Hearing, OT Student [phone] 01/28/2017, 3:31 PM  [facility] [address] [place], [place], [address] Phone: [phone]   Fax:  [phone]  Name: Jacob Arellano MRN: [id number] Date of Birth: 1955/11/14   Note reviewed by clinical instructor and accurately reflects treatment session.  Ailene Ravel, OTR/L,CBIS  [phone]

## 2017-02-02 ENCOUNTER — Other Ambulatory Visit (HOSPITAL_COMMUNITY)
Admission: AD | Admit: 2017-02-02 | Discharge: 2017-02-02 | Disposition: A | Discharge status: Home / Self Care | Payer: 59 | Source: Skilled Nursing Facility | Attending: Internal Medicine | Admitting: Internal Medicine

## 2017-02-02 DIAGNOSIS — B999 Unspecified infectious disease: Secondary | ICD-10-CM | POA: Insufficient documentation

## 2017-02-02 LAB — CBC WITH DIFFERENTIAL/PLATELET
BASOS ABS: 0 10*3/uL (ref 0.0–0.1)
BASOS PCT: 0 %
EOS ABS: 0.1 10*3/uL (ref 0.0–0.7)
EOS PCT: 2 %
HCT: 38.3 % — ABNORMAL LOW (ref 39.0–52.0)
Hemoglobin: 12.5 g/dL — ABNORMAL LOW (ref 13.0–17.0)
Lymphocytes Relative: 37 %
Lymphs Abs: 1.8 10*3/uL (ref 0.7–4.0)
MCH: 30.2 pg (ref 26.0–34.0)
MCHC: 32.6 g/dL (ref 30.0–36.0)
MCV: 92.5 fL (ref 78.0–100.0)
MONO ABS: 0.4 10*3/uL (ref 0.1–1.0)
Monocytes Relative: 7 %
Neutro Abs: 2.6 10*3/uL (ref 1.7–7.7)
Neutrophils Relative %: 54 %
PLATELETS: 297 10*3/uL (ref 150–400)
RBC: 4.14 MIL/uL — ABNORMAL LOW (ref 4.22–5.81)
RDW: 14 % (ref 11.5–15.5)
WBC: 4.9 10*3/uL (ref 4.0–10.5)

## 2017-02-02 LAB — CK: Total CK: 214 U/L (ref 49–397)

## 2017-02-02 LAB — BUN: BUN: 20 mg/dL (ref 6–20)

## 2017-02-02 LAB — CREATININE, SERUM
CREATININE: 0.99 mg/dL (ref 0.61–1.24)
GFR calc Af Amer: >60 mL/min (ref >60)

## 2017-02-03 ENCOUNTER — Encounter (HOSPITAL_COMMUNITY): Payer: Self-pay

## 2017-02-03 ENCOUNTER — Ambulatory Visit (HOSPITAL_COMMUNITY): Payer: 59

## 2017-02-03 DIAGNOSIS — M25511 Pain in right shoulder: Secondary | ICD-10-CM

## 2017-02-03 DIAGNOSIS — R29898 Other symptoms and signs involving the musculoskeletal system: Secondary | ICD-10-CM | POA: Diagnosis not present

## 2017-02-03 DIAGNOSIS — M25611 Stiffness of right shoulder, not elsewhere classified: Secondary | ICD-10-CM

## 2017-02-03 NOTE — Patient Instructions (Addendum)
Perform each exercise ________ reps. 2-3x days.   Protraction - STANDING  Start by holding a wand or cane at chest height.  Next, slowly push the wand outwards in front of your body so that your elbows become fully straightened. Then, return to the original position.     Shoulder FLEXION - STANDING - PALMS UP  In the standing position, hold a wand/cane with both arms, palms up on both sides. Raise up the wand/cane allowing your unaffected arm to perform most of the effort. Your affected arm should be partially relaxed.      Internal/External ROTATION - STANDING  In the standing position, hold a wand/cane with both hands keeping your elbows bent. Move your arms and wand/cane to one side.  Your affected arm should be partially relaxed while your unaffected arm performs most of the effort.       Shoulder ABDUCTION - STANDING  While holding a wand/cane palm face up on the injured side and palm face down on the uninjured side, slowly raise up your injured arm to the side.        Horizontal Abduction/Adduction      Straight arms holding cane at shoulder height, bring cane to right, center, left. Repeat starting to left.   Copyright  VHI. All rights reserved.       

## 2017-02-03 NOTE — Therapy (Signed)
[facility] [address] [place], [place], [address] Phone: [phone]   Fax:  [phone]  Occupational Therapy Treatment  Patient Details  Name: Jacob Arellano MRN: [id number] Date of Birth: 08/25/1956 Referring Provider: Dr. Judye Bos  Encounter Date: 02/03/2017      OT End of Session - 02/03/17 1204    Visit Number 2   Number of Visits 16   Date for OT Re-Evaluation 03/29/17  mini-reassessment: 02/25/17   Authorization Type [employer]    Authorization Time Period 100% covered   OT Start Time 1120   OT Stop Time 1201   OT Time Calculation (min) 41 min   Activity Tolerance Patient tolerated treatment well   Behavior During Therapy [facility] for tasks assessed/performed      Past Medical History:  Diagnosis Date  . Depression   . Hypercholesteremia   . Hypertension   . Sleep apnea     Past Surgical History:  Procedure Laterality Date  . BACK SURGERY    . broken nose    . CATARACT EXTRACTION W/PHACO Right 11/01/2012   Procedure: CATARACT EXTRACTION PHACO AND INTRAOCULAR LENS PLACEMENT (IOC);  Surgeon: Tonny Branch, MD;  Location: [facility] OR[facility];  Service: Ophthalmology;  Laterality: Right;  CDE=7.50  . CATARACT EXTRACTION W/PHACO Left 11/15/2012   Procedure: CATARACT EXTRACTION PHACO AND INTRAOCULAR LENS PLACEMENT (IOC);  Surgeon: Tonny Branch, MD;  Location: [facility] ORS;  Service: Ophthalmology;  Laterality: Left;  CDE 6.68  . COLONOSCOPY N/A 10/31/2016   Procedure: COLONOSCOPY;  Surgeon: Danie Binder, MD;  Location: [facility] ENDO SUITE;  Service: Endoscopy;  Laterality: N/A;  11:45 pm  . GANGLION CYST EXCISION     Bilateral hands  . left bicep repair    . NASAL SEPTUM SURGERY    . ROTATOR CUFF REPAIR     right  . TONSILLECTOMY    . VITRECTOMY      There were no vitals filed for this visit.      Subjective Assessment - 02/03/17 1122    Subjective  S: I have been doing my exercises.   Currently in Pain? No/denies            [facility] OT  Assessment - 02/03/17 1123      Assessment   Diagnosis R Shoulder RTC Tear Arthroscopr     Precautions   Precautions Shoulder   Type of Shoulder Precautions Dr. Judye Bos protocol: P/ROM until 02/10/17 then begin AA/ROM until 02/24/17 then begin A/ROM until 03/24/17 and begin strengthening   Required Braces or Orthoses Sling                  OT Treatments/Exercises (OP) - 02/03/17 1123      Exercises   Exercises Shoulder;Elbow;Wrist     Shoulder Exercises: Supine   Protraction PROM;5 reps   Horizontal ABduction PROM;5 reps   External Rotation PROM;5 reps   Internal Rotation PROM;5 reps   Flexion PROM;5 reps   ABduction PROM;5 reps     Shoulder Exercises: Therapy Ball   Flexion 15 reps   ABduction 15 reps     Shoulder Exercises: Isometric Strengthening   Flexion 5X5"   Extension 5X5"   External Rotation 5X5"   Internal Rotation 5X5"   ABduction 5X5"   ADduction 5X5"     Elbow Exercises   Forearm Supination AROM;10 reps   Forearm Pronation AROM;10 reps   Wrist Flexion AROM;10 reps   Wrist Extension AROM;10 reps     Wrist  Exercises   Wrist Radial Deviation --   Wrist Ulnar Deviation --     Manual Therapy   Manual Therapy Myofascial release   Manual therapy comments manual therapy completed seperately from all other interventions this date    Myofascial Release myofascial release and manual stretching to right upper arm, scapular and shoulder region to decrease pain and improve pain free mobility.                 OT Education - 02/03/17 1155    Education provided Yes   Education Details Pt provided with HEP of AA/ROM exercises to begin after 02/10/17, pt given evaulation paperwork   Person(s) Educated Patient   Methods Explanation;Demonstration;Handout   Comprehension Verbalized understanding;Returned demonstration          OT Short Term Goals - 02/03/17 1205      OT SHORT TERM GOAL #1   Title Pt will understand and be independent with the  HEP provided to facilitate his progress with ROM and strength.   Time 4   Period Weeks   Status On-going     OT SHORT TERM GOAL #2   Title Pt will have strength of 3+/5 to assist in his right arm for ADL tasks such as lightweight household activities.   Time 4   Period Weeks   Status On-going     OT SHORT TERM GOAL #3   Title Patuients P/ROM of right arm will be within normal limits to increase his ability to complete overhead reaching activities.   Time 4   Period Weeks   Status On-going     OT SHORT TERM GOAL #4   Title Patient will have decreased pain in his right shoulder to 2/10 or better with activity.    Time 6   Period Weeks   Status On-going     OT SHORT TERM GOAL #5   Title Patient will decrease fascial restrictions to moderate in his right shoulder region for greater mobility needed for ADL completion.    Time 6   Period Weeks   Status On-going           OT Long Term Goals - 02/03/17 1205      OT LONG TERM GOAL #1   Title Pt will return to prior level of independence with all daily and work activities using his right arm as dominant.    Time 8   Period Weeks   Status On-going     OT LONG TERM GOAL #2   Title Pt will have decreased fascial restrictions to zero in his RUE to relieve tightness and tenderness to facilitate better ROM.   Time 8   Period Weeks   Status On-going     OT LONG TERM GOAL #3   Title Pt will have 2/10 or less for pain when using RUE while completing daily activities.    Time 8   Period Weeks   Status On-going     OT LONG TERM GOAL #4   Title Pt will have strength of 4+/5 to assist his right arm for ADL tasks such as yard work and heavy household chores.   Time 8   Period Weeks   Status On-going     OT LONG TERM GOAL #5   Title Patients A/ROM of right arm will be within normal limits to increase his ability to complete all overhead reaching activities.   Time 8   Period Weeks   Status On-going  Plan  - 02/03/17 1208    Clinical Impression Statement A: Initiated myofasical release, isometrics and P/ROM in RUE. Therapy ball stretches completed with VC needed for form and technique. Per MD protocol, only P/ROM until 02/10/17. Pt reports he will be going out of town next week and did not have another appt scheduled until 02/16/17. Pt requested an appt for 02/04/17. Provided AA/ROM exercises for pt to progress to during vaction.   Plan P: Continue with P/ROM and therapy ball exercises. Attempt low level thumb tacks.   OT Home Exercise Plan 01/28/17: towel slides, pendulums, A/ROM for elbow and wrist; 02/03/17: AA/ROM exercises to start on 02/10/17      Patient will benefit from skilled therapeutic intervention in order to improve the following deficits and impairments:  Decreased range of motion, Increased fascial restricitons, Decreased scar mobility, Impaired UE functional use, Decreased strength, Pain  Visit Diagnosis: Other symptoms and signs involving the musculoskeletal system  Acute pain of right shoulder  Stiffness of right shoulder, not elsewhere classified    Problem List Patient Active Problem List   Diagnosis Date Noted  . Special screening for malignant neoplasms, colon     Luther Hearing, OT Student [phone] 02/03/2017, 3:12 PM  [facility] [address] [place], [place], [address] Phone: [phone]   Fax:  [phone]  Name: Jacob Arellano MRN: [id number] Date of Birth: 07-06-1956     Note reviewed by clinical instructor and accurately reflects treatment session.   Ailene Ravel, OTR/L,CBIS  [phone]

## 2017-02-04 ENCOUNTER — Ambulatory Visit (HOSPITAL_COMMUNITY): Payer: 59 | Admitting: Occupational Therapy

## 2017-02-04 ENCOUNTER — Telehealth (HOSPITAL_COMMUNITY): Payer: Self-pay | Admitting: Internal Medicine

## 2017-02-04 NOTE — Telephone Encounter (Signed)
02/04/17 pt cx he said that his arm was too sore today to come to therapy

## 2017-02-14 ENCOUNTER — Other Ambulatory Visit (HOSPITAL_COMMUNITY)
Admission: RE | Admit: 2017-02-14 | Discharge: 2017-02-14 | Disposition: A | Discharge status: Home / Self Care | Payer: 59 | Attending: *Deleted | Admitting: *Deleted

## 2017-02-14 DIAGNOSIS — Z79899 Other long term (current) drug therapy: Secondary | ICD-10-CM | POA: Diagnosis not present

## 2017-02-14 DIAGNOSIS — I10 Essential (primary) hypertension: Secondary | ICD-10-CM | POA: Diagnosis not present

## 2017-02-14 DIAGNOSIS — M791 Myalgia: Secondary | ICD-10-CM | POA: Diagnosis not present

## 2017-02-14 DIAGNOSIS — Z7982 Long term (current) use of aspirin: Secondary | ICD-10-CM | POA: Insufficient documentation

## 2017-02-14 DIAGNOSIS — M12811 Other specific arthropathies, not elsewhere classified, right shoulder: Secondary | ICD-10-CM

## 2017-02-14 DIAGNOSIS — B999 Unspecified infectious disease: Secondary | ICD-10-CM

## 2017-02-14 DIAGNOSIS — R509 Fever, unspecified: Secondary | ICD-10-CM | POA: Insufficient documentation

## 2017-02-14 LAB — CBC WITH DIFFERENTIAL/PLATELET
BASOS PCT: 1 %
Basophils Absolute: 0 10*3/uL (ref 0.0–0.1)
EOS ABS: 0.2 10*3/uL (ref 0.0–0.7)
Eosinophils Relative: 4 %
HEMATOCRIT: 34.1 % — AB (ref 39.0–52.0)
HEMOGLOBIN: 11.5 g/dL — AB (ref 13.0–17.0)
Lymphocytes Relative: 12 %
Lymphs Abs: 0.5 10*3/uL — ABNORMAL LOW (ref 0.7–4.0)
MCH: 30.7 pg (ref 26.0–34.0)
MCHC: 33.7 g/dL (ref 30.0–36.0)
MCV: 90.9 fL (ref 78.0–100.0)
Monocytes Absolute: 0.4 10*3/uL (ref 0.1–1.0)
Monocytes Relative: 10 %
NEUTROS ABS: 3 10*3/uL (ref 1.7–7.7)
NEUTROS PCT: 73 %
Platelets: 227 10*3/uL (ref 150–400)
RBC: 3.75 MIL/uL — ABNORMAL LOW (ref 4.22–5.81)
RDW: 13.9 % (ref 11.5–15.5)
WBC: 4.1 10*3/uL (ref 4.0–10.5)

## 2017-02-14 LAB — CREATININE, SERUM
Creatinine, Ser: 1.17 mg/dL (ref 0.61–1.24)
GFR calc non Af Amer: >60 mL/min (ref >60)

## 2017-02-14 LAB — CK: Total CK: 344 U/L (ref 49–397)

## 2017-02-14 LAB — BUN: BUN: 19 mg/dL (ref 6–20)

## 2017-02-15 ENCOUNTER — Emergency Department (HOSPITAL_COMMUNITY): Payer: 59

## 2017-02-15 ENCOUNTER — Emergency Department (HOSPITAL_COMMUNITY)
Admission: EM | Admit: 2017-02-15 | Discharge: 2017-02-15 | Disposition: A | Discharge status: Other Acute Inpatient Hospital | Payer: 59 | Attending: Emergency Medicine | Admitting: Emergency Medicine

## 2017-02-15 ENCOUNTER — Encounter (HOSPITAL_COMMUNITY): Payer: Self-pay | Admitting: *Deleted

## 2017-02-15 DIAGNOSIS — R509 Fever, unspecified: Secondary | ICD-10-CM

## 2017-02-15 LAB — COMPREHENSIVE METABOLIC PANEL
ALBUMIN: 3.9 g/dL (ref 3.5–5.0)
ALT: 125 U/L — ABNORMAL HIGH (ref 17–63)
ANION GAP: 9 (ref 5–15)
AST: 129 U/L — ABNORMAL HIGH (ref 15–41)
Alkaline Phosphatase: 169 U/L — ABNORMAL HIGH (ref 38–126)
BUN: 19 mg/dL (ref 6–20)
CO2: 25 mmol/L (ref 22–32)
Calcium: 9.3 mg/dL (ref 8.9–10.3)
Chloride: 104 mmol/L (ref 101–111)
Creatinine, Ser: 1.19 mg/dL (ref 0.61–1.24)
GFR calc non Af Amer: >60 mL/min (ref >60)
GLUCOSE: 125 mg/dL — AB (ref 65–99)
POTASSIUM: 3.8 mmol/L (ref 3.5–5.1)
SODIUM: 138 mmol/L (ref 135–145)
TOTAL PROTEIN: 6.9 g/dL (ref 6.5–8.1)
Total Bilirubin: 0.8 mg/dL (ref 0.3–1.2)

## 2017-02-15 LAB — CBC WITH DIFFERENTIAL/PLATELET
BASOS ABS: 0 10*3/uL (ref 0.0–0.1)
Basophils Relative: 1 %
Eosinophils Absolute: 0.1 10*3/uL (ref 0.0–0.7)
Eosinophils Relative: 3 %
HEMATOCRIT: 33 % — AB (ref 39.0–52.0)
HEMOGLOBIN: 11.2 g/dL — AB (ref 13.0–17.0)
LYMPHS PCT: 13 %
Lymphs Abs: 0.5 10*3/uL — ABNORMAL LOW (ref 0.7–4.0)
MCH: 30.4 pg (ref 26.0–34.0)
MCHC: 33.9 g/dL (ref 30.0–36.0)
MCV: 89.7 fL (ref 78.0–100.0)
MONO ABS: 0.3 10*3/uL (ref 0.1–1.0)
Monocytes Relative: 8 %
NEUTROS ABS: 3 10*3/uL (ref 1.7–7.7)
NEUTROS PCT: 75 %
Platelets: 205 10*3/uL (ref 150–400)
RBC: 3.68 MIL/uL — ABNORMAL LOW (ref 4.22–5.81)
RDW: 13.7 % (ref 11.5–15.5)
WBC: 4 10*3/uL (ref 4.0–10.5)

## 2017-02-15 LAB — URINALYSIS, ROUTINE W REFLEX MICROSCOPIC
BACTERIA UA: NONE SEEN
Bilirubin Urine: NEGATIVE
Glucose, UA: NEGATIVE mg/dL
Hgb urine dipstick: NEGATIVE
Ketones, ur: NEGATIVE mg/dL
Leukocytes, UA: NEGATIVE
NITRITE: NEGATIVE
PROTEIN: 30 mg/dL — AB
SPECIFIC GRAVITY, URINE: 1.026 (ref 1.005–1.030)
SQUAMOUS EPITHELIAL / LPF: NONE SEEN
pH: 6 (ref 5.0–8.0)

## 2017-02-15 LAB — I-STAT CG4 LACTIC ACID, ED: Lactic Acid, Venous: 1.27 mmol/L (ref 0.5–1.9)

## 2017-02-15 LAB — CK: CK TOTAL: 282 U/L (ref 49–397)

## 2017-02-15 MED ORDER — SODIUM CHLORIDE 0.9 % IV BOLUS (SEPSIS)
1000.0000 mL | Freq: Once | INTRAVENOUS | Status: AC
  Administered 2017-02-15: 1000 mL via INTRAVENOUS

## 2017-02-15 MED ORDER — SODIUM CHLORIDE 0.9 % IV BOLUS (SEPSIS): 1000.0000 mL | Freq: Once | INTRAVENOUS | Status: DC

## 2017-02-15 NOTE — ED Triage Notes (Signed)
Pt currently receiving iv antibiotics through a picc line for positive cultures from shoulder surgery, began having chills, fever, generalized body aches a few days ago, was told by [facility] hospital to follow up in er due to elevated blood work from today,

## 2017-02-15 NOTE — ED Provider Notes (Signed)
[facility] DEPT Provider Note   CSN: [id number] Arrival date & time: 02/14/17  2351     History   Chief Complaint Chief Complaint  Patient presents with  . Fever    HPI Jacob Arellano is a 61 y.o. male.  Patient presents to the emergency department for evaluation of fever. Patient reports that he gets [place] from the beach. For the last couple of days he has been feeling generalized aches, not quite like himself. He took his temperature tonight and it was elevated. Patient has a copy to recent medical history. He underwent reconstructive surgery of his right shoulder at [facility], synovial fluid cultures were positive for P. acnes. He has a PICC line in his left arm. He is on IV daptomycin and oral rifampin. He was called today by the pharmacy at [facility] because routine blood work performed earlier today revealed that his CPKs are elevated, possibly from the antibiotics. He was told to come to the ER at [facility] for evaluation of his fever, including culturing of his PICC line. He reports that he was unable to get the [facility], came to this ER instead. He denies any fever, cough, congestion. No urinary symptoms, nausea, vomiting or diarrhea. He has not noticed any redness, swelling or drainage from around the PICC line.      Past Medical History:  Diagnosis Date  . Depression   . Hypercholesteremia   . Hypertension   . Sleep apnea     Patient Active Problem List   Diagnosis Date Noted  . Special screening for malignant neoplasms, colon     Past Surgical History:  Procedure Laterality Date  . BACK SURGERY    . broken nose    . CATARACT EXTRACTION W/PHACO Right 11/01/2012   Procedure: CATARACT EXTRACTION PHACO AND INTRAOCULAR LENS PLACEMENT (IOC);  Surgeon: Tonny Branch, MD;  Location: [facility];  Service: Ophthalmology;  Laterality: Right;  CDE=7.50  . CATARACT EXTRACTION W/PHACO Left 11/15/2012   Procedure: CATARACT EXTRACTION PHACO AND INTRAOCULAR LENS PLACEMENT (IOC);   Surgeon: Tonny Branch, MD;  Location: [facility] ORS;  Service: Ophthalmology;  Laterality: Left;  CDE 6.68  . COLONOSCOPY N/A 10/31/2016   Procedure: COLONOSCOPY;  Surgeon: Danie Binder, MD;  Location: [facility] ENDO SU[facility];  Service: Endoscopy;  Laterality: N/A;  11:45 pm  . GANGLION CYST EXCISION     Bilateral hands  . left bicep repair    . NASAL SEPTUM SURGERY    . ROTATOR CUFF REPAIR     right  . TONSILLECTOMY    . VITRECTOMY         Home Medications    Prior to Admission medications   Medication Sig Start Date End Date Taking? Authorizing Provider  aspirin EC 81 MG tablet Take 81 mg by mouth every morning.    Yes [provider]  citalopram (CELEXA) 20 MG tablet Take 20 mg by mouth at bedtime.    Yes [provider]  daptomycin (CUBICIN) IVPB Inject 750 mg into the vein daily.   Yes [provider]  docusate sodium (COLACE) 100 MG capsule Take 100 mg by mouth 2 (two) times daily.   Yes [provider]  fenofibrate (TRICOR) 48 MG tablet Take 48 mg by mouth at bedtime.    Yes [provider]  lamoTRIgine (LAMICTAL) 150 MG tablet Take 150 mg by mouth 2 (two) times daily. 08/14/16  Yes [provider]  lisdexamfetamine (VYVANSE) 40 MG capsule Take 40 mg by mouth every morning.  Yes [provider]  lisinopril-hydrochlorothiazide (PRINZIDE,ZESTORETIC) 10-12.5 MG per tablet Take 1 tablet by mouth every morning.    Yes [provider]  Multiple Vitamins-Minerals (CENTRUM MEN PO) Take 1 tablet by mouth daily.   Yes [provider]  oxyCODONE (OXY IR/ROXICODONE) 5 MG immediate release tablet Take 5 mg by mouth every 4 (four) hours as needed for severe pain (pt takes 5-15mg  depending on pain level).   Yes [provider]  rifampin (RIFADIN) 300 MG capsule Take 300 mg by mouth 2 (two) times daily.   Yes [provider]  simvastatin (ZOCOR) 40 MG tablet Take 40 mg by mouth every evening.   Yes [provider]    Family History Family History  Problem Relation Age of Onset  . Colon cancer Neg Hx     Social History Social History  Substance Use Topics  . Smoking status: Never Smoker  . Smokeless tobacco: Never Used  . Alcohol use 3.0 oz/week    5 Cans of beer per week     Allergies   Patient has no known allergies.   Review of Systems Review of Systems  Constitutional: Positive for chills and fever.  Musculoskeletal: Positive for myalgias.  All other systems reviewed and are negative.    Physical Exam Updated Vital Signs BP 108/71   Pulse 82   Temp (!) 100.5 F (38.1 C) (Oral)   Resp 17   Ht 6\' 1"  (1.854 m)   Wt 93 kg (205 lb)   SpO2 95%   BMI 27.05 kg/m   Physical Exam  Constitutional: He is oriented to person, place, and time. He appears well-developed and well-nourished. No distress.  HENT:  Head: Normocephalic and atraumatic.  Right Ear: Hearing normal.  Left Ear: Hearing normal.  Nose: Nose normal.  Mouth/Throat: Oropharynx is clear and moist and mucous membranes are normal.  Eyes: Conjunctivae and EOM are normal. Pupils are equal, round, and reactive to light.  Neck: Normal range of motion. Neck supple.  Cardiovascular: Regular rhythm, S1 normal and S2 normal.  Exam reveals no gallop and no friction rub.   No murmur heard. Pulmonary/Chest: Effort normal and breath sounds normal. No respiratory distress. He exhibits no tenderness.  Abdominal: Soft. Normal appearance and bowel sounds are normal. There is no hepatosplenomegaly. There is no tenderness. There is no rebound, no guarding, no tenderness at McBurney's point and negative Murphy's sign. No hernia.  Musculoskeletal: Normal range of motion.  Neurological: He is alert and oriented to person, place, and time. He has normal strength. No cranial nerve deficit or sensory deficit. Coordination normal. GCS eye subscore is 4. GCS verbal subscore is 5. GCS motor subscore is 6.  Skin: Skin is warm,  dry and intact. No rash noted. No cyanosis.  Psychiatric: He has a normal mood and affect. His speech is normal and behavior is normal. Thought content normal.  Nursing note and vitals reviewed.    ED Treatments / Results  Labs (all labs ordered are listed, but only abnormal results are displayed) Labs Reviewed  COMPREHENSIVE METABOLIC PANEL - Abnormal; Notable for the following:       Result Value   Glucose, Bld 125 (*)    AST 129 (*)    ALT 125 (*)    Alkaline Phosphatase 169 (*)    All other components within normal limits  CBC WITH DIFFERENTIAL/PLATELET - Abnormal; Notable for the following:    RBC 3.68 (*)    Hemoglobin 11.2 (*)  HCT 33.0 (*)    Lymphs Abs 0.5 (*)    All other components within normal limits  URINALYSIS, ROUTINE W REFLEX MICROSCOPIC - Abnormal; Notable for the following:    Color, Urine AMBER (*)    APPearance HAZY (*)    Protein, ur 30 (*)    All other components within normal limits  CULTURE, BLOOD (ROUTINE X 2)  CULTURE, BLOOD (ROUTINE X 2)  URINE CULTURE  CATH TIP CULTURE  CK  I-STAT CG4 LACTIC ACID, ED    EKG  EKG Interpretation  Date/Time:  Sunday February 15 2017 00:37:29 EDT Ventricular Rate:  96 PR Interval:    QRS Duration: 91 QT Interval:  327 QTC Calculation: 414 R Axis:   77 Text Interpretation:  Sinus rhythm Normal ECG Confirmed by Orpah Greek ([id number]) on 02/15/2017 12:44:45 AM       Radiology Dg Chest 2 View  Result Date: 02/15/2017 CLINICAL DATA:  Fever. Technologist notes state patient currently receiving IV antibiotics for positive cultures from shoulder surgery. Chills, fever and generalized body aches for few days. EXAM: CHEST  2 VIEW COMPARISON:  None. FINDINGS: Left upper extremity PICC tip in the region of the distal brachiocephalic vein/ proximal SVC. Minimal bibasilar atelectasis. No consolidation. Normal heart size and mediastinal contours. No pulmonary edema or pleural fluid. No pneumothorax. No acute  osseous abnormalities are seen. IMPRESSION: Left upper extremity PICC tip in the distal brachiocephalic vein/proximal SVC. Minimal bibasilar atelectasis, no consolidation to suggest pneumonia. Electronically Signed   By: Jeb Levering M.D.   On: 02/15/2017 01:21    Procedures Procedures (including critical care time)  Medications Ordered in ED Medications  sodium chloride 0.9 % bolus 1,000 mL (1,000 mLs Intravenous New Bag/Given 02/15/17 0051)    And  sodium chloride 0.9 % bolus 1,000 mL (0 mLs Intravenous Stopped 02/15/17 0213)     Initial Impression / Assessment and Plan / ED Course  I have reviewed the triage vital signs and the nursing notes.  Pertinent labs & imaging results that were available during my care of the patient were reviewed by me and considered in my medical decision making (see chart for details).     Patient presents with concerns over fever. He has a complicated past medical history. Patient had reconstructive surgery of his right shoulder at [facility] and surgical pathology taken at the time revealed synovial infection. He has been on IV daptomycin and oral rifampin, followed by infectious disease at [facility]. Patient reports that he has not been feeling well for the last couple of days, finally took his temperature tonight and it was elevated.  Patient also received a call from pharmacy at [facility] with concern over elevated CPK. There were concerned that this was secondary to the IV antibiotics. It was recommended that he go to [facility] ER to be evaluated. Patient reported that he did not have transportation to [place], drove himself to this ER.  Patient did have fever confirmed at arrival.At this time source is unclear. Chest x-ray does not show evidence of pneumonia. Urinalysis is negative. Patient's white blood count is 4.0. He does have elevated AST, ALT, alkaline phosphatase. He does not have any recent values from [facility] or here to compare. Unclear if this is  secondary to antibiotics. He is not experiencing abdominal pain, abdominal exam is benign.  I recommended removal of PICC line for culture, but patient did not want to have this removed until I discussed the care further with his infectious disease team  at [facility]. This was discussed with Dr. Robby Sermon, on call for ID, who recommended patient be transferred to [facility]. I did attempt to have the medicine service admit the patient, but they declined, stating that Dr. Robby Sermon would be the accepting physician and the patient should be sent to the ER. ER physician was notified of the patient's history here and reason for transfer. Patient appears well, no evidence of any significant sepsis at this time, is appropriate for transfer.  Final Clinical Impressions(s) / ED Diagnoses   Final diagnoses:  Fever, unspecified fever cause    New Prescriptions New Prescriptions   No medications on file     Orpah Greek, MD 02/15/17 [phone]

## 2017-02-15 NOTE — ED Notes (Signed)
Patient transported to X-ray 

## 2017-02-16 ENCOUNTER — Telehealth (HOSPITAL_COMMUNITY): Payer: Self-pay | Admitting: Student

## 2017-02-16 ENCOUNTER — Ambulatory Visit (HOSPITAL_COMMUNITY): Payer: 59

## 2017-02-16 LAB — URINE CULTURE: Culture: NO GROWTH

## 2017-02-16 NOTE — Telephone Encounter (Signed)
Discussed missed appointment with patient. Patient said that he was aware he missed the appointment but he has been admitted to [facility] due to problems he is having with his arm. He said to keep his next appointment for Thursday the 28th and he will call if he is unable to make it in.

## 2017-02-19 ENCOUNTER — Ambulatory Visit (HOSPITAL_COMMUNITY): Payer: 59

## 2017-02-19 ENCOUNTER — Telehealth (HOSPITAL_COMMUNITY): Payer: Self-pay | Admitting: Student

## 2017-02-19 NOTE — Telephone Encounter (Signed)
Called pt and discussed missed session. Pt reports he had been admitted to the hospital earlier this week due to problems with his arm but was released. Last night he was having trouble with his PICC line and got readmitted and had to have the line replaced this morning. Pt apologized for missing another session. Made pt aware of next session on 03/02/17 and to call if he could not make it. Pt said he will be at that appointment.  Luther Hearing, OT Student [phone]

## 2017-02-20 LAB — CULTURE, BLOOD (ROUTINE X 2)
CULTURE: NO GROWTH
CULTURE: NO GROWTH
SPECIAL REQUESTS: ADEQUATE

## 2017-02-23 ENCOUNTER — Other Ambulatory Visit (HOSPITAL_COMMUNITY)
Admission: AD | Admit: 2017-02-23 | Discharge: 2017-02-23 | Disposition: A | Discharge status: Home / Self Care | Payer: 59 | Source: Skilled Nursing Facility | Attending: Orthopedic Surgery | Admitting: Orthopedic Surgery

## 2017-02-23 DIAGNOSIS — B999 Unspecified infectious disease: Secondary | ICD-10-CM | POA: Diagnosis present

## 2017-02-23 LAB — CBC WITH DIFFERENTIAL/PLATELET
Basophils Absolute: 0.1 10*3/uL (ref 0.0–0.1)
Basophils Relative: 1 %
Eosinophils Absolute: 0.3 10*3/uL (ref 0.0–0.7)
Eosinophils Relative: 5 %
HEMATOCRIT: 35.2 % — AB (ref 39.0–52.0)
Hemoglobin: 11.6 g/dL — ABNORMAL LOW (ref 13.0–17.0)
LYMPHS ABS: 1.9 10*3/uL (ref 0.7–4.0)
LYMPHS PCT: 31 %
MCH: 30.1 pg (ref 26.0–34.0)
MCHC: 33 g/dL (ref 30.0–36.0)
MCV: 91.4 fL (ref 78.0–100.0)
MONO ABS: 0.5 10*3/uL (ref 0.1–1.0)
MONOS PCT: 9 %
NEUTROS ABS: 3.2 10*3/uL (ref 1.7–7.7)
Neutrophils Relative %: 54 %
PLATELETS: 444 10*3/uL — AB (ref 150–400)
RBC: 3.85 MIL/uL — ABNORMAL LOW (ref 4.22–5.81)
RDW: 14.1 % (ref 11.5–15.5)
WBC: 6 10*3/uL (ref 4.0–10.5)

## 2017-02-23 LAB — BUN: BUN: 23 mg/dL — ABNORMAL HIGH (ref 6–20)

## 2017-02-23 LAB — SEDIMENTATION RATE: Sed Rate: 35 mm/hr — ABNORMAL HIGH (ref 0–16)

## 2017-02-23 LAB — CREATININE, SERUM
Creatinine, Ser: 1.24 mg/dL (ref 0.61–1.24)
GFR calc Af Amer: >60 mL/min (ref >60)
GFR calc non Af Amer: >60 mL/min (ref >60)

## 2017-03-02 ENCOUNTER — Telehealth (HOSPITAL_COMMUNITY): Payer: Self-pay | Admitting: Internal Medicine

## 2017-03-02 ENCOUNTER — Ambulatory Visit (HOSPITAL_COMMUNITY): Payer: 59 | Attending: Orthopaedic Surgery | Admitting: Specialist

## 2017-03-02 ENCOUNTER — Other Ambulatory Visit (HOSPITAL_COMMUNITY)
Admission: RE | Admit: 2017-03-02 | Discharge: 2017-03-02 | Disposition: A | Discharge status: Home / Self Care | Payer: 59 | Source: Other Acute Inpatient Hospital | Attending: Internal Medicine | Admitting: Internal Medicine

## 2017-03-02 DIAGNOSIS — R29898 Other symptoms and signs involving the musculoskeletal system: Secondary | ICD-10-CM | POA: Insufficient documentation

## 2017-03-02 DIAGNOSIS — M25611 Stiffness of right shoulder, not elsewhere classified: Secondary | ICD-10-CM | POA: Insufficient documentation

## 2017-03-02 DIAGNOSIS — M25511 Pain in right shoulder: Secondary | ICD-10-CM | POA: Insufficient documentation

## 2017-03-02 DIAGNOSIS — B999 Unspecified infectious disease: Secondary | ICD-10-CM | POA: Insufficient documentation

## 2017-03-02 LAB — CK: Total CK: 162 U/L (ref 49–397)

## 2017-03-02 LAB — CBC WITH DIFFERENTIAL/PLATELET
Basophils Absolute: 0 10*3/uL (ref 0.0–0.1)
Basophils Relative: 0 %
EOS PCT: 3 %
Eosinophils Absolute: 0.1 10*3/uL (ref 0.0–0.7)
HCT: 34.6 % — ABNORMAL LOW (ref 39.0–52.0)
Hemoglobin: 11.5 g/dL — ABNORMAL LOW (ref 13.0–17.0)
LYMPHS ABS: 2 10*3/uL (ref 0.7–4.0)
LYMPHS PCT: 43 %
MCH: 30.1 pg (ref 26.0–34.0)
MCHC: 33.2 g/dL (ref 30.0–36.0)
MCV: 90.6 fL (ref 78.0–100.0)
MONO ABS: 0.3 10*3/uL (ref 0.1–1.0)
Monocytes Relative: 7 %
Neutro Abs: 2.1 10*3/uL (ref 1.7–7.7)
Neutrophils Relative %: 47 %
PLATELETS: 375 10*3/uL (ref 150–400)
RBC: 3.82 MIL/uL — ABNORMAL LOW (ref 4.22–5.81)
RDW: 13.7 % (ref 11.5–15.5)
WBC: 4.6 10*3/uL (ref 4.0–10.5)

## 2017-03-02 LAB — BUN: BUN: 20 mg/dL (ref 6–20)

## 2017-03-02 LAB — CREATININE, SERUM
Creatinine, Ser: 1.13 mg/dL (ref 0.61–1.24)
GFR calc non Af Amer: >60 mL/min (ref >60)

## 2017-03-02 NOTE — Telephone Encounter (Signed)
03/02/17 pt called at 9:39am to say he missed his appt because his home health nurse came ... He will be here at this next appt Thursday.

## 2017-03-05 ENCOUNTER — Ambulatory Visit (HOSPITAL_COMMUNITY): Payer: 59 | Admitting: Occupational Therapy

## 2017-03-09 ENCOUNTER — Ambulatory Visit (HOSPITAL_COMMUNITY): Payer: 59

## 2017-03-12 ENCOUNTER — Telehealth (HOSPITAL_COMMUNITY): Payer: Self-pay

## 2017-03-12 ENCOUNTER — Encounter (HOSPITAL_COMMUNITY): Payer: Self-pay

## 2017-03-12 ENCOUNTER — Ambulatory Visit (HOSPITAL_COMMUNITY): Payer: 59

## 2017-03-12 NOTE — Telephone Encounter (Signed)
7/19: Called patient regarding no show. Left message on voicemail informing patient that do to 3-4 no shows he will be discharged. Patient will require a new MD order if he wishes to continue with his therapy.   Ailene Ravel, OTR/L,CBIS  [phone]

## 2017-03-12 NOTE — Therapy (Signed)
[place], [place], [address] Phone: [phone]   Fax:  [phone]  Patient Details  Name: Jacob Arellano MRN: [id number] Date of Birth: [birth year] Referring Provider:  No ref. provider found  Encounter Date: 03/12/2017  OCCUPATIONAL THERAPY DISCHARGE SUMMARY  Visits from Start of Care: 2  Current functional level related to goals / functional outcomes: LONG TERM GOAL #1   Title Pt will return to prior level of independence with all daily and work activities using his right arm as dominant.   Time 8  Period Weeks  Status On-going     OT LONG TERM GOAL #2  Title Pt will have decreased fascial restrictions to zero in his RUE to relieve tightness and tenderness to facilitate better ROM.  Time 8  Period Weeks  Status On-going     OT LONG TERM GOAL #3  Title Pt will have 2/10 or less for pain when using RUE while completing daily activities.   Time 8  Period Weeks  Status On-going     OT LONG TERM GOAL #4  Title Pt will have strength of 4+/5 to assist his right arm for ADL tasks such as yard work and heavy household chores.  Time 8  Period Weeks  Status On-going     OT LONG TERM GOAL #5  Title Patients A/ROM of right arm will be within normal limits to increase his ability to complete all overhead reaching activities.  Time 8  Period Weeks  Status On-going      Remaining deficits: All deficits remain. Patient discharge from therapy due to 3-4 no shows per policy they will need a new MD referral if they wish to return.    Education / Equipment: Pt was given HEP at evaluation for RUE.  Plan: Patient agrees to discharge.  Patient goals were not met. Patient is being discharged due to not returning since the last visit.  ?????        Ailene Ravel, OTR/L,CBIS  [phone]  03/12/2017, 9:25 AM  [place], [place],  [address] Phone: [phone]   Fax:  [phone]

## 2017-03-16 ENCOUNTER — Ambulatory Visit (HOSPITAL_COMMUNITY): Payer: 59

## 2017-03-19 ENCOUNTER — Ambulatory Visit (HOSPITAL_COMMUNITY): Payer: 59

## 2017-03-23 ENCOUNTER — Ambulatory Visit (HOSPITAL_COMMUNITY): Payer: 59

## 2017-03-24 ENCOUNTER — Ambulatory Visit (HOSPITAL_COMMUNITY): Payer: 59 | Admitting: Specialist

## 2017-03-24 ENCOUNTER — Encounter (HOSPITAL_COMMUNITY): Payer: Self-pay | Admitting: Specialist

## 2017-03-24 DIAGNOSIS — M25511 Pain in right shoulder: Secondary | ICD-10-CM

## 2017-03-24 DIAGNOSIS — R29898 Other symptoms and signs involving the musculoskeletal system: Secondary | ICD-10-CM

## 2017-03-24 DIAGNOSIS — M25611 Stiffness of right shoulder, not elsewhere classified: Secondary | ICD-10-CM | POA: Diagnosis present

## 2017-03-24 NOTE — Therapy (Signed)
[place], [place], [address] Phone: [phone]   Fax:  [phone]  Occupational Therapy Evaluation  Patient Details  Name: Jacob Arellano MRN: [id number] Date of Birth: 1956-05-19 Referring Provider: Dr. Judye Bos  Encounter Date: 03/24/2017      OT End of Session - 03/24/17 1631    Visit Number 1   Number of Visits 16   Date for OT Re-Evaluation 05/23/17  mini reasess on 04/22/17   Authorization Type [employer]    Authorization Time Period 100% covered   OT Start Time 1120   OT Stop Time 1200   OT Time Calculation (min) 40 min   Activity Tolerance Patient tolerated treatment well   Behavior During Therapy [facility] for tasks assessed/performed      Past Medical History:  Diagnosis Date  . Depression   . Hypercholesteremia   . Hypertension   . Sleep apnea     Past Surgical History:  Procedure Laterality Date  . BACK SURGERY    . broken nose    . CATARACT EXTRACTION W/PHACO Right 11/01/2012   Procedure: CATARACT EXTRACTION PHACO AND INTRAOCULAR LENS PLACEMENT (IOC);  Surgeon: Tonny Branch, MD;  Location: [facility];  Service: Ophthalmology;  Laterality: Right;  CDE=7.50  . CATARACT EXTRACTION W/PHACO Left 11/15/2012   Procedure: CATARACT EXTRACTION PHACO AND INTRAOCULAR LENS PLACEMENT (IOC);  Surgeon: Tonny Branch, MD;  Location: [facility];  Service: Ophthalmology;  Laterality: Left;  CDE 6.68  . COLONOSCOPY N/A 10/31/2016   Procedure: COLONOSCOPY;  Surgeon: Jacob Binder, MD;  Location: [facility];  Service: Endoscopy;  Laterality: N/A;  11:45 pm  . GANGLION CYST EXCISION     Bilateral hands  . left bicep repair    . NASAL SEPTUM SURGERY    . ROTATOR CUFF REPAIR     right  . TONSILLECTOMY    . VITRECTOMY      There were no vitals filed for this visit.      Subjective Assessment - 03/24/17 1628    Subjective  S: I have had so much going on, an infection, I had to get a pic line, an eye infection.  the md  finally cleared me for comng back to therapy.     Pertinent History [name] is a 61 year old male who had right rotator cuff repair on 12/31/15. He was completing therapy, however had to stop due to several infections, PIC line placement, eye infection.  He has been cleared by Dr. Berenice Arellano to continue OT.  He is now 13 weeks post op.      Currently in Pain? No/denies           [facility] OT Assessment - 03/24/17 0001      Assessment   Diagnosis R Shoulder RTC Tear Arthroscopr   Referring Provider Dr. 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  difficult to reach overhead, behind back      Written Expression   Dominant Hand Right     Vision - History   Baseline Vision Wears glasses only for reading     Cognition   Overall Cognitive Status Within Functional Limits for tasks assessed     Sensation   Light Touch Appears Intact     Coordination   Gross Motor Movements are Fluid and Coordinated Yes   Fine Motor Movements are Fluid and Coordinated Yes     ROM / Strength   AROM / PROM / Strength AROM;PROM;Strength     Palpation   Palpation comment min fascial restrictions in right upper arm and shoulder region      AROM   Overall AROM Comments assessed in supine, external and internal rotaiton with shouldere adducted    AROM  Assessment Site Shoulder   Right/Left Shoulder Right   Right Shoulder Flexion 45 Degrees   Right Shoulder ABduction 44 Degrees   Right Shoulder Internal Rotation 80 Degrees   Right Shoulder External Rotation 80 Degrees     PROM   Overall PROM Comments assessed in supine, external and internal rotation with shoulder adducted   Right Shoulder Flexion 130 Degrees   Right Shoulder ABduction 110 Degrees   Right Shoulder Internal Rotation 85 Degrees   Right Shoulder External Rotation 80 Degrees     Strength   Overall Strength Unable to assess;Due to pain                  OT Treatments/Exercises (OP) - 03/24/17 0001      Exercises   Exercises Shoulder     Shoulder Exercises: Supine   Protraction PROM;5 reps;AAROM;10 reps   Horizontal ABduction PROM;5 reps;AAROM;10 reps   External Rotation PROM;5 reps;AAROM;10 reps   Internal Rotation PROM;5 reps;AAROM;10 reps   Flexion PROM;5 reps;AAROM;10 reps   ABduction PROM;5 reps;AAROM;10 reps     Shoulder Exercises: Seated   Elevation AROM;10 reps   Extension AROM;10 reps   Retraction AROM;10 reps     Shoulder Exercises: Therapy Ball   Flexion 10 reps   ABduction 10 reps     Manual Therapy   Manual Therapy Myofascial release   Manual therapy comments manual therapy completed seperately from all other interventions this date    Myofascial Release myofascial release and manual stretching to right upper arm, scapular and shoulder region to decrease pain and improve pain free mobility.                OT Education - 03/24/17 1631    Education provided Yes   Education Details continue towel slides   Person(s) Educated Patient   Methods Explanation;Demonstration;Handout   Comprehension Verbalized understanding;Returned demonstration          OT Short Term Goals - 03/24/17 1637      OT SHORT TERM GOAL #1   Title Pt will understand and be independent with the HEP provided to facilitate his progress with ROM and  strength.   Time 4   Period Weeks   Status On-going   Target Date 04/23/17     OT SHORT TERM GOAL #2   Title Pt will have strength of 3+/5 to assist in his right arm for ADL tasks such as lightweight household activities.  Time 4   Period Weeks   Status On-going     OT SHORT TERM GOAL #3   Title Patuients P/ROM of right arm will be within normal limits to increase his ability to complete overhead reaching activities.   Time 4   Period Weeks   Status On-going     OT SHORT TERM GOAL #4   Title Patient will have decreased pain in his right shoulder to 2/10 or better with activity.    Time 4   Period Weeks   Status On-going     OT SHORT TERM GOAL #5   Title Patient will decrease fascial restrictions to min- moderate in his right shoulder region for greater mobility needed for ADL completion.    Time 6   Period Weeks   Status On-going           OT Long Term Goals - 03/24/17 1637      OT LONG TERM GOAL #1   Title Pt will return to prior level of independence with all daily and work activities using his right arm as dominant.    Time 8   Period Weeks   Status On-going   Target Date 05/23/17     OT LONG TERM GOAL #2   Title Pt will have decreased fascial restrictions to zero in his RUE to relieve tightness and tenderness to facilitate better ROM.   Time 8   Period Weeks   Status On-going     OT LONG TERM GOAL #3   Title Pt will have 2/10 or less for pain when using RUE while completing daily activities.    Time 8   Period Weeks   Status On-going     OT LONG TERM GOAL #4   Title Pt will have strength of 4+/5 to assist his right arm for ADL tasks such as yard work and heavy household chores.   Time 8   Period Weeks   Status On-going     OT LONG TERM GOAL #5   Title Patients A/ROM of right arm will be within normal limits to increase his ability to complete all overhead reaching activities.   Time 8   Period Weeks   Status On-going               Plan  - 03/24/17 1632    Clinical Impression Statement A:  Patient presents status post several week break from therapy due to medical issues.  Patient able to complete therapy as tolerated per MD orders.  Patient has limitation is P/ROM, A/ROM, strength, and functional use of his right arm.     Occupational Profile and client history currently impacting functional performance motiviated and prior level of independence    Occupational performance deficits (Please refer to evaluation for details): ADL's;IADL's;Rest and Sleep;Work;Leisure;Social Participation   Rehab Potential Good   Current Impairments/barriers affecting progress: history of prior right rotator cuff repair   OT Frequency 2x / week   OT Duration 8 weeks   OT Treatment/Interventions Self-care/ADL training;Moist Heat;Splinting;Patient/family education;Ultrasound;Therapeutic exercise;Scar mobilization;Therapeutic activities;Cryotherapy;Iontophoresis;Passive range of motion;Electrical Stimulation;Manual Therapy;DME and/or AE instruction   Plan P:  Begin AA/ROM and P/ROM as tolerated, upgrade HEP to AA/ROM   Clinical Decision Making Limited treatment options, no task modification necessary   OT Home Exercise Plan 03/23/17: towel slides   Consulted and Agree with Plan of Care Patient      Patient will benefit from skilled therapeutic intervention in order to improve the following deficits and impairments:  Decreased  range of motion, Increased fascial restricitons, Decreased scar mobility, Impaired UE functional use, Decreased strength, Pain  Visit Diagnosis: Other symptoms and signs involving the musculoskeletal system - Plan: Ot plan of care cert/re-cert  Acute pain of right shoulder - Plan: Ot plan of care cert/re-cert  Stiffness of right shoulder, not elsewhere classified - Plan: Ot plan of care cert/re-cert    Problem List Patient Active Problem List   Diagnosis Date Noted  . Special screening for malignant neoplasms, colon      Vangie Bicker, [place], OTR/L [phone]  03/24/2017, 4:45 PM  [place] [address] [place], [place], [address] Phone: [phone]   Fax:  [phone]  Name: JAYDEEN ODOR MRN: [id number] Date of Birth: 1956-02-15

## 2017-03-26 ENCOUNTER — Ambulatory Visit (HOSPITAL_COMMUNITY): Payer: 59 | Attending: Orthopaedic Surgery | Admitting: Specialist

## 2017-03-26 ENCOUNTER — Encounter (HOSPITAL_COMMUNITY): Payer: Self-pay | Admitting: Specialist

## 2017-03-26 DIAGNOSIS — M25511 Pain in right shoulder: Secondary | ICD-10-CM | POA: Insufficient documentation

## 2017-03-26 DIAGNOSIS — M25611 Stiffness of right shoulder, not elsewhere classified: Secondary | ICD-10-CM | POA: Diagnosis present

## 2017-03-26 DIAGNOSIS — R29898 Other symptoms and signs involving the musculoskeletal system: Secondary | ICD-10-CM | POA: Diagnosis present

## 2017-03-26 NOTE — Therapy (Signed)
[place], [place], [address] Phone: [phone]   Fax:  [phone]  Occupational Therapy Treatment  Patient Details  Name: Jacob Arellano MRN: [id number] Date of Birth: 22-Aug-1956 Referring Provider: Dr. Judye Bos  Encounter Date: 03/26/2017      OT End of Session - 03/26/17 1134    Visit Number 2   Number of Visits 16   Date for OT Re-Evaluation 05/23/17  mini reasess on 04/22/17   Authorization Type U[employer]    Authorization Time Period 100% covered   OT Start Time 1120   OT Stop Time 1200   OT Time Calculation (min) 40 min   Activity Tolerance Patient tolerated treatment well   Behavior During Therapy [facility] for tasks assessed/performed      Past Medical History:  Diagnosis Date  . Depression   . Hypercholesteremia   . Hypertension   . Sleep apnea     Past Surgical History:  Procedure Laterality Date  . BACK SURGERY    . broken nose    . CATARACT EXTRACTION W/PHACO Right 11/01/2012   Procedure: CATARACT EXTRACTION PHACO AND INTRAOCULAR LENS PLACEMENT (IOC);  Surgeon: Tonny Branch, MD;  Location: [facility];  Service: Ophthalmology;  Laterality: Right;  CDE=7.50  . CATARACT EXTRACTION W/PHACO Left 11/15/2012   Procedure: CATARACT EXTRACTION PHACO AND INTRAOCULAR LENS PLACEMENT (IOC);  Surgeon: Tonny Branch, MD;  Location: [facility];  Service: Ophthalmology;  Laterality: Left;  CDE 6.68  . COLONOSCOPY N/A 10/31/2016   Procedure: COLONOSCOPY;  Surgeon: Danie Binder, MD;  Location: [facility];  Service: Endoscopy;  Laterality: N/A;  11:45 pm  . GANGLION CYST EXCISION     Bilateral hands  . left bicep repair    . NASAL SEPTUM SURGERY    . ROTATOR CUFF REPAIR     right  . TONSILLECTOMY    . VITRECTOMY      There were no vitals filed for this visit.      Subjective Assessment - 03/26/17 1132    Subjective  A: I still have the infection in my arm. I have to be on an antibiotic for 3 weeks.   Currently in  Pain? No/denies            [facility] OT Assessment - 03/26/17 1133      Assessment   Diagnosis R Shoulder RTC Tear Arthroscopr     Precautions   Precautions Shoulder   Type of Shoulder Precautions progress as tolerated                  OT Treatments/Exercises (OP) - 03/26/17 1133      Exercises   Exercises Shoulder     Shoulder Exercises: Supine   Protraction PROM;5 reps;AAROM;15 reps   Horizontal ABduction PROM;5 reps;AAROM;15 reps   External Rotation PROM;5 reps;AAROM;15 reps   Internal Rotation PROM;5 reps;AAROM;15 reps   Flexion PROM;5 reps;AAROM;15 reps   ABduction PROM;5 reps;AAROM;15 reps     Shoulder Exercises: Standing   Protraction AAROM;10 reps   Horizontal ABduction --  unable to complete   External Rotation AAROM;10 reps   Internal Rotation AAROM;10 reps   Flexion AAROM;10 reps   ABduction AAROM;10 reps     Shoulder Exercises: Pulleys   Flexion 1 minute   ABduction 1 minute     Shoulder Exercises: Therapy Ball   Right/Left 5 reps     Shoulder Exercises: ROM/Strengthening   Wall Wash 1'   Proximal Shoulder Strengthening, Supine  15X 1 rest break   Proximal Shoulder Strengthening, Seated 10X no rest breaks     Manual Therapy   Manual Therapy Myofascial release   Manual therapy comments manual therapy completed seperately from all other interventions this date    Myofascial Release myofascial release and manual stretching to right upper arm, scapular and shoulder region to decrease pain and improve pain free mobility.                 OT Education - 03/26/17 1152    Education provided Yes   Education Details Pt ok to stop table slides. Pt was given AA/ROM exercises and instructed to complete them supine for now.   Person(s) Educated Patient   Methods Explanation;Demonstration;Verbal cues;Handout   Comprehension Returned demonstration;Verbalized understanding          OT Short Term Goals - 03/24/17 1637      OT SHORT TERM  GOAL #1   Title Pt will understand and be independent with the HEP provided to facilitate his progress with ROM and strength.   Time 4   Period Weeks   Status On-going   Target Date 04/23/17     OT SHORT TERM GOAL #2   Title Pt will have strength of 3+/5 to assist in his right arm for ADL tasks such as lightweight household activities.   Time 4   Period Weeks   Status On-going     OT SHORT TERM GOAL #3   Title Patuients P/ROM of right arm will be within normal limits to increase his ability to complete overhead reaching activities.   Time 4   Period Weeks   Status On-going     OT SHORT TERM GOAL #4   Title Patient will have decreased pain in his right shoulder to 2/10 or better with activity.    Time 4   Period Weeks   Status On-going     OT SHORT TERM GOAL #5   Title Patient will decrease fascial restrictions to min- moderate in his right shoulder region for greater mobility needed for ADL completion.    Time 6   Period Weeks   Status On-going           OT Long Term Goals - 03/24/17 1637      OT LONG TERM GOAL #1   Title Pt will return to prior level of independence with all daily and work activities using his right arm as dominant.    Time 8   Period Weeks   Status On-going   Target Date 05/23/17     OT LONG TERM GOAL #2   Title Pt will have decreased fascial restrictions to zero in his RUE to relieve tightness and tenderness to facilitate better ROM.   Time 8   Period Weeks   Status On-going     OT LONG TERM GOAL #3   Title Pt will have 2/10 or less for pain when using RUE while completing daily activities.    Time 8   Period Weeks   Status On-going     OT LONG TERM GOAL #4   Title Pt will have strength of 4+/5 to assist his right arm for ADL tasks such as yard work and heavy household chores.   Time 8   Period Weeks   Status On-going     OT LONG TERM GOAL #5   Title Patients A/ROM of right arm will be within normal limits to increase his ability to  complete all overhead reaching  activities.   Time 8   Period Weeks   Status On-going               Plan - 03/26/17 1159    Clinical Impression Statement A: Patient with full P/ROM supine and was able to complete supine AA/ROM with increased reps and full range. Although once standing, patient had increased difficulty raising arm up to shoulder height when holding wand. Trapezius muscle activation noted during shoulder flexion movements. Patient was given AA/ROM exercises to complete for HEP. VC for form and technique.   Plan P: To decrease trapezius activation complete Prone horizontal abduction with ER at end range. Prone extension. Sidelying flexion and ER.      Patient will benefit from skilled therapeutic intervention in order to improve the following deficits and impairments:  Decreased range of motion, Increased fascial restricitons, Decreased scar mobility, Impaired UE functional use, Decreased strength, Pain  Visit Diagnosis: Other symptoms and signs involving the musculoskeletal system  Acute pain of right shoulder  Stiffness of right shoulder, not elsewhere classified    Problem List Patient Active Problem List   Diagnosis Date Noted  . Special screening for malignant neoplasms, colon     Ailene Ravel, OTR/L,CBIS  [phone]  03/26/2017, 12:03 PM  [facility] [address] [place], [place], [address] Phone: [phone]   Fax:  [phone]  Name: Jacob Arellano MRN: [id number] Date of Birth: Sep 14, 1955

## 2017-03-26 NOTE — Patient Instructions (Signed)
Perform each exercise ___15_____ reps. 2-3x days.   Protraction - STANDING  Start by holding a wand or cane at chest height.  Next, slowly push the wand outwards in front of your body so that your elbows become fully straightened. Then, return to the original position.     Shoulder FLEXION - STANDING - PALMS UP  In the standing position, hold a wand/cane with both arms, palms up on both sides. Raise up the wand/cane allowing your unaffected arm to perform most of the effort. Your affected arm should be partially relaxed.      Internal/External ROTATION - STANDING  In the standing position, hold a wand/cane with both hands keeping your elbows bent. Move your arms and wand/cane to one side.  Your affected arm should be partially relaxed while your unaffected arm performs most of the effort.       Shoulder ABDUCTION - STANDING  While holding a wand/cane palm face up on the injured side and palm face down on the uninjured side, slowly raise up your injured arm to the side.            Horizontal Abduction/Adduction      Straight arms holding cane at shoulder height, bring cane to right, center, left. Repeat starting to left.   Copyright  V[employer]. All rights reserved.

## 2017-04-02 ENCOUNTER — Ambulatory Visit (HOSPITAL_COMMUNITY): Payer: 59

## 2017-04-02 ENCOUNTER — Encounter (HOSPITAL_COMMUNITY): Payer: Self-pay

## 2017-04-02 DIAGNOSIS — M25611 Stiffness of right shoulder, not elsewhere classified: Secondary | ICD-10-CM

## 2017-04-02 DIAGNOSIS — R29898 Other symptoms and signs involving the musculoskeletal system: Secondary | ICD-10-CM | POA: Diagnosis not present

## 2017-04-02 DIAGNOSIS — M25511 Pain in right shoulder: Secondary | ICD-10-CM

## 2017-04-02 NOTE — Therapy (Signed)
[facility] [address] [place], [place], [address] Phone: [phone]   Fax:  [phone]  Occupational Therapy Treatment  Patient Details  Name: Jacob Arellano MRN: [id number] Date of Birth: 08/17/1956 Referring Provider: Dr. Judye Bos  Encounter Date: 04/02/2017      OT End of Session - 04/02/17 1724    Visit Number 3   Number of Visits 16   Date for OT Re-Evaluation 05/23/17  mini reasess on 04/22/17   Authorization Type U[employer]    Authorization Time Period 100% covered   OT Start Time 1435   OT Stop Time 1514   OT Time Calculation (min) 39 min   Activity Tolerance Patient tolerated treatment well   Behavior During Therapy [facility] for tasks assessed/performed      Past Medical History:  Diagnosis Date  . Depression   . Hypercholesteremia   . Hypertension   . Sleep apnea     Past Surgical History:  Procedure Laterality Date  . BACK SURGERY    . broken nose    . CATARACT EXTRACTION W/PHACO Right 11/01/2012   Procedure: CATARACT EXTRACTION PHACO AND INTRAOCULAR LENS PLACEMENT (IOC);  Surgeon: Tonny Branch, MD;  Location: [facility];  Service: Ophthalmology;  Laterality: Right;  CDE=7.50  . CATARACT EXTRACTION W/PHACO Left 11/15/2012   Procedure: CATARACT EXTRACTION PHACO AND INTRAOCULAR LENS PLACEMENT (IOC);  Surgeon: Tonny Branch, MD;  Location: [facility];  Service: Ophthalmology;  Laterality: Left;  CDE 6.68  . COLONOSCOPY N/A 10/31/2016   Procedure: COLONOSCOPY;  Surgeon: Danie Binder, MD;  Location: [facility];  Service: Endoscopy;  Laterality: N/A;  11:45 pm  . GANGLION CYST EXCISION     Bilateral hands  . left bicep repair    . NASAL SEPTUM SURGERY    . ROTATOR CUFF REPAIR     right  . TONSILLECTOMY    . VITRECTOMY      There were no vitals filed for this visit.      Subjective Assessment - 04/02/17 1720    Subjective  A: I feel great lying down but it gets hard to do exercises when I stand up.   Currently in Pain?  No/denies            [facility] OT Assessment - 04/02/17 1720      Assessment   Diagnosis R Shoulder RTC Tear Arthroscopr     Precautions   Precautions Shoulder   Type of Shoulder Precautions progress as tolerated                  OT Treatments/Exercises (OP) - 04/02/17 1720      Exercises   Exercises Shoulder     Shoulder Exercises: Supine   Protraction PROM;5 reps;AAROM;15 reps   Horizontal ABduction PROM;5 reps;AAROM;15 reps   External Rotation PROM;5 reps;AAROM;15 reps   Internal Rotation PROM;5 reps;AAROM;15 reps   Flexion PROM;5 reps;AAROM;15 reps   ABduction PROM;5 reps;AAROM;15 reps     Shoulder Exercises: Prone   Extension AROM;10 reps   External Rotation AROM;10 reps   Internal Rotation AROM;10 reps   Horizontal ABduction 1 AROM;10 reps     Shoulder Exercises: Sidelying   External Rotation AROM;10 reps   Internal Rotation AROM;10 reps   Flexion AROM;10 reps     Shoulder Exercises: Standing   Protraction AAROM;10 reps   Horizontal ABduction AAROM;10 reps   External Rotation AAROM;10 reps   Internal Rotation AAROM;10 reps   Flexion AAROM;10 reps   ABduction  AAROM;10 reps     Shoulder Exercises: Pulleys   Flexion 1 minute   ABduction 1 minute     Shoulder Exercises: Therapy Ball   Right/Left 10 reps     Shoulder Exercises: ROM/Strengthening   Wall Wash 1'   Proximal Shoulder Strengthening, Supine 15X no rest breaks                OT Education - 04/02/17 1720    Education provided No          OT Short Term Goals - 03/24/17 1637      OT SHORT TERM GOAL #1   Title Pt will understand and be independent with the HEP provided to facilitate his progress with ROM and strength.   Time 4   Period Weeks   Status On-going   Target Date 04/23/17     OT SHORT TERM GOAL #2   Title Pt will have strength of 3+/5 to assist in his right arm for ADL tasks such as lightweight household activities.   Time 4   Period Weeks   Status  On-going     OT SHORT TERM GOAL #3   Title Patuients P/ROM of right arm will be within normal limits to increase his ability to complete overhead reaching activities.   Time 4   Period Weeks   Status On-going     OT SHORT TERM GOAL #4   Title Patient will have decreased pain in his right shoulder to 2/10 or better with activity.    Time 4   Period Weeks   Status On-going     OT SHORT TERM GOAL #5   Title Patient will decrease fascial restrictions to min- moderate in his right shoulder region for greater mobility needed for ADL completion.    Time 6   Period Weeks   Status On-going           OT Long Term Goals - 03/24/17 1637      OT LONG TERM GOAL #1   Title Pt will return to prior level of independence with all daily and work activities using his right arm as dominant.    Time 8   Period Weeks   Status On-going   Target Date 05/23/17     OT LONG TERM GOAL #2   Title Pt will have decreased fascial restrictions to zero in his RUE to relieve tightness and tenderness to facilitate better ROM.   Time 8   Period Weeks   Status On-going     OT LONG TERM GOAL #3   Title Pt will have 2/10 or less for pain when using RUE while completing daily activities.    Time 8   Period Weeks   Status On-going     OT LONG TERM GOAL #4   Title Pt will have strength of 4+/5 to assist his right arm for ADL tasks such as yard work and heavy household chores.   Time 8   Period Weeks   Status On-going     OT LONG TERM GOAL #5   Title Patients A/ROM of right arm will be within normal limits to increase his ability to complete all overhead reaching activities.   Time 8   Period Weeks   Status On-going               Plan - 04/02/17 1730    Clinical Impression Statement A: Pt shows full ROM in supine exercises however when standing pt is limited by pain for  shoulder ROM. Completed AA/ROM exercises with intermittent rest breaks needed while standing due to pain. Added prone and  sidelying shoulder exercises with VC needed for form and technique.    Plan P: Continue with prone and sidelying A/ROM. Complete shoulder AA/ROM standing to increase ROM. Progress to standing A/ROM when pt is able to tolerate. Attempt supine shoulder A/ROM.      Patient will benefit from skilled therapeutic intervention in order to improve the following deficits and impairments:  Decreased range of motion, Increased fascial restricitons, Decreased scar mobility, Impaired UE functional use, Decreased strength, Pain  Visit Diagnosis: Other symptoms and signs involving the musculoskeletal system  Acute pain of right shoulder  Stiffness of right shoulder, not elsewhere classified    Problem List Patient Active Problem List   Diagnosis Date Noted  . Special screening for malignant neoplasms, colon     Luther Hearing, OT Student [phone] 04/02/2017, 5:33 PM  [facility] [address] [place], [place], [address] Phone: [phone]   Fax:  [phone]  Name: Jacob Arellano MRN: [id number] Date of Birth: 1955-09-26   Note reviewed by clinical instructor and accurately reflects treatment session.   Ailene Ravel, OTR/L,CBIS  [phone]

## 2017-04-03 ENCOUNTER — Other Ambulatory Visit (HOSPITAL_COMMUNITY): Payer: Self-pay | Admitting: Internal Medicine

## 2017-04-03 ENCOUNTER — Ambulatory Visit (HOSPITAL_COMMUNITY): Payer: 59 | Admitting: Occupational Therapy

## 2017-04-03 DIAGNOSIS — H40053 Ocular hypertension, bilateral: Secondary | ICD-10-CM

## 2017-04-08 ENCOUNTER — Ambulatory Visit (HOSPITAL_COMMUNITY): Payer: 59

## 2017-04-08 ENCOUNTER — Encounter (HOSPITAL_COMMUNITY): Payer: Self-pay

## 2017-04-08 DIAGNOSIS — R29898 Other symptoms and signs involving the musculoskeletal system: Secondary | ICD-10-CM | POA: Diagnosis not present

## 2017-04-08 DIAGNOSIS — M25511 Pain in right shoulder: Secondary | ICD-10-CM

## 2017-04-08 DIAGNOSIS — M25611 Stiffness of right shoulder, not elsewhere classified: Secondary | ICD-10-CM

## 2017-04-08 NOTE — Therapy (Signed)
[facility] [address] [place], [place], [address] Phone: [phone]   Fax:  [phone]  Occupational Therapy Treatment  Patient Details  Name: Jacob Arellano MRN: [id number] Date of Birth: 01-24-1956 Referring Provider: Dr. Judye Bos  Encounter Date: 04/08/2017      OT End of Session - 04/08/17 1030    Visit Number 4   Number of Visits 16   Date for OT Re-Evaluation 05/23/17  mini reasess on 04/22/17   Authorization Type UHC    Authorization Time Period 100% covered   OT Start Time 0910   OT Stop Time 0945   OT Time Calculation (min) 35 min   Activity Tolerance Patient tolerated treatment well   Behavior During Therapy [facility] for tasks assessed/performed      Past Medical History:  Diagnosis Date  . Depression   . Hypercholesteremia   . Hypertension   . Sleep apnea     Past Surgical History:  Procedure Laterality Date  . BACK SURGERY    . broken nose    . CATARACT EXTRACTION W/PHACO Right 11/01/2012   Procedure: CATARACT EXTRACTION PHACO AND INTRAOCULAR LENS PLACEMENT (IOC);  Surgeon: Tonny Branch, MD;  Location: [facility];  Service: Ophthalmology;  Laterality: Right;  CDE=7.50  . CATARACT EXTRACTION W/PHACO Left 11/15/2012   Procedure: CATARACT EXTRACTION PHACO AND INTRAOCULAR LENS PLACEMENT (IOC);  Surgeon: Tonny Branch, MD;  Location: [facility];  Service: Ophthalmology;  Laterality: Left;  CDE 6.68  . COLONOSCOPY N/A 10/31/2016   Procedure: COLONOSCOPY;  Surgeon: Danie Binder, MD;  [facility];  Service: Endoscopy;  Laterality: N/A;  11:45 pm  . GANGLION CYST EXCISION     Bilateral hands  . left bicep repair    . NASAL SEPTUM SURGERY    . ROTATOR CUFF REPAIR     right  . TONSILLECTOMY    . VITRECTOMY      There were no vitals filed for this visit.      Subjective Assessment - 04/08/17 0921    Subjective  A: Reaching into an overhead shelf is something I have trouble doing.   Currently in Pain? No/denies             [facility] OT Assessment - 04/08/17 0921      Assessment   Diagnosis R Shoulder RTC Tear Arthroscopr     Precautions   Precautions Shoulder   Type of Shoulder Precautions progress as tolerated                  OT Treatments/Exercises (OP) - 04/08/17 0922      Exercises   Exercises Shoulder     Shoulder Exercises: Supine   Protraction PROM;5 reps;AROM;12 reps   Horizontal ABduction PROM;5 reps;AROM;12 reps   External Rotation PROM;5 reps;AROM;12 reps   Internal Rotation PROM;5 reps;AROM;12 reps   Flexion PROM;5 reps;AROM;12 reps   ABduction PROM;5 reps     Shoulder Exercises: Sidelying   External Rotation AROM;12 reps   Internal Rotation AROM;12 reps   Flexion AROM;12 reps   ABduction AROM;12 reps   Other Sidelying Exercises Protraction; 12X; A/ROM     Shoulder Exercises: Standing   Protraction AAROM;12 reps   Horizontal ABduction AAROM;12 reps  low level   External Rotation AROM;10 reps   Internal Rotation AROM;12 reps   Flexion AAROM;12 reps   ABduction AAROM;12 reps   Extension Theraband;10 reps   Theraband Level (Shoulder Extension) Level 2 (Red)   Row Theraband;10 reps  Theraband Level (Shoulder Row) Level 2 (Red)   Retraction Theraband;10 reps   Theraband Level (Shoulder Retraction) Level 2 (Red)     Manual Therapy   Manual Therapy Myofascial release   Manual therapy comments manual therapy completed seperately from all other interventions this date                   OT Short Term Goals - 03/24/17 1637      OT SHORT TERM GOAL #1   Title Pt will understand and be independent with the HEP provided to facilitate his progress with ROM and strength.   Time 4   Period Weeks   Status On-going   Target Date 04/23/17     OT SHORT TERM GOAL #2   Title Pt will have strength of 3+/5 to assist in his right arm for ADL tasks such as lightweight household activities.   Time 4   Period Weeks   Status On-going     OT SHORT TERM  GOAL #3   Title Patuients P/ROM of right arm will be within normal limits to increase his ability to complete overhead reaching activities.   Time 4   Period Weeks   Status On-going     OT SHORT TERM GOAL #4   Title Patient will have decreased pain in his right shoulder to 2/10 or better with activity.    Time 4   Period Weeks   Status On-going     OT SHORT TERM GOAL #5   Title Patient will decrease fascial restrictions to min- moderate in his right shoulder region for greater mobility needed for ADL completion.    Time 6   Period Weeks   Status On-going           OT Long Term Goals - 03/24/17 1637      OT LONG TERM GOAL #1   Title Pt will return to prior level of independence with all daily and work activities using his right arm as dominant.    Time 8   Period Weeks   Status On-going   Target Date 05/23/17     OT LONG TERM GOAL #2   Title Pt will have decreased fascial restrictions to zero in his RUE to relieve tightness and tenderness to facilitate better ROM.   Time 8   Period Weeks   Status On-going     OT LONG TERM GOAL #3   Title Pt will have 2/10 or less for pain when using RUE while completing daily activities.    Time 8   Period Weeks   Status On-going     OT LONG TERM GOAL #4   Title Pt will have strength of 4+/5 to assist his right arm for ADL tasks such as yard work and heavy household chores.   Time 8   Period Weeks   Status On-going     OT LONG TERM GOAL #5   Title Patients A/ROM of right arm will be within normal limits to increase his ability to complete all overhead reaching activities.   Time 8   Period Weeks   Status On-going               Plan - 04/08/17 1031    Clinical Impression Statement A: Pt continues to have increased difficulty with using RUE to go overhead against gravity. Unable to make it through half range. Is able to complete full range of motion during sidelying and supine exercises. VC for form and technique.  Plan P: Continue with prone exercises. Add hughston. Add wall push ups.      Patient will benefit from skilled therapeutic intervention in order to improve the following deficits and impairments:     Visit Diagnosis: Other symptoms and signs involving the musculoskeletal system  Stiffness of right shoulder, not elsewhere classified  Acute pain of right shoulder    Problem List Patient Active Problem List   Diagnosis Date Noted  . Special screening for malignant neoplasms, colon    Ailene Ravel, OTR/L,CBIS  [phone]  04/08/2017, 10:33 AM  [facility] [address] [place], [place], [address] Phone: [phone]   Fax:  [phone]  Name: EDIN KON MRN: [id number] Date of Birth: 1955/12/13

## 2017-04-09 ENCOUNTER — Ambulatory Visit (HOSPITAL_COMMUNITY)
Admission: RE | Admit: 2017-04-09 | Discharge: 2017-04-09 | Disposition: A | Discharge status: Home / Self Care | Payer: 59 | Source: Ambulatory Visit | Attending: Internal Medicine | Admitting: Internal Medicine

## 2017-04-09 DIAGNOSIS — H40059 Ocular hypertension, unspecified eye: Secondary | ICD-10-CM | POA: Diagnosis present

## 2017-04-09 DIAGNOSIS — I6521 Occlusion and stenosis of right carotid artery: Secondary | ICD-10-CM | POA: Diagnosis not present

## 2017-04-09 DIAGNOSIS — H40053 Ocular hypertension, bilateral: Secondary | ICD-10-CM

## 2017-04-10 ENCOUNTER — Ambulatory Visit (HOSPITAL_COMMUNITY): Payer: 59

## 2017-04-10 DIAGNOSIS — R29898 Other symptoms and signs involving the musculoskeletal system: Secondary | ICD-10-CM

## 2017-04-10 DIAGNOSIS — M25611 Stiffness of right shoulder, not elsewhere classified: Secondary | ICD-10-CM

## 2017-04-10 DIAGNOSIS — M25511 Pain in right shoulder: Secondary | ICD-10-CM

## 2017-04-10 NOTE — Therapy (Signed)
[facility] [address] [place], [place], [address] Phone: [phone]   Fax:  [phone]  Occupational Therapy Treatment  Patient Details  Name: Jacob Arellano MRN: [id number] Date of Birth: 28-Nov-1955 Referring Provider: Dr. Judye Bos  Encounter Date: 04/10/2017      OT End of Session - 04/10/17 1230    Visit Number 5   Number of Visits 16   Date for OT Re-Evaluation 05/23/17  mini reasess on 04/22/17   Authorization Type UHC    Authorization Time Period 100% covered   OT Start Time 1120   OT Stop Time 1200   OT Time Calculation (min) 40 min   Activity Tolerance Patient tolerated treatment well   Behavior During Therapy [facility] for tasks assessed/performed      Past Medical History:  Diagnosis Date  . Depression   . Hypercholesteremia   . Hypertension   . Sleep apnea     Past Surgical History:  Procedure Laterality Date  . BACK SURGERY    . broken nose    . CATARACT EXTRACTION W/PHACO Right 11/01/2012   Procedure: CATARACT EXTRACTION PHACO AND INTRAOCULAR LENS PLACEMENT (IOC);  Surgeon: Tonny Branch, MD;  Location: [facility];  Service: Ophthalmology;  Laterality: Right;  CDE=7.50  . CATARACT EXTRACTION W/PHACO Left 11/15/2012   Procedure: CATARACT EXTRACTION PHACO AND INTRAOCULAR LENS PLACEMENT (IOC);  Surgeon: Tonny Branch, MD;  Location: [facility];  Service: Ophthalmology;  Laterality: Left;  CDE 6.68  . COLONOSCOPY N/A 10/31/2016   Procedure: COLONOSCOPY;  Surgeon: Danie Binder, MD;  [facility]: [facility];  Service: Endoscopy;  Laterality: N/A;  11:45 pm  . GANGLION CYST EXCISION     Bilateral hands  . left bicep repair    . NASAL SEPTUM SURGERY    . ROTATOR CUFF REPAIR     right  . TONSILLECTOMY    . VITRECTOMY      There were no vitals filed for this visit.      Subjective Assessment - 04/10/17 1134    Subjective  S: No issues today.   Currently in Pain? No/denies            [facility] OT Assessment - 04/10/17  1135      Assessment   Diagnosis R Shoulder RTC Tear Arthroscopr     Precautions   Precautions Shoulder   Type of Shoulder Precautions progress as tolerated                  OT Treatments/Exercises (OP) - 04/10/17 1135      Exercises   Exercises Shoulder     Shoulder Exercises: Supine   Protraction PROM;5 reps;AROM;15 reps   Horizontal ABduction PROM;5 reps;AROM;15 reps   External Rotation PROM;5 reps;AROM;15 reps   Internal Rotation PROM;5 reps;AROM;15 reps   Flexion PROM;5 reps;AROM;15 reps   ABduction PROM;5 reps;AROM;15 reps     Shoulder Exercises: Prone   Other Prone Exercises Hughston exercises; 10X     Shoulder Exercises: Sidelying   External Rotation AROM;12 reps   Internal Rotation AROM;12 reps   Flexion AROM;12 reps   ABduction AROM;12 reps   Other Sidelying Exercises Protraction; 12X; A/ROM   Other Sidelying Exercises Horizontal abduction; 12X A/ROM     Shoulder Exercises: Standing   Extension Theraband;10 reps   Theraband Level (Shoulder Extension) Level 2 (Red)   Row Theraband;10 reps   Theraband Level (Shoulder Row) Level 2 (Red)   Retraction Theraband;10 reps   Theraband Level (Shoulder  Retraction) Level 2 (Red)     Shoulder Exercises: ROM/Strengthening   Wall Pushups 10 reps     Manual Therapy   Manual Therapy Myofascial release   Manual therapy comments manual therapy completed seperately from all other interventions this date    Myofascial Release myofascial release and manual stretching to right upper arm, scapular and shoulder region to decrease pain and improve pain free mobility.                   OT Short Term Goals - 03/24/17 1637      OT SHORT TERM GOAL #1   Title Pt will understand and be independent with the HEP provided to facilitate his progress with ROM and strength.   Time 4   Period Weeks   Status On-going   Target Date 04/23/17     OT SHORT TERM GOAL #2   Title Pt will have strength of 3+/5 to assist  in his right arm for ADL tasks such as lightweight household activities.   Time 4   Period Weeks   Status On-going     OT SHORT TERM GOAL #3   Title Patuients P/ROM of right arm will be within normal limits to increase his ability to complete overhead reaching activities.   Time 4   Period Weeks   Status On-going     OT SHORT TERM GOAL #4   Title Patient will have decreased pain in his right shoulder to 2/10 or better with activity.    Time 4   Period Weeks   Status On-going     OT SHORT TERM GOAL #5   Title Patient will decrease fascial restrictions to min- moderate in his right shoulder region for greater mobility needed for ADL completion.    Time 6   Period Weeks   Status On-going           OT Long Term Goals - 03/24/17 1637      OT LONG TERM GOAL #1   Title Pt will return to prior level of independence with all daily and work activities using his right arm as dominant.    Time 8   Period Weeks   Status On-going   Target Date 05/23/17     OT LONG TERM GOAL #2   Title Pt will have decreased fascial restrictions to zero in his RUE to relieve tightness and tenderness to facilitate better ROM.   Time 8   Period Weeks   Status On-going     OT LONG TERM GOAL #3   Title Pt will have 2/10 or less for pain when using RUE while completing daily activities.    Time 8   Period Weeks   Status On-going     OT LONG TERM GOAL #4   Title Pt will have strength of 4+/5 to assist his right arm for ADL tasks such as yard work and heavy household chores.   Time 8   Period Weeks   Status On-going     OT LONG TERM GOAL #5   Title Patients A/ROM of right arm will be within normal limits to increase his ability to complete all overhead reaching activities.   Time 8   Period Weeks   Status On-going               Plan - 04/10/17 1231    Clinical Impression Statement A: Added wall pushes and continued to focus on strengthening shoulder and scapular stability in order to  complete functional reaching tasks above shoulder level. Pt presents with continued instability and decreased weakness in shoulder although progressing towards goals. VC needed for form and technique.   Plan P: Continue to work on strengthening LUE in order to complete A/ROM exercises standing without the assistance of pvc pipe.       Patient will benefit from skilled therapeutic intervention in order to improve the following deficits and impairments:  Decreased range of motion, Increased fascial restricitons, Decreased scar mobility, Impaired UE functional use, Decreased strength, Pain  Visit Diagnosis: Other symptoms and signs involving the musculoskeletal system  Stiffness of right shoulder, not elsewhere classified  Acute pain of right shoulder    Problem List Patient Active Problem List   Diagnosis Date Noted  . Special screening for malignant neoplasms, colon    Ailene Ravel, OTR/L,CBIS  [phone]  04/10/2017, 12:33 PM  [facility] [address] [place], [place], [address] Phone: [phone]   Fax:  [phone]  Name: HAKAN NUDELMAN MRN: [id number] Date of Birth: Jul 08, 1956

## 2017-04-13 ENCOUNTER — Ambulatory Visit (HOSPITAL_COMMUNITY): Payer: 59 | Admitting: Specialist

## 2017-04-15 ENCOUNTER — Ambulatory Visit (HOSPITAL_COMMUNITY): Payer: 59

## 2017-04-15 DIAGNOSIS — R29898 Other symptoms and signs involving the musculoskeletal system: Secondary | ICD-10-CM | POA: Diagnosis not present

## 2017-04-15 DIAGNOSIS — M25511 Pain in right shoulder: Secondary | ICD-10-CM

## 2017-04-15 DIAGNOSIS — M25611 Stiffness of right shoulder, not elsewhere classified: Secondary | ICD-10-CM

## 2017-04-15 NOTE — Therapy (Signed)
[facility] [address] [place], [place], [address] Phone: [phone]   Fax:  [phone]  Occupational Therapy Treatment  Patient Details  Name: Jacob Arellano MRN: [id number] Date of Birth: [birth year] Referring Provider: Dr. Judye Bos  Encounter Date: 04/15/2017      OT End of Session - 04/15/17 1413    Visit Number 6   Number of Visits 16   Date for OT Re-Evaluation 05/23/17  mini reasess on 04/22/17   Authorization Type U[employer]    Authorization Time Period 100% covered   OT Start Time 1310  Pt arrived late   OT Stop Time 1345   OT Time Calculation (min) 35 min   Activity Tolerance Patient tolerated treatment well   Behavior During Therapy [facility] for tasks assessed/performed      Past Medical History:  Diagnosis Date  . Depression   . Hypercholesteremia   . Hypertension   . Sleep apnea     Past Surgical History:  Procedure Laterality Date  . BACK SURGERY    . broken nose    . CATARACT EXTRACTION W/PHACO Right 11/01/2012   Procedure: CATARACT EXTRACTION PHACO AND INTRAOCULAR LENS PLACEMENT (IOC);  Surgeon: Tonny Branch, MD;  Location: [facility];  Service: Ophthalmology;  Laterality: Right;  CDE=7.50  . CATARACT EXTRACTION W/PHACO Left 11/15/2012   Procedure: CATARACT EXTRACTION PHACO AND INTRAOCULAR LENS PLACEMENT (IOC);  Surgeon: Tonny Branch, MD;  Location: [facility];  Service: Ophthalmology;  Laterality: Left;  CDE 6.68  . COLONOSCOPY N/A 10/31/2016   Procedure: COLONOSCOPY;  Surgeon: Danie Binder, MD;  Location: [facility];  Service: Endoscopy;  Laterality: N/A;  11:45 pm  . GANGLION CYST EXCISION     Bilateral hands  . left bicep repair    . NASAL SEPTUM SURGERY    . ROTATOR CUFF REPAIR     right  . TONSILLECTOMY    . VITRECTOMY      There were no vitals filed for this visit.      Subjective Assessment - 04/15/17 1337    Subjective  S: i think my ROM might be better.   Currently in Pain? No/denies            [facility] OT Assessment - 04/15/17 1338      Assessment   Diagnosis R Shoulder RTC Tear Arthroscopr     Precautions   Precautions Shoulder                  OT Treatments/Exercises (OP) - 04/15/17 1338      Exercises   Exercises Shoulder     Shoulder Exercises: Supine   Protraction PROM;5 reps;AROM;15 reps   Horizontal ABduction PROM;5 reps   External Rotation PROM;5 reps   Internal Rotation PROM;5 reps   Flexion PROM;5 reps;AROM;15 reps   ABduction PROM;5 reps;AROM;15 reps     Shoulder Exercises: Sidelying   External Rotation Theraband;12 reps   Theraband Level (Shoulder External Rotation) Level 2 (Red)   Internal Rotation Theraband;12 reps   Theraband Level (Shoulder Internal Rotation) Level 2 (Red)   Flexion Theraband;12 reps   Theraband Level (Shoulder Flexion) Level 2 (Red)   ABduction Theraband;12 reps   Theraband Level (Shoulder ABduction) Level 2 (Red)   Other Sidelying Exercises Protraction; 12X; red band     Shoulder Exercises: ROM/Strengthening   UBE (Upper Arm Bike) Level 1 2' forward 2' reverse     Manual Therapy   Manual Therapy Myofascial release   Manual therapy  comments manual therapy completed seperately from all other interventions this date    Myofascial Release myofascial release and manual stretching to right upper arm, scapular and shoulder region to decrease pain and improve pain free mobility.                   OT Short Term Goals - 03/24/17 1637      OT SHORT TERM GOAL #1   Title Pt will understand and be independent with the HEP provided to facilitate his progress with ROM and strength.   Time 4   Period Weeks   Status On-going   Target Date 04/23/17     OT SHORT TERM GOAL #2   Title Pt will have strength of 3+/5 to assist in his right arm for ADL tasks such as lightweight household activities.   Time 4   Period Weeks   Status On-going     OT SHORT TERM GOAL #3   Title Patuients P/ROM of right arm will be within  normal limits to increase his ability to complete overhead reaching activities.   Time 4   Period Weeks   Status On-going     OT SHORT TERM GOAL #4   Title Patient will have decreased pain in his right shoulder to 2/10 or better with activity.    Time 4   Period Weeks   Status On-going     OT SHORT TERM GOAL #5   Title Patient will decrease fascial restrictions to min- moderate in his right shoulder region for greater mobility needed for ADL completion.    Time 6   Period Weeks   Status On-going           OT Long Term Goals - 03/24/17 1637      OT LONG TERM GOAL #1   Title Pt will return to prior level of independence with all daily and work activities using his right arm as dominant.    Time 8   Period Weeks   Status On-going   Target Date 05/23/17     OT LONG TERM GOAL #2   Title Pt will have decreased fascial restrictions to zero in his RUE to relieve tightness and tenderness to facilitate better ROM.   Time 8   Period Weeks   Status On-going     OT LONG TERM GOAL #3   Title Pt will have 2/10 or less for pain when using RUE while completing daily activities.    Time 8   Period Weeks   Status On-going     OT LONG TERM GOAL #4   Title Pt will have strength of 4+/5 to assist his right arm for ADL tasks such as yard work and heavy household chores.   Time 8   Period Weeks   Status On-going     OT LONG TERM GOAL #5   Title Patients A/ROM of right arm will be within normal limits to increase his ability to complete all overhead reaching activities.   Time 8   Period Weeks   Status On-going               Plan - 04/15/17 1413    Clinical Impression Statement A: Pt arrived late for session. Added sidelying strengthenin with theraband. Patient with increased difficulty completing strengthening through full ROM. Added UBE bike at end of session. VC for form and technique during session.    Plan P: Continue to work on strengthening LUE in order to complete  A/ROM  exercises with less assistance.       Patient will benefit from skilled therapeutic intervention in order to improve the following deficits and impairments:  Decreased range of motion, Increased fascial restricitons, Decreased scar mobility, Impaired UE functional use, Decreased strength, Pain  Visit Diagnosis: Other symptoms and signs involving the musculoskeletal system  Stiffness of right shoulder, not elsewhere classified  Acute pain of right shoulder    Problem List Patient Active Problem List   Diagnosis Date Noted  . Special screening for malignant neoplasms, colon    Ailene Ravel, OTR/L,CBIS  [phone]  04/15/2017, 2:16 PM  [facility] [address] [place], [place], [address] Phone: [phone]   Fax:  [phone]  Name: ARBY DAHIR MRN: [id number] Date of Birth: 10-Nov-1955

## 2017-04-17 ENCOUNTER — Encounter (HOSPITAL_COMMUNITY): Payer: 59 | Admitting: Occupational Therapy

## 2017-04-22 ENCOUNTER — Encounter (HOSPITAL_COMMUNITY): Payer: Self-pay

## 2017-04-22 ENCOUNTER — Ambulatory Visit (HOSPITAL_COMMUNITY): Payer: 59

## 2017-04-22 DIAGNOSIS — R29898 Other symptoms and signs involving the musculoskeletal system: Secondary | ICD-10-CM | POA: Diagnosis not present

## 2017-04-22 DIAGNOSIS — M25611 Stiffness of right shoulder, not elsewhere classified: Secondary | ICD-10-CM

## 2017-04-22 DIAGNOSIS — M25511 Pain in right shoulder: Secondary | ICD-10-CM

## 2017-04-22 NOTE — Patient Instructions (Signed)
(  Home) Extension: Isometric / Bilateral Arm Retraction - Sitting   Facing anchor, hold hands and elbow at shoulder height, with elbow bent.  Pull arms back to squeeze shoulder blades together. Repeat 10-15 times. 1-3 times/day.   Copyright  VHI. All rights reserved.   (Home) Retraction: Row - Bilateral (Anchor)   Facing anchor, arms reaching forward, pull hands toward stomach, keeping elbows bent and at your sides and pinching shoulder blades together. Repeat 10-15 times. 1-3 times/day.   Copyright  VHI. All rights reserved.   (Clinic) Extension / Flexion (Assist)   Face anchor, pull arms back, keeping elbow straight, and squeze shoulder blades together. Repeat 10-15 times. 1-3 times/day.   Copyright  VHI. All rights reserved.  

## 2017-04-22 NOTE — Therapy (Signed)
[facility] [address] [place], [place], [address] Phone: [phone]   Fax:  [phone]  Occupational Therapy Treatment  Patient Details  Name: [name] MRN: [id number] Date of Birth: 08-08-1956 Referring Provider: Dr. Judye Bos  Encounter Date: 04/22/2017      OT End of Session - 04/22/17 1456    Visit Number 7   Number of Visits 16   Date for OT Re-Evaluation 05/23/17  mini reasess on 04/22/17   Authorization Type UHC    Authorization Time Period 100% covered   OT Start Time 1309  Pt arrived late   OT Stop Time 1346   OT Time Calculation (min) 37 min   Activity Tolerance Patient tolerated treatment well   Behavior During Therapy [facility] for tasks assessed/performed      Past Medical History:  Diagnosis Date  . Depression   . Hypercholesteremia   . Hypertension   . Sleep apnea     Past Surgical History:  Procedure Laterality Date  . BACK SURGERY    . broken nose    . CATARACT EXTRACTION W/PHACO Right 11/01/2012   Procedure: CATARACT EXTRACTION PHACO AND INTRAOCULAR LENS PLACEMENT (IOC);  Surgeon: Tonny Branch, MD;  Location: [facility];  Service: Ophthalmology;  Laterality: Right;  CDE=7.50  . CATARACT EXTRACTION W/PHACO Left 11/15/2012   Procedure: CATARACT EXTRACTION PHACO AND INTRAOCULAR LENS PLACEMENT (IOC);  Surgeon: Tonny Branch, MD;  Location: [facility];  Service: Ophthalmology;  Laterality: Left;  CDE 6.68  . COLONOSCOPY N/A 10/31/2016   Procedure: COLONOSCOPY;  Surgeon: Danie Binder, MD;  [facility];  Service: Endoscopy;  Laterality: N/A;  11:45 pm  . GANGLION CYST EXCISION     Bilateral hands  . left bicep repair    . NASAL SEPTUM SURGERY    . ROTATOR CUFF REPAIR     right  . TONSILLECTOMY    . VITRECTOMY      There were no vitals filed for this visit.      Subjective Assessment - 04/22/17 1332    Subjective  S: No complaints. No pain today.   Currently in Pain? No/denies                       OT Treatments/Exercises (OP) - 04/22/17 1325      Exercises   Exercises Shoulder     Shoulder Exercises: Supine   Protraction PROM;5 reps   Horizontal ABduction PROM;5 reps   External Rotation PROM;5 reps   Internal Rotation PROM;5 reps   Flexion PROM;5 reps   ABduction PROM;5 reps     Shoulder Exercises: Sidelying   External Rotation AROM;12 reps   Internal Rotation AROM;12 reps   Flexion Theraband;12 reps   Theraband Level (Shoulder Flexion) Level 2 (Red)   ABduction Theraband;12 reps   Theraband Level (Shoulder ABduction) Level 2 (Red)   Other Sidelying Exercises Protraction; 12X; red band   Other Sidelying Exercises Horizontal abduction; 12X red band     Shoulder Exercises: Standing   Protraction AROM;10 reps   External Rotation AROM;10 reps   Internal Rotation AROM;10 reps   Flexion AROM;10 reps   Extension Theraband;12 reps   Theraband Level (Shoulder Extension) Level 3 (Green)   Row [employer] reps   Theraband Level (Shoulder Row) Level 3 (Green)   Retraction Theraband;12 reps   Theraband Level (Shoulder Retraction) Level 3 (Green)     Shoulder Exercises: ROM/Strengthening   Over Head Lace 2'  Functional Reaching Activities   Mid Level Functional reaching task using cones to place on middle shelf  with RUE and then return to countertop.                 OT Education - 04/22/17 1456    Education provided Yes   Education Details scapular strengthening with green band   Person(s) Educated Patient   Methods Explanation;Demonstration;Verbal cues;Handout   Comprehension Returned demonstration;Verbalized understanding          OT Short Term Goals - 03/24/17 1637      OT SHORT TERM GOAL #1   Title Pt will understand and be independent with the HEP provided to facilitate his progress with ROM and strength.   Time 4   Period Weeks   Status On-going   Target Date 04/23/17     OT SHORT TERM GOAL #2   Title Pt  will have strength of 3+/5 to assist in his right arm for ADL tasks such as lightweight household activities.   Time 4   Period Weeks   Status On-going     OT SHORT TERM GOAL #3   Title Patuients P/ROM of right arm will be within normal limits to increase his ability to complete overhead reaching activities.   Time 4   Period Weeks   Status On-going     OT SHORT TERM GOAL #4   Title Patient will have decreased pain in his right shoulder to 2/10 or better with activity.    Time 4   Period Weeks   Status On-going     OT SHORT TERM GOAL #5   Title Patient will decrease fascial restrictions to min- moderate in his right shoulder region for greater mobility needed for ADL completion.    Time 6   Period Weeks   Status On-going           OT Long Term Goals - 03/24/17 1637      OT LONG TERM GOAL #1   Title Pt will return to prior level of independence with all daily and work activities using his right arm as dominant.    Time 8   Period Weeks   Status On-going   Target Date 05/23/17     OT LONG TERM GOAL #2   Title Pt will have decreased fascial restrictions to zero in his RUE to relieve tightness and tenderness to facilitate better ROM.   Time 8   Period Weeks   Status On-going     OT LONG TERM GOAL #3   Title Pt will have 2/10 or less for pain when using RUE while completing daily activities.    Time 8   Period Weeks   Status On-going     OT LONG TERM GOAL #4   Title Pt will have strength of 4+/5 to assist his right arm for ADL tasks such as yard work and heavy household chores.   Time 8   Period Weeks   Status On-going     OT LONG TERM GOAL #5   Title Patients A/ROM of right arm will be within normal limits to increase his ability to complete all overhead reaching activities.   Time 8   Period Weeks   Status On-going               Plan - 04/22/17 1456    Clinical Impression Statement A: More difficulty with theraband exercises sidelying this session.  Patient did progress to standing A/ROM with less than full  ROM. Range was modified to what patient could achieve. VC for form and technique.    Plan P: Continue to work on strengthening and functional reaching tasks with RUE.       Patient will benefit from skilled therapeutic intervention in order to improve the following deficits and impairments:  Decreased range of motion, Increased fascial restricitons, Decreased scar mobility, Impaired UE functional use, Decreased strength, Pain  Visit Diagnosis: Other symptoms and signs involving the musculoskeletal system  Stiffness of right shoulder, not elsewhere classified  Acute pain of right shoulder    Problem List Patient Active Problem List   Diagnosis Date Noted  . Special screening for malignant neoplasms, colon    Ailene Ravel, OTR/L,CBIS  [phone]  04/22/2017, 3:00 PM  [facility] [address] [place], [place], [address] Phone: [phone]   Fax:  [phone]  Name: Jacob Arellano MRN: [id number] Date of Birth: 03-26-1956

## 2017-04-24 ENCOUNTER — Ambulatory Visit (HOSPITAL_COMMUNITY): Payer: 59

## 2017-04-24 DIAGNOSIS — R29898 Other symptoms and signs involving the musculoskeletal system: Secondary | ICD-10-CM | POA: Diagnosis not present

## 2017-04-24 DIAGNOSIS — M25611 Stiffness of right shoulder, not elsewhere classified: Secondary | ICD-10-CM

## 2017-04-24 DIAGNOSIS — M25511 Pain in right shoulder: Secondary | ICD-10-CM

## 2017-04-24 NOTE — Therapy (Signed)
[facility] [place], [place], [address] Phone: [phone]   Fax:  [phone]  Occupational Therapy Treatment And mini reassessment Patient Details  Name: Jacob Arellano MRN: [id number] Date of Birth: [birth year] Referring Provider: Dr. Judye Bos  Encounter Date: 04/24/2017      OT End of Session - 04/24/17 1320    Visit Number 8   Number of Visits 16   Date for OT Re-Evaluation 05/23/17   Authorization Type UHC    Authorization Time Period 100% covered   OT Start Time 1120   OT Stop Time 1200   OT Time Calculation (min) 40 min   Activity Tolerance Patient tolerated treatment well   Behavior During Therapy [facility] for tasks assessed/performed      Past Medical History:  Diagnosis Date  . Depression   . Hypercholesteremia   . Hypertension   . Sleep apnea     Past Surgical History:  Procedure Laterality Date  . BACK SURGERY    . broken nose    . CATARACT EXTRACTION W/PHACO Right 11/01/2012   Procedure: CATARACT EXTRACTION PHACO AND INTRAOCULAR LENS PLACEMENT (IOC);  Surgeon: Tonny Branch, MD;  Location: [facility];  Service: Ophthalmology;  Laterality: Right;  CDE=7.50  . CATARACT EXTRACTION W/PHACO Left 11/15/2012   Procedure: CATARACT EXTRACTION PHACO AND INTRAOCULAR LENS PLACEMENT (IOC);  Surgeon: Tonny Branch, MD;  Location: [facility];  Service: Ophthalmology;  Laterality: Left;  CDE 6.68  . COLONOSCOPY N/A 10/31/2016   Procedure: COLONOSCOPY;  Surgeon: Danie Binder, MD;  [facility];  Service: Endoscopy;  Laterality: N/A;  11:45 pm  . GANGLION CYST EXCISION     Bilateral hands  . left bicep repair    . NASAL SEPTUM SURGERY    . ROTATOR CUFF REPAIR     right  . TONSILLECTOMY    . VITRECTOMY      There were no vitals filed for this visit.      Subjective Assessment - 04/24/17 1138    Subjective  S: It's just a little sore. Not bad.   Currently in Pain? Yes   Pain Score 2    Pain Location Shoulder    Pain Orientation Right   Pain Descriptors / Indicators Sore   Pain Type Acute pain   Pain Onset In the past 7 days            [facility] OT Assessment - 04/24/17 1121      Assessment   Diagnosis R Shoulder RTC Tear Arthroscopr   Onset Date 12/30/16     Precautions   Precautions Shoulder   Type of Shoulder Precautions progress as tolerated     Prior Function   Level of Independence Independent     ROM / Strength   AROM / PROM / Strength AROM;PROM;Strength     AROM   Overall AROM Comments assessed in supine then seated. pt was assessed supine on eval, external and internal rotaiton with shouldere adducted    AROM Assessment Site Shoulder   Right/Left Shoulder Right   Right Shoulder Flexion 180 Degrees  previous: 45 (supine) 70: seated (8/31)   Right Shoulder ABduction 75 Degrees  previous: 44 (supine) seated: 50 (8/31)   Right Shoulder Internal Rotation 90 Degrees  previous: 80 (supine) seated: 90 (8/31)   Right Shoulder External Rotation 90 Degrees  previous: 80 (supine) seated: 70 (8/31)     PROM   Overall PROM Comments assessed in supine, external and internal  rotation with shoulder adducted   PROM Assessment Site Shoulder   Right/Left Shoulder Right   Right Shoulder Flexion 180 Degrees  previous: 130   Right Shoulder ABduction 180 Degrees  previous: 110   Right Shoulder Internal Rotation 90 Degrees  previous: 85   Right Shoulder External Rotation 90 Degrees  previous: 80     Strength   Overall Strength Comments Assessed seated. IR/er adducted. Strength not assessed prior to this session.    Strength Assessment Site Shoulder   Right/Left Shoulder Right   Right Shoulder Flexion 3-/5   Right Shoulder ABduction 3-/5   Right Shoulder Internal Rotation 3/5   Right Shoulder External Rotation 3/5                  OT Treatments/Exercises (OP) - 04/24/17 1134      Exercises   Exercises Shoulder     Shoulder Exercises: Supine   Protraction PROM;5  reps   Horizontal ABduction PROM;5 reps;AROM;15 reps   External Rotation PROM;5 reps   Internal Rotation PROM;5 reps   Flexion PROM;5 reps   ABduction PROM;5 reps;AROM;15 reps     Shoulder Exercises: Standing   Protraction AROM;10 reps   Horizontal ABduction AAROM;12 reps   External Rotation AROM;10 reps;Theraband;15 reps   Theraband Level (Shoulder External Rotation) Level 3 (Green)   Internal Rotation AROM;10 reps   Flexion AROM;10 reps   ABduction AAROM;12 reps   Extension Theraband;15 reps   Theraband Level (Shoulder Extension) Level 3 (Green)   Row [employer] reps   Theraband Level (Shoulder Row) Level 3 (Green)   Retraction Theraband;15 reps   Theraband Level (Shoulder Retraction) Level 3 (Green)     Manual Therapy   Manual Therapy Myofascial release   Manual therapy comments manual therapy completed seperately from all other interventions this date    Myofascial Release myofascial release and manual stretching to right upper arm, scapular and shoulder region to decrease pain and improve pain free mobility.                   OT Short Term Goals - 04/24/17 1141      OT SHORT TERM GOAL #1   Title Pt will understand and be independent with the HEP provided to facilitate his progress with ROM and strength.   Time 4   Period Weeks   Status On-going     OT SHORT TERM GOAL #2   Title Pt will have strength of 3+/5 to assist in his right arm for ADL tasks such as lightweight household activities.   Time 4   Period Weeks   Status Partially Met     OT SHORT TERM GOAL #3   Title Pt's  P/ROM of right arm will be within normal limits to increase his ability to complete overhead reaching activities.   Time 4   Period Weeks   Status Achieved     OT SHORT TERM GOAL #4   Title Patient will have decreased pain in his right shoulder to 2/10 or better with activity.    Time 4   Period Weeks   Status Achieved     OT SHORT TERM GOAL #5   Title Patient will decrease  fascial restrictions to min- moderate in his right shoulder region for greater mobility needed for ADL completion.    Time 6   Period Weeks   Status Achieved           OT Long Term Goals - 04/24/17 1145  OT LONG TERM GOAL #1   Title Pt will return to prior level of independence with all daily and work activities using his right arm as dominant.    Time 8   Period Weeks   Status On-going     OT LONG TERM GOAL #2   Title Pt will have decreased fascial restrictions to zero in his RUE to relieve tightness and tenderness to facilitate better ROM.   Time 8   Period Weeks   Status On-going     OT LONG TERM GOAL #3   Title Pt will have 2/10 or less for pain when using RUE while completing daily activities.    Time 8   Period Weeks   Status On-going     OT LONG TERM GOAL #4   Title Pt will have strength of 4+/5 to assist his right arm for ADL tasks such as yard work and heavy household chores.   Time 8   Period Weeks   Status On-going     OT LONG TERM GOAL #5   Title Patients A/ROM of right arm will be within normal limits to increase his ability to complete all overhead reaching activities.   Time 8   Period Weeks   Status On-going               Plan - 04/24/17 1320    Clinical Impression Statement A: Mini reassessment completed this date. patient has met 3/5 STGs. Patient has made progress with ROM and strength although continues to have deficits in both areas. Patient typically has full A/ROM supine although once standing he is unable to complete full range abduction, flexion, and horizontal abduction. Pt reports that he does not complete his HEP on a regular basis. Education was provided on the importance for success in therapy. Pt verbalized understanding. Reviewed HEP and made recommendations for exercises. VC for form and technique.    Plan P: Continue to work on strengthening and functional reaching tasks with RUE. Focus gaining full ROM against gravity.        Patient will benefit from skilled therapeutic intervention in order to improve the following deficits and impairments:  Decreased range of motion, Increased fascial restricitons, Decreased scar mobility, Impaired UE functional use, Decreased strength, Pain  Visit Diagnosis: Other symptoms and signs involving the musculoskeletal system  Stiffness of right shoulder, not elsewhere classified  Acute pain of right shoulder    Problem List Patient Active Problem List   Diagnosis Date Noted  . Special screening for malignant neoplasms, colon    Ailene Ravel, OTR/L,CBIS  [phone]  04/24/2017, 1:33 PM  [facility] [address] [place], [place], [address] Phone: [phone]   Fax:  [phone]  Name: RIGO LETTS MRN: [id number] Date of Birth: June 25, 1956

## 2017-04-24 NOTE — Patient Instructions (Signed)

## 2017-04-29 ENCOUNTER — Encounter (HOSPITAL_COMMUNITY): Payer: Self-pay | Admitting: Occupational Therapy

## 2017-04-29 ENCOUNTER — Ambulatory Visit (HOSPITAL_COMMUNITY): Payer: 59 | Attending: Orthopaedic Surgery | Admitting: Occupational Therapy

## 2017-04-29 DIAGNOSIS — M25611 Stiffness of right shoulder, not elsewhere classified: Secondary | ICD-10-CM

## 2017-04-29 DIAGNOSIS — R29898 Other symptoms and signs involving the musculoskeletal system: Secondary | ICD-10-CM | POA: Diagnosis present

## 2017-04-29 DIAGNOSIS — M25511 Pain in right shoulder: Secondary | ICD-10-CM

## 2017-04-29 NOTE — Therapy (Signed)
[place], [place], [address] Phone: [phone]   Fax:  [phone]  Occupational Therapy Treatment  Patient Details  Name: Jacob Arellano MRN: [id number] Date of Birth: 1956/06/17 Referring Provider: Dr. Judye Bos  Encounter Date: 04/29/2017      OT End of Session - 04/29/17 1344    Visit Number 9   Number of Visits 16   Date for OT Re-Evaluation 05/23/17   Authorization Type U[employer]    Authorization Time Period 100% covered   OT Start Time 1302   OT Stop Time 1343   OT Time Calculation (min) 41 min   Activity Tolerance Patient tolerated treatment well   Behavior During Therapy [facility] for tasks assessed/performed      Past Medical History:  Diagnosis Date  . Depression   . Hypercholesteremia   . Hypertension   . Sleep apnea     Past Surgical History:  Procedure Laterality Date  . BACK SURGERY    . broken nose    . CATARACT EXTRACTION W/PHACO Right 11/01/2012   Procedure: CATARACT EXTRACTION PHACO AND INTRAOCULAR LENS PLACEMENT (IOC);  Surgeon: Tonny Branch, MD;  Location: [facility];  Service: Ophthalmology;  Laterality: Right;  CDE=7.50  . CATARACT EXTRACTION W/PHACO Left 11/15/2012   Procedure: CATARACT EXTRACTION PHACO AND INTRAOCULAR LENS PLACEMENT (IOC);  Surgeon: Tonny Branch, MD;  Location: [facility];  Service: Ophthalmology;  Laterality: Left;  CDE 6.68  . COLONOSCOPY N/A 10/31/2016   Procedure: COLONOSCOPY;  Surgeon: Danie Binder, MD;  Location: [facility];  Service: Endoscopy;  Laterality: N/A;  11:45 pm  . GANGLION CYST EXCISION     Bilateral hands  . left bicep repair    . NASAL SEPTUM SURGERY    . ROTATOR CUFF REPAIR     right  . TONSILLECTOMY    . VITRECTOMY      There were no vitals filed for this visit.      Subjective Assessment - 04/29/17 1300    Subjective  S: I've been trying to do my exercises more.    Currently in Pain? No/denies            [facility] OT Assessment - 04/29/17  1300      Assessment   Diagnosis R Shoulder RTC Tear Arthroscopr     Precautions   Precautions Shoulder   Type of Shoulder Precautions progress as tolerated                  OT Treatments/Exercises (OP) - 04/29/17 1300      Exercises   Exercises Shoulder     Shoulder Exercises: Supine   Protraction PROM;5 reps;AROM;15 reps   Horizontal ABduction PROM;5 reps;AROM;15 reps   Flexion PROM;5 reps;AROM;15 reps   ABduction PROM;5 reps;AROM;15 reps     Shoulder Exercises: Sidelying   External Rotation AROM;15 reps   Internal Rotation AROM;15 reps   Flexion AROM;15 reps   ABduction AROM;15 reps   Other Sidelying Exercises Protraction; 15X   Other Sidelying Exercises Horizontal abduction; 15X      Shoulder Exercises: Standing   Protraction AROM;10 reps   Horizontal ABduction AAROM;10 reps   External Rotation AROM;10 reps;Theraband;15 reps   Theraband Level (Shoulder External Rotation) Level 2 (Red)   Internal Rotation AROM;10 reps;Theraband;15 reps   Theraband Level (Shoulder Internal Rotation) Level 3 (Green)   Flexion AROM;AAROM;10 reps   Flexion Limitations Pt using RUE strength to 50% range, using dowel rod for remaining range  ABduction AAROM;10 reps   Extension Theraband;15 reps   Theraband Level (Shoulder Extension) Level 3 (Green)   Row Theraband;15 reps   Theraband Level (Shoulder Row) Level 3 (Green)   Retraction Theraband;15 reps   Theraband Level (Shoulder Retraction) Level 3 (Green)     Manual Therapy   Manual Therapy Myofascial release   Manual therapy comments manual therapy completed seperately from all other interventions this date    Myofascial Release myofascial release and manual stretching to right upper arm, scapular and shoulder region to decrease pain and improve pain free mobility.                   OT Short Term Goals - 04/24/17 1141      OT SHORT TERM GOAL #1   Title Pt will understand and be independent with the HEP  provided to facilitate his progress with ROM and strength.   Time 4   Period Weeks   Status On-going     OT SHORT TERM GOAL #2   Title Pt will have strength of 3+/5 to assist in his right arm for ADL tasks such as lightweight household activities.   Time 4   Period Weeks   Status Partially Met     OT SHORT TERM GOAL #3   Title Pt's  P/ROM of right arm will be within normal limits to increase his ability to complete overhead reaching activities.   Time 4   Period Weeks   Status Achieved     OT SHORT TERM GOAL #4   Title Patient will have decreased pain in his right shoulder to 2/10 or better with activity.    Time 4   Period Weeks   Status Achieved     OT SHORT TERM GOAL #5   Title Patient will decrease fascial restrictions to min- moderate in his right shoulder region for greater mobility needed for ADL completion.    Time 6   Period Weeks   Status Achieved           OT Long Term Goals - 04/24/17 1145      OT LONG TERM GOAL #1   Title Pt will return to prior level of independence with all daily and work activities using his right arm as dominant.    Time 8   Period Weeks   Status On-going     OT LONG TERM GOAL #2   Title Pt will have decreased fascial restrictions to zero in his RUE to relieve tightness and tenderness to facilitate better ROM.   Time 8   Period Weeks   Status On-going     OT LONG TERM GOAL #3   Title Pt will have 2/10 or less for pain when using RUE while completing daily activities.    Time 8   Period Weeks   Status On-going     OT LONG TERM GOAL #4   Title Pt will have strength of 4+/5 to assist his right arm for ADL tasks such as yard work and heavy household chores.   Time 8   Period Weeks   Status On-going     OT LONG TERM GOAL #5   Title Patients A/ROM of right arm will be within normal limits to increase his ability to complete all overhead reaching activities.   Time 8   Period Weeks   Status On-going                Plan - 04/29/17 1345  Clinical Impression Statement A: Continued with A/ROM this session, in standing pt completed A/ROM to 50% range then used dowel rod to achieve full range. Continue with theraband, pt with difficulty with form during retraction due to weakness, also has difficulty with er. Verbal cuing for form and technique during exercises. Educated pt on importance of completing HEP, pt reports he is going to begin completing it more often.    Plan P: Continue working on strengthening, add functional reaching with pinch tree. Work on gaining full ROM against gravity.    Consulted and Agree with Plan of Care Patient      Patient will benefit from skilled therapeutic intervention in order to improve the following deficits and impairments:  Decreased range of motion, Increased fascial restricitons, Decreased scar mobility, Impaired UE functional use, Decreased strength, Pain  Visit Diagnosis: Other symptoms and signs involving the musculoskeletal system  Stiffness of right shoulder, not elsewhere classified  Acute pain of right shoulder    Problem List Patient Active Problem List   Diagnosis Date Noted  . Special screening for malignant neoplasms, colon    Guadelupe Sabin, OTR/L  [phone] 04/29/2017, 2:01 PM  [facility] [address] [place], [place], [address] Phone: [phone]   Fax:  [phone]  Name: Jacob Arellano MRN: [id number] Date of Birth: 11-May-1956

## 2017-05-01 ENCOUNTER — Ambulatory Visit (HOSPITAL_COMMUNITY): Payer: 59

## 2017-05-01 DIAGNOSIS — R29898 Other symptoms and signs involving the musculoskeletal system: Secondary | ICD-10-CM

## 2017-05-01 DIAGNOSIS — M25611 Stiffness of right shoulder, not elsewhere classified: Secondary | ICD-10-CM

## 2017-05-01 DIAGNOSIS — M25511 Pain in right shoulder: Secondary | ICD-10-CM

## 2017-05-01 NOTE — Therapy (Signed)
[place], [place], [address] Phone: [phone]   Fax:  [phone]  Occupational Therapy Treatment  Patient Details  Name: Jacob Arellano MRN: [id number] Date of Birth: 07/08/1956 Referring Provider: Dr. Judye Bos  Encounter Date: 05/01/2017      OT End of Session - 05/01/17 1142    Visit Number 10   Number of Visits 16   Date for OT Re-Evaluation 05/23/17   Authorization Type UHC    Authorization Time Period 100% covered   OT Start Time 1120   OT Stop Time 1203   OT Time Calculation (min) 43 min   Activity Tolerance Patient tolerated treatment well   Behavior During Therapy [facility] for tasks assessed/performed      Past Medical History:  Diagnosis Date  . Depression   . Hypercholesteremia   . Hypertension   . Sleep apnea     Past Surgical History:  Procedure Laterality Date  . BACK SURGERY    . broken nose    . CATARACT EXTRACTION W/PHACO Right 11/01/2012   Procedure: CATARACT EXTRACTION PHACO AND INTRAOCULAR LENS PLACEMENT (IOC);  Surgeon: Tonny Branch, MD;  Location: [facility];  Service: Ophthalmology;  Laterality: Right;  CDE=7.50  . CATARACT EXTRACTION W/PHACO Left 11/15/2012   Procedure: CATARACT EXTRACTION PHACO AND INTRAOCULAR LENS PLACEMENT (IOC);  Surgeon: Tonny Branch, MD;  Location: [facility];  Service: Ophthalmology;  Laterality: Left;  CDE 6.68  . COLONOSCOPY N/A 10/31/2016   Procedure: COLONOSCOPY;  Surgeon: Danie Binder, MD;  [facility];  Service: Endoscopy;  Laterality: N/A;  11:45 pm  . GANGLION CYST EXCISION     Bilateral hands  . left bicep repair    . NASAL SEPTUM SURGERY    . ROTATOR CUFF REPAIR     right  . TONSILLECTOMY    . VITRECTOMY      There were no vitals filed for this visit.          [facility] OT Assessment - 05/01/17 1141      Assessment   Diagnosis R Shoulder RTC Tear Arthroscopr     Precautions   Precautions Shoulder   Type of Shoulder Precautions  progress as tolerated                  OT Treatments/Exercises (OP) - 05/01/17 1141      Exercises   Exercises Shoulder     Shoulder Exercises: Supine   Protraction PROM;5 reps;AROM;15 reps   Horizontal ABduction PROM;5 reps;AROM;15 reps   Flexion PROM;5 reps;AROM;15 reps   ABduction PROM;5 reps;AROM;15 reps     Shoulder Exercises: Prone   Extension AROM;15 reps   External Rotation AROM;15 reps   Internal Rotation AROM;15 reps   Horizontal ABduction 1 AROM;15 reps     Shoulder Exercises: ROM/Strengthening   UBE (Upper Arm Bike) Level 2 2' reverse 2' forward   Pushups 15 reps   Pushups Limitations incline push ups   Proximal Shoulder Strengthening, Supine 15X no rest breaks     Functional Reaching Activities   Mid Level Completed functional reaching task using resistive clothespins while seated as he placed the on vertical pole. Patient was able to place clothespin up to 33.5 cm and then removed using right hand as well.      Manual Therapy   Manual Therapy Myofascial release   Manual therapy comments manual therapy completed seperately from all other interventions this date    Myofascial Release myofascial release  and manual stretching to right upper arm, scapular and shoulder region to decrease pain and improve pain free mobility.                   OT Short Term Goals - 04/24/17 1141      OT SHORT TERM GOAL #1   Title Pt will understand and be independent with the HEP provided to facilitate his progress with ROM and strength.   Time 4   Period Weeks   Status On-going     OT SHORT TERM GOAL #2   Title Pt will have strength of 3+/5 to assist in his right arm for ADL tasks such as lightweight household activities.   Time 4   Period Weeks   Status Partially Met     OT SHORT TERM GOAL #3   Title Pt's  P/ROM of right arm will be within normal limits to increase his ability to complete overhead reaching activities.   Time 4   Period Weeks   Status  Achieved     OT SHORT TERM GOAL #4   Title Patient will have decreased pain in his right shoulder to 2/10 or better with activity.    Time 4   Period Weeks   Status Achieved     OT SHORT TERM GOAL #5   Title Patient will decrease fascial restrictions to min- moderate in his right shoulder region for greater mobility needed for ADL completion.    Time 6   Period Weeks   Status Achieved           OT Long Term Goals - 04/24/17 1145      OT LONG TERM GOAL #1   Title Pt will return to prior level of independence with all daily and work activities using his right arm as dominant.    Time 8   Period Weeks   Status On-going     OT LONG TERM GOAL #2   Title Pt will have decreased fascial restrictions to zero in his RUE to relieve tightness and tenderness to facilitate better ROM.   Time 8   Period Weeks   Status On-going     OT LONG TERM GOAL #3   Title Pt will have 2/10 or less for pain when using RUE while completing daily activities.    Time 8   Period Weeks   Status On-going     OT LONG TERM GOAL #4   Title Pt will have strength of 4+/5 to assist his right arm for ADL tasks such as yard work and heavy household chores.   Time 8   Period Weeks   Status On-going     OT LONG TERM GOAL #5   Title Patients A/ROM of right arm will be within normal limits to increase his ability to complete all overhead reaching activities.   Time 8   Period Weeks   Status On-going               Plan - 05/01/17 1203    Clinical Impression Statement A: Pt with a lot of crunching and grinding sensation in right shoulder during session. VC for form and technique. Added functional  reaching task this session able to reach just below shoulder level while seated.    Plan P: Continue to work on strengthening and functional reaching working in gaining full ROM against gravity if able.      Patient will benefit from skilled therapeutic intervention in order to improve the following  deficits and impairments:  Decreased range of motion, Increased fascial restricitons, Decreased scar mobility, Impaired UE functional use, Decreased strength, Pain  Visit Diagnosis: Other symptoms and signs involving the musculoskeletal system  Stiffness of right shoulder, not elsewhere classified  Acute pain of right shoulder    Problem List Patient Active Problem List   Diagnosis Date Noted  . Special screening for malignant neoplasms, colon    Ailene Ravel, OTR/L,CBIS  [phone]  05/01/2017, 12:16 PM  [facility] [address] [place], [place], [address] Phone: [phone]   Fax:  [phone]  Name: GLEEN RIPBERGER MRN: [id number] Date of Birth: 1956/02/28

## 2017-05-05 ENCOUNTER — Ambulatory Visit (HOSPITAL_COMMUNITY): Payer: 59

## 2017-05-05 DIAGNOSIS — R29898 Other symptoms and signs involving the musculoskeletal system: Secondary | ICD-10-CM | POA: Diagnosis not present

## 2017-05-05 DIAGNOSIS — M25611 Stiffness of right shoulder, not elsewhere classified: Secondary | ICD-10-CM

## 2017-05-05 DIAGNOSIS — M25511 Pain in right shoulder: Secondary | ICD-10-CM

## 2017-05-05 NOTE — Therapy (Signed)
[place], [place], [address] Phone: [phone]   Fax:  [phone]  Occupational Therapy Treatment  Patient Details  Name: Jacob Arellano MRN: [id number] Date of Birth: [birth year] Referring Provider: Dr. Judye Bos  Encounter Date: 05/05/2017      OT End of Session - 05/05/17 1731    Visit Number 11   Number of Visits 16   Date for OT Re-Evaluation 05/23/17   Authorization Type UHC    Authorization Time Period 100% covered   OT Start Time 1439   OT Stop Time 1517   OT Time Calculation (min) 38 min   Activity Tolerance Patient tolerated treatment well   Behavior During Therapy [facility] for tasks assessed/performed      Past Medical History:  Diagnosis Date  . Depression   . Hypercholesteremia   . Hypertension   . Sleep apnea     Past Surgical History:  Procedure Laterality Date  . BACK SURGERY    . broken nose    . CATARACT EXTRACTION W/PHACO Right 11/01/2012   Procedure: CATARACT EXTRACTION PHACO AND INTRAOCULAR LENS PLACEMENT (IOC);  Surgeon: Tonny Branch, MD;  Location: [facility];  Service: Ophthalmology;  Laterality: Right;  CDE=7.50  . CATARACT EXTRACTION W/PHACO Left 11/15/2012   Procedure: CATARACT EXTRACTION PHACO AND INTRAOCULAR LENS PLACEMENT (IOC);  Surgeon: Tonny Branch, MD;  Location: [facility];  Service: Ophthalmology;  Laterality: Left;  CDE 6.68  . COLONOSCOPY N/A 10/31/2016   Procedure: COLONOSCOPY;  Surgeon: Danie Binder, MD;  Location[facility];  Service: Endoscopy;  Laterality: N/A;  11:45 pm  . GANGLION CYST EXCISION     Bilateral hands  . left bicep repair    . NASAL SEPTUM SURGERY    . ROTATOR CUFF REPAIR     right  . TONSILLECTOMY    . VITRECTOMY      There were no vitals filed for this visit.      Subjective Assessment - 05/05/17 1723    Subjective  S: No issues to report.   Currently in Pain? No/denies                      OT Treatments/Exercises (OP) -  05/05/17 1502      Exercises   Exercises Shoulder     Shoulder Exercises: Supine   Protraction PROM;5 reps   Horizontal ABduction PROM;5 reps   External Rotation PROM;15 reps   Internal Rotation AROM;15 reps   Flexion AROM;15 reps   ABduction AROM;15 reps     Shoulder Exercises: Standing   Protraction AROM;10 reps   Horizontal ABduction AAROM;10 reps   External Rotation AROM;10 reps   Internal Rotation AROM;10 reps   Flexion AROM;10 reps   ABduction AAROM;10 reps   Extension Theraband;15 reps   Theraband Level (Shoulder Extension) Level 3 (Green)   Row [employer] reps   Theraband Level (Shoulder Row) Level 3 (Green)   Retraction Theraband;15 reps   Theraband Level (Shoulder Retraction) Level 3 (Green)     Shoulder Exercises: ROM/Strengthening   Over Head Lace 2'   Pushups 15 reps   Pushups Limitations incline push ups   Proximal Shoulder Strengthening, Seated 15X with rest break after each position     Manual Therapy   Manual Therapy Myofascial release   Manual therapy comments manual therapy completed seperately from all other interventions this date    Myofascial Release myofascial release and manual stretching to right upper arm,  scapular and shoulder region to decrease pain and improve pain free mobility.                   OT Short Term Goals - 04/24/17 1141      OT SHORT TERM GOAL #1   Title Pt will understand and be independent with the HEP provided to facilitate his progress with ROM and strength.   Time 4   Period Weeks   Status On-going     OT SHORT TERM GOAL #2   Title Pt will have strength of 3+/5 to assist in his right arm for ADL tasks such as lightweight household activities.   Time 4   Period Weeks   Status Partially Met     OT SHORT TERM GOAL #3   Title Pt's  P/ROM of right arm will be within normal limits to increase his ability to complete overhead reaching activities.   Time 4   Period Weeks   Status Achieved     OT SHORT TERM  GOAL #4   Title Patient will have decreased pain in his right shoulder to 2/10 or better with activity.    Time 4   Period Weeks   Status Achieved     OT SHORT TERM GOAL #5   Title Patient will decrease fascial restrictions to min- moderate in his right shoulder region for greater mobility needed for ADL completion.    Time 6   Period Weeks   Status Achieved           OT Long Term Goals - 04/24/17 1145      OT LONG TERM GOAL #1   Title Pt will return to prior level of independence with all daily and work activities using his right arm as dominant.    Time 8   Period Weeks   Status On-going     OT LONG TERM GOAL #2   Title Pt will have decreased fascial restrictions to zero in his RUE to relieve tightness and tenderness to facilitate better ROM.   Time 8   Period Weeks   Status On-going     OT LONG TERM GOAL #3   Title Pt will have 2/10 or less for pain when using RUE while completing daily activities.    Time 8   Period Weeks   Status On-going     OT LONG TERM GOAL #4   Title Pt will have strength of 4+/5 to assist his right arm for ADL tasks such as yard work and heavy household chores.   Time 8   Period Weeks   Status On-going     OT LONG TERM GOAL #5   Title Patients A/ROM of right arm will be within normal limits to increase his ability to complete all overhead reaching activities.   Time 8   Period Weeks   Status On-going               Plan - 05/05/17 1732    Clinical Impression Statement A: No crunching or grinding sensation felt this session per patient. VC for form and technique. Continues to have difficulty with achieving A/ROM above shoulder level.   Plan P: Continue to work on strengthening and functional reaching working on gaining full ROM against gravity if able.       Patient will benefit from skilled therapeutic intervention in order to improve the following deficits and impairments:  Decreased range of motion, Increased fascial  restricitons, Decreased scar mobility, Impaired UE functional  use, Decreased strength, Pain  Visit Diagnosis: Other symptoms and signs involving the musculoskeletal system  Stiffness of right shoulder, not elsewhere classified  Acute pain of right shoulder    Problem List Patient Active Problem List   Diagnosis Date Noted  . Special screening for malignant neoplasms, colon    Ailene Ravel, OTR/L,CBIS  [phone]  05/05/2017, 5:40 PM  [facility] [address] [place], [place], [address] Phone: [phone]   Fax:  [phone]  Name: Jacob Arellano MRN: [id number] Date of Birth: [birth year]

## 2017-05-06 ENCOUNTER — Telehealth (HOSPITAL_COMMUNITY): Payer: Self-pay | Admitting: Internal Medicine

## 2017-05-06 NOTE — Telephone Encounter (Signed)
05/06/17  pt cx because of conflicts and we rescheduled for both

## 2017-05-07 ENCOUNTER — Encounter (HOSPITAL_COMMUNITY): Payer: 59 | Admitting: Occupational Therapy

## 2017-05-08 ENCOUNTER — Encounter (HOSPITAL_COMMUNITY): Payer: Self-pay

## 2017-05-08 ENCOUNTER — Ambulatory Visit (HOSPITAL_COMMUNITY): Payer: 59

## 2017-05-08 DIAGNOSIS — M25511 Pain in right shoulder: Secondary | ICD-10-CM

## 2017-05-08 DIAGNOSIS — R29898 Other symptoms and signs involving the musculoskeletal system: Secondary | ICD-10-CM | POA: Diagnosis not present

## 2017-05-08 DIAGNOSIS — M25611 Stiffness of right shoulder, not elsewhere classified: Secondary | ICD-10-CM

## 2017-05-08 NOTE — Therapy (Signed)
[place], [place], [address] Phone: [phone]   Fax:  [phone]  Occupational Therapy Treatment  Patient Details  Name: Jacob Arellano MRN: [id number] Date of Birth: 01/29/1956 Referring Provider: Dr. Judye Bos  Encounter Date: 05/08/2017      OT End of Session - 05/08/17 1347    Visit Number 12   Number of Visits 16   Date for OT Re-Evaluation 05/23/17   Authorization Type UHC    Authorization Time Period 100% covered   OT Start Time 1308  Pt arrived late   OT Stop Time 1345   OT Time Calculation (min) 37 min   Activity Tolerance Patient tolerated treatment well   Behavior During Therapy [facility] for tasks assessed/performed      Past Medical History:  Diagnosis Date  . Depression   . Hypercholesteremia   . Hypertension   . Sleep apnea     Past Surgical History:  Procedure Laterality Date  . BACK SURGERY    . broken nose    . CATARACT EXTRACTION W/PHACO Right 11/01/2012   Procedure: CATARACT EXTRACTION PHACO AND INTRAOCULAR LENS PLACEMENT (IOC);  Surgeon: Tonny Branch, MD;  Location: [facility];  Service: Ophthalmology;  Laterality: Right;  CDE=7.50  . CATARACT EXTRACTION W/PHACO Left 11/15/2012   Procedure: CATARACT EXTRACTION PHACO AND INTRAOCULAR LENS PLACEMENT (IOC);  Surgeon: Tonny Branch, MD;  Location: [facility];  Service: Ophthalmology;  Laterality: Left;  CDE 6.68  . COLONOSCOPY N/A 10/31/2016   Procedure: COLONOSCOPY;  Surgeon: Danie Binder, MD;  Location: [facility];  Service: Endoscopy;  Laterality: N/A;  11:45 pm  . GANGLION CYST EXCISION     Bilateral hands  . left bicep repair    . NASAL SEPTUM SURGERY    . ROTATOR CUFF REPAIR     right  . TONSILLECTOMY    . VITRECTOMY      There were no vitals filed for this visit.      Subjective Assessment - 05/08/17 1321    Subjective  S: I can tell it's better.   Currently in Pain? No/denies            [facility] OT Assessment - 05/08/17  1322      Assessment   Diagnosis R Shoulder RTC Tear Arthroscopr     Precautions   Precautions Shoulder   Type of Shoulder Precautions progress as tolerated                  OT Treatments/Exercises (OP) - 05/08/17 1322      Exercises   Exercises Shoulder     Shoulder Exercises: Supine   Protraction PROM;5 reps;Strengthening;15 reps   Protraction Weight (lbs) 1   Horizontal ABduction PROM;5 reps;Strengthening;15 reps   Horizontal ABduction Weight (lbs) 1   External Rotation PROM;5 reps;Strengthening;15 reps   External Rotation Weight (lbs) 1   Internal Rotation PROM;5 reps;Strengthening;15 reps   Internal Rotation Weight (lbs) 1   Flexion PROM;5 reps;Strengthening;15 reps   Shoulder Flexion Weight (lbs) 1   ABduction PROM;5 reps;Strengthening;15 reps   Shoulder ABduction Weight (lbs) 1     Shoulder Exercises: Sidelying   External Rotation Strengthening;15 reps   External Rotation Weight (lbs) 1   Internal Rotation Strengthening;15 reps   Internal Rotation Weight (lbs) 1   Flexion Strengthening;15 reps   Flexion Weight (lbs) 1   ABduction Strengthening;15 reps   ABduction Weight (lbs) 1   Other Sidelying Exercises Protraction; 15X; 13  Other Sidelying Exercises Horizontal abduction; 15X; 1#     Shoulder Exercises: Standing   Protraction Strengthening;10 reps   Protraction Weight (lbs) 1   Horizontal ABduction AAROM;12 reps   External Rotation Strengthening;10 reps   External Rotation Weight (lbs) 1   Internal Rotation Strengthening;10 reps   Internal Rotation Weight (lbs) 1   Flexion AROM;10 reps   ABduction AAROM;12 reps     Shoulder Exercises: ROM/Strengthening   UBE (Upper Arm Bike) Level 3 2' reverse 2' forward   Over Head Lace 2'   Pushups 20 reps   Pushups Limitations incline push ups   Proximal Shoulder Strengthening, Supine 15X; 1#; no rest breaks   Proximal Shoulder Strengthening, Seated 15X no rest breaks     Manual Therapy   Manual  Therapy Myofascial release   Manual therapy comments manual therapy completed seperately from all other interventions this date    Myofascial Release myofascial release and manual stretching to right upper arm, scapular and shoulder region to decrease pain and improve pain free mobility.                   OT Short Term Goals - 05/08/17 1349      OT SHORT TERM GOAL #1   Title Pt will understand and be independent with the HEP provided to facilitate his progress with ROM and strength.   Time 4   Period Weeks   Status On-going     OT SHORT TERM GOAL #2   Title Pt will have strength of 3+/5 to assist in his right arm for ADL tasks such as lightweight household activities.   Time 4   Period Weeks   Status Partially Met     OT SHORT TERM GOAL #3   Title Pt's  P/ROM of right arm will be within normal limits to increase his ability to complete overhead reaching activities.   Time 4   Period Weeks     OT SHORT TERM GOAL #4   Title Patient will have decreased pain in his right shoulder to 2/10 or better with activity.    Time 4   Period Weeks     OT SHORT TERM GOAL #5   Title Patient will decrease fascial restrictions to min- moderate in his right shoulder region for greater mobility needed for ADL completion.    Time 6   Period Weeks           OT Long Term Goals - 04/24/17 1145      OT LONG TERM GOAL #1   Title Pt will return to prior level of independence with all daily and work activities using his right arm as dominant.    Time 8   Period Weeks   Status On-going     OT LONG TERM GOAL #2   Title Pt will have decreased fascial restrictions to zero in his RUE to relieve tightness and tenderness to facilitate better ROM.   Time 8   Period Weeks   Status On-going     OT LONG TERM GOAL #3   Title Pt will have 2/10 or less for pain when using RUE while completing daily activities.    Time 8   Period Weeks   Status On-going     OT LONG TERM GOAL #4   Title Pt  will have strength of 4+/5 to assist his right arm for ADL tasks such as yard work and heavy household chores.   Time 8   Period Weeks  Status On-going     OT LONG TERM GOAL #5   Title Patients A/ROM of right arm will be within normal limits to increase his ability to complete all overhead reaching activities.   Time 8   Period Weeks   Status On-going               Plan - 05/08/17 1347    Clinical Impression Statement A: Patient was able to progress to strengthening supine this session as well as complete some standing exercises with 1# weight. Increase push up repetitions and resistance on UBE bike. Patient required minimal VC for form and technique.   Plan P: Continue to work on strengthening and being able to progress to completing all standing exercises with weight.       Patient will benefit from skilled therapeutic intervention in order to improve the following deficits and impairments:  Decreased range of motion, Increased fascial restricitons, Decreased scar mobility, Impaired UE functional use, Decreased strength, Pain  Visit Diagnosis: Other symptoms and signs involving the musculoskeletal system  Stiffness of right shoulder, not elsewhere classified  Acute pain of right shoulder    Problem List Patient Active Problem List   Diagnosis Date Noted  . Special screening for malignant neoplasms, colon    Ailene Ravel, OTR/L,CBIS  [phone]  05/08/2017, 1:50 PM  [place] [address] [place], [place], [address] Phone: [phone]   Fax:  [phone]  Name: Jacob Arellano MRN: [id number] Date of Birth: 08-15-1956

## 2017-05-13 ENCOUNTER — Encounter (HOSPITAL_COMMUNITY): Payer: Self-pay | Admitting: Occupational Therapy

## 2017-05-13 ENCOUNTER — Ambulatory Visit (HOSPITAL_COMMUNITY): Payer: 59 | Admitting: Occupational Therapy

## 2017-05-13 DIAGNOSIS — M25511 Pain in right shoulder: Secondary | ICD-10-CM

## 2017-05-13 DIAGNOSIS — M25611 Stiffness of right shoulder, not elsewhere classified: Secondary | ICD-10-CM

## 2017-05-13 DIAGNOSIS — R29898 Other symptoms and signs involving the musculoskeletal system: Secondary | ICD-10-CM | POA: Diagnosis not present

## 2017-05-13 NOTE — Therapy (Signed)
[place], [place], [address] Phone: [phone]   Fax:  [phone]  Occupational Therapy Treatment  Patient Details  Name: Jacob Arellano MRN: [id number] Date of Birth: Jun 25, 1956 Referring Provider: Dr. Judye Bos  Encounter Date: 05/13/2017      OT End of Session - 05/13/17 1607    Visit Number 13   Number of Visits 16   Date for OT Re-Evaluation 05/23/17   Authorization Type UHC    Authorization Time Period 100% covered   OT Start Time 1440  pt arrived late   OT Stop Time 1515   OT Time Calculation (min) 35 min   Activity Tolerance Patient tolerated treatment well   Behavior During Therapy [facility] for tasks assessed/performed      Past Medical History:  Diagnosis Date  . Depression   . Hypercholesteremia   . Hypertension   . Sleep apnea     Past Surgical History:  Procedure Laterality Date  . BACK SURGERY    . broken nose    . CATARACT EXTRACTION W/PHACO Right 11/01/2012   Procedure: CATARACT EXTRACTION PHACO AND INTRAOCULAR LENS PLACEMENT (IOC);  Surgeon: Tonny Branch, MD;  Location: [facility];  Service: Ophthalmology;  Laterality: Right;  CDE=7.50  . CATARACT EXTRACTION W/PHACO Left 11/15/2012   Procedure: CATARACT EXTRACTION PHACO AND INTRAOCULAR LENS PLACEMENT (IOC);  Surgeon: Tonny Branch, MD;  Location: [facility];  Service: Ophthalmology;  Laterality: Left;  CDE 6.68  . COLONOSCOPY N/A 10/31/2016   Procedure: COLONOSCOPY;  Surgeon: Danie Binder, MD;  Location: [facility];  Service: Endoscopy;  Laterality: N/A;  11:45 pm  . GANGLION CYST EXCISION     Bilateral hands  . left bicep repair    . NASAL SEPTUM SURGERY    . ROTATOR CUFF REPAIR     right  . TONSILLECTOMY    . VITRECTOMY      There were no vitals filed for this visit.      Subjective Assessment - 05/13/17 1440    Subjective  S: I just can't get it to go any further. (past 50% range with flexion)   Currently in Pain? No/denies             [facility] OT Assessment - 05/13/17 1439      Assessment   Diagnosis R Shoulder RTC Tear Arthroscopr     Precautions   Precautions Shoulder   Type of Shoulder Precautions progress as tolerated                  OT Treatments/Exercises (OP) - 05/13/17 1443      Exercises   Exercises Shoulder     Shoulder Exercises: Supine   Protraction PROM;5 reps;Strengthening;15 reps   Protraction Weight (lbs) 1   Horizontal ABduction PROM;5 reps;Strengthening;15 reps   Horizontal ABduction Weight (lbs) 1   External Rotation PROM;5 reps;Strengthening;15 reps   External Rotation Weight (lbs) 1   Internal Rotation PROM;5 reps;Strengthening;15 reps   Internal Rotation Weight (lbs) 1   Flexion PROM;5 reps;Strengthening;15 reps   Shoulder Flexion Weight (lbs) 1   ABduction PROM;5 reps;Strengthening;15 reps   Shoulder ABduction Weight (lbs) 1     Shoulder Exercises: Sidelying   External Rotation Strengthening;15 reps   External Rotation Weight (lbs) 1   Internal Rotation Strengthening;15 reps   Internal Rotation Weight (lbs) 1   Flexion Strengthening;15 reps   Flexion Weight (lbs) 1   ABduction Strengthening;15 reps   ABduction Weight (lbs) 1  Other Sidelying Exercises Protraction; 15X; 13   Other Sidelying Exercises Horizontal abduction; 15X; 1#     Shoulder Exercises: Standing   Protraction Strengthening;10 reps   Protraction Weight (lbs) 1   External Rotation Strengthening;15 reps   External Rotation Weight (lbs) 1   Internal Rotation Strengthening;15 reps   Internal Rotation Weight (lbs) 1   Flexion AROM;15 reps   Flexion Limitations 50% range     Manual Therapy   Manual Therapy Myofascial release   Manual therapy comments manual therapy completed seperately from all other interventions this date    Myofascial Release myofascial release and manual stretching to right upper arm, scapular and shoulder region to decrease pain and improve pain free mobility.                    OT Short Term Goals - 05/08/17 1349      OT SHORT TERM GOAL #1   Title Pt will understand and be independent with the HEP provided to facilitate his progress with ROM and strength.   Time 4   Period Weeks   Status On-going     OT SHORT TERM GOAL #2   Title Pt will have strength of 3+/5 to assist in his right arm for ADL tasks such as lightweight household activities.   Time 4   Period Weeks   Status Partially Met     OT SHORT TERM GOAL #3   Title Pt's  P/ROM of right arm will be within normal limits to increase his ability to complete overhead reaching activities.   Time 4   Period Weeks     OT SHORT TERM GOAL #4   Title Patient will have decreased pain in his right shoulder to 2/10 or better with activity.    Time 4   Period Weeks     OT SHORT TERM GOAL #5   Title Patient will decrease fascial restrictions to min- moderate in his right shoulder region for greater mobility needed for ADL completion.    Time 6   Period Weeks           OT Long Term Goals - 04/24/17 1145      OT LONG TERM GOAL #1   Title Pt will return to prior level of independence with all daily and work activities using his right arm as dominant.    Time 8   Period Weeks   Status On-going     OT LONG TERM GOAL #2   Title Pt will have decreased fascial restrictions to zero in his RUE to relieve tightness and tenderness to facilitate better ROM.   Time 8   Period Weeks   Status On-going     OT LONG TERM GOAL #3   Title Pt will have 2/10 or less for pain when using RUE while completing daily activities.    Time 8   Period Weeks   Status On-going     OT LONG TERM GOAL #4   Title Pt will have strength of 4+/5 to assist his right arm for ADL tasks such as yard work and heavy household chores.   Time 8   Period Weeks   Status On-going     OT LONG TERM GOAL #5   Title Patients A/ROM of right arm will be within normal limits to increase his ability to complete all  overhead reaching activities.   Time 8   Period Weeks   Status On-going  Plan - 05/13/17 1607    Clinical Impression Statement A: Continued with strengthening this session, pt continues to have 50% range with A/ROM flexion and requiring AA/ROM for Abduction. Some strengthening not completed due to pt arriving late. Pt requiring minimal verbal cuing for form and technique with exercises.    Plan P: Resume missed strengthening exercises, working to complete strengthening with all standing exercises   Consulted and Agree with Plan of Care Patient      Patient will benefit from skilled therapeutic intervention in order to improve the following deficits and impairments:  Decreased range of motion, Increased fascial restricitons, Decreased scar mobility, Impaired UE functional use, Decreased strength, Pain  Visit Diagnosis: Stiffness of right shoulder, not elsewhere classified  Other symptoms and signs involving the musculoskeletal system  Acute pain of right shoulder    Problem List Patient Active Problem List   Diagnosis Date Noted  . Special screening for malignant neoplasms, colon    Guadelupe Sabin, OTR/L  [phone] 05/13/2017, 4:10 PM  [place] [address] [place], [place], [address] Phone: [phone]   Fax:  [phone]  Name: Jacob Arellano MRN: [id number] Date of Birth: 01-17-1956

## 2017-05-15 ENCOUNTER — Encounter (HOSPITAL_COMMUNITY): Payer: Self-pay | Admitting: Occupational Therapy

## 2017-05-15 ENCOUNTER — Ambulatory Visit (HOSPITAL_COMMUNITY): Payer: 59 | Admitting: Occupational Therapy

## 2017-05-15 DIAGNOSIS — M25511 Pain in right shoulder: Secondary | ICD-10-CM

## 2017-05-15 DIAGNOSIS — R29898 Other symptoms and signs involving the musculoskeletal system: Secondary | ICD-10-CM | POA: Diagnosis not present

## 2017-05-15 DIAGNOSIS — M25611 Stiffness of right shoulder, not elsewhere classified: Secondary | ICD-10-CM

## 2017-05-15 NOTE — Therapy (Signed)
[place], [place], [address] Phone: [phone]   Fax:  [phone]  Occupational Therapy Treatment  Patient Details  Name: Jacob Arellano MRN: [id number] Date of Birth: 04/04/1956 Referring Provider: Dr. Judye Bos  Encounter Date: 05/15/2017      OT End of Session - 05/15/17 1524    Visit Number 14   Number of Visits 16   Date for OT Re-Evaluation 05/23/17   Authorization Type UHC    Authorization Time Period 100% covered   OT Start Time 1435   OT Stop Time 1519   OT Time Calculation (min) 44 min   Activity Tolerance Patient tolerated treatment well   Behavior During Therapy [facility] for tasks assessed/performed      Past Medical History:  Diagnosis Date  . Depression   . Hypercholesteremia   . Hypertension   . Sleep apnea     Past Surgical History:  Procedure Laterality Date  . BACK SURGERY    . broken nose    . CATARACT EXTRACTION W/PHACO Right 11/01/2012   Procedure: CATARACT EXTRACTION PHACO AND INTRAOCULAR LENS PLACEMENT (IOC);  Surgeon: Tonny Branch, MD;  Location: [facility];  Service: Ophthalmology;  Laterality: Right;  CDE=7.50  . CATARACT EXTRACTION W/PHACO Left 11/15/2012   Procedure: CATARACT EXTRACTION PHACO AND INTRAOCULAR LENS PLACEMENT (IOC);  Surgeon: Tonny Branch, MD;  Location: [facility];  Service: Ophthalmology;  Laterality: Left;  CDE 6.68  . COLONOSCOPY N/A 10/31/2016   Procedure: COLONOSCOPY;  Surgeon: Danie Binder, MD;  Location[facility];  Service: Endoscopy;  Laterality: N/A;  11:45 pm  . GANGLION CYST EXCISION     Bilateral hands  . left bicep repair    . NASAL SEPTUM SURGERY    . ROTATOR CUFF REPAIR     right  . TONSILLECTOMY    . VITRECTOMY      There were no vitals filed for this visit.      Subjective Assessment - 05/15/17 1436    Subjective  S: I'm not doing as good today as I did last week.    Currently in Pain? No/denies            [facility] OT Assessment -  05/15/17 1436      Assessment   Diagnosis R Shoulder RTC Tear Arthroscopr     Precautions   Precautions Shoulder   Type of Shoulder Precautions progress as tolerated                  OT Treatments/Exercises (OP) - 05/15/17 1440      Exercises   Exercises Shoulder     Shoulder Exercises: Supine   Protraction PROM;5 reps;Strengthening;15 reps   Protraction Weight (lbs) 1   Horizontal ABduction PROM;5 reps;Strengthening;15 reps   Horizontal ABduction Weight (lbs) 1   External Rotation PROM;5 reps;Strengthening;15 reps  shoulders abducted   External Rotation Weight (lbs) 1   Internal Rotation PROM;5 reps;Strengthening;15 reps  shoulders abducted   Internal Rotation Weight (lbs) 1   Flexion PROM;5 reps;Strengthening;15 reps   Shoulder Flexion Weight (lbs) 1   ABduction PROM;5 reps;Strengthening;15 reps   Shoulder ABduction Weight (lbs) 1     Shoulder Exercises: Prone   Retraction Strengthening;15 reps   Retraction Weight (lbs) 1   Flexion Strengthening;15 reps   Flexion Weight (lbs) 1   Extension Strengthening;15 reps   Extension Weight (lbs) 1   Horizontal ABduction 1 Strengthening;15 reps   Horizontal ABduction 1 Weight (lbs) 1  Shoulder Exercises: ROM/Strengthening   Pushups 20 reps   Pushups Limitations incline push ups   Proximal Shoulder Strengthening, Supine 15X; 1#; no rest breaks   Proximal Shoulder Strengthening, Seated 15X 2 rest breaks   Ball on Wall 1' flexion 1' abduction     Manual Therapy   Manual Therapy Myofascial release   Manual therapy comments manual therapy completed seperately from all other interventions this date    Myofascial Release myofascial release and manual stretching to right upper arm, scapular and shoulder region to decrease pain and improve pain free mobility.                   OT Short Term Goals - 05/08/17 1349      OT SHORT TERM GOAL #1   Title Pt will understand and be independent with the HEP  provided to facilitate his progress with ROM and strength.   Time 4   Period Weeks   Status On-going     OT SHORT TERM GOAL #2   Title Pt will have strength of 3+/5 to assist in his right arm for ADL tasks such as lightweight household activities.   Time 4   Period Weeks   Status Partially Met     OT SHORT TERM GOAL #3   Title Pt's  P/ROM of right arm will be within normal limits to increase his ability to complete overhead reaching activities.   Time 4   Period Weeks     OT SHORT TERM GOAL #4   Title Patient will have decreased pain in his right shoulder to 2/10 or better with activity.    Time 4   Period Weeks     OT SHORT TERM GOAL #5   Title Patient will decrease fascial restrictions to min- moderate in his right shoulder region for greater mobility needed for ADL completion.    Time 6   Period Weeks           OT Long Term Goals - 04/24/17 1145      OT LONG TERM GOAL #1   Title Pt will return to prior level of independence with all daily and work activities using his right arm as dominant.    Time 8   Period Weeks   Status On-going     OT LONG TERM GOAL #2   Title Pt will have decreased fascial restrictions to zero in his RUE to relieve tightness and tenderness to facilitate better ROM.   Time 8   Period Weeks   Status On-going     OT LONG TERM GOAL #3   Title Pt will have 2/10 or less for pain when using RUE while completing daily activities.    Time 8   Period Weeks   Status On-going     OT LONG TERM GOAL #4   Title Pt will have strength of 4+/5 to assist his right arm for ADL tasks such as yard work and heavy household chores.   Time 8   Period Weeks   Status On-going     OT LONG TERM GOAL #5   Title Patients A/ROM of right arm will be within normal limits to increase his ability to complete all overhead reaching activities.   Time 8   Period Weeks   Status On-going               Plan - 05/15/17 1524    Clinical Impression Statement A:  Continued with strengthening, added prone strengthening and ball  on wall. Attempted to have pt complete x to v arms, however pt unable to complete due to weakness. Pt with increased difficulty with supine strengthening in flexion, OT providing 1 finger assist for first 3 repetitions. Verbal cuing for form and technique.    Plan P: Continue with strengthening, working to add weight to all standing exercises-try heavier dowel rod 2# or 3#   OT Home Exercise Plan 03/23/17: towel slides   Consulted and Agree with Plan of Care Patient      Patient will benefit from skilled therapeutic intervention in order to improve the following deficits and impairments:  Decreased range of motion, Increased fascial restricitons, Decreased scar mobility, Impaired UE functional use, Decreased strength, Pain  Visit Diagnosis: Stiffness of right shoulder, not elsewhere classified  Other symptoms and signs involving the musculoskeletal system  Acute pain of right shoulder    Problem List Patient Active Problem List   Diagnosis Date Noted  . Special screening for malignant neoplasms, colon    Guadelupe Sabin, OTR/L  [phone] 05/15/2017, 3:26 PM  [place] [address] [place], [place], [address] Phone: [phone]   Fax:  [phone]  Name: Jacob Arellano MRN: [id number] Date of Birth: 06-21-1956

## 2017-05-19 ENCOUNTER — Telehealth (HOSPITAL_COMMUNITY): Payer: Self-pay | Admitting: Internal Medicine

## 2017-05-19 ENCOUNTER — Ambulatory Visit (HOSPITAL_COMMUNITY): Payer: 59

## 2017-05-19 NOTE — Telephone Encounter (Signed)
05/19/17  cx due to a conflict at work

## 2017-05-20 ENCOUNTER — Encounter (HOSPITAL_COMMUNITY): Payer: 59 | Admitting: Occupational Therapy

## 2017-05-22 ENCOUNTER — Ambulatory Visit (HOSPITAL_COMMUNITY): Payer: 59

## 2017-05-22 DIAGNOSIS — M25611 Stiffness of right shoulder, not elsewhere classified: Secondary | ICD-10-CM

## 2017-05-22 DIAGNOSIS — R29898 Other symptoms and signs involving the musculoskeletal system: Secondary | ICD-10-CM

## 2017-05-22 DIAGNOSIS — M25511 Pain in right shoulder: Secondary | ICD-10-CM

## 2017-05-22 NOTE — Patient Instructions (Addendum)
1) Using washcloth on door, write the alphabet. Keep arm straight during activity. 2 sets  *Repeat all exercises 10-15 repetitions. Every day 1-2 times a day.   2) Shoulder Flexion  Standing: * Use only arm without the weight to get the full range of motion. *        Begin with arms at your side with thumbs pointed up, slowly raise both arms up and forward towards overhead.    4) Internal & External Rotation *Hold onto weight (1lb)    *No band* -Stand with elbows at the side and elbows bent 90 degrees. Move your forearms away from your body, then bring back inward toward the body.     5) Shoulder Abduction  Standing:       Lying on your back begin with your arms flat on the table next to your side. Slowly move your arms out to the side so that they go overhead, in a jumping jack or snow angel movement.      Use cane Straight arms holding cane at shoulder height, bring cane to right, center, left. Repeat starting to left.   Copyright  [employer]. All rights reserved.    (Home) Extension: Isometric / Bilateral Arm Retraction - Sitting   Facing anchor, hold hands and elbow at shoulder height, with elbow bent.  Pull arms back to squeeze shoulder blades together. Repeat 10-15 times. 1-3 times/day.   Copyright  [employer]. All rights reserved.   (Home) Retraction: Row - Bilateral (Anchor)   Facing anchor, arms reaching forward, pull hands toward stomach, keeping elbows bent and at your sides and pinching shoulder blades together. Repeat 10-15 times. 1-3 times/day.   Copyright  [employer]. All rights reserved.   (Clinic) Extension / Flexion (Assist)   Face anchor, pull arms back, keeping elbow straight, and squeze shoulder blades together. Repeat 10-15 times. 1-3 times/day.   Copyright  [employer]. All rights reserved.

## 2017-05-22 NOTE — Therapy (Signed)
[facility] [address] [place], [place], [address] Phone: [phone]   Fax:  [phone]  Occupational Therapy Treatment  Patient Details  Name: Jacob Arellano MRN: [id number] Date of Birth: [birth year] Referring Provider: Dr. Judye Bos  Encounter Date: 05/22/2017      OT End of Session - 05/22/17 1704    Visit Number 15   Number of Visits 16   Authorization Type [employer]    Authorization Time Period 100% covered   OT Start Time 1605   OT Stop Time 1645   OT Time Calculation (min) 40 min   Activity Tolerance Patient tolerated treatment well   Behavior During Therapy [facility] for tasks assessed/performed      Past Medical History:  Diagnosis Date  . Depression   . Hypercholesteremia   . Hypertension   . Sleep apnea     Past Surgical History:  Procedure Laterality Date  . BACK SURGERY    . broken nose    . CATARACT EXTRACTION W/PHACO Right 11/01/2012   Procedure: CATARACT EXTRACTION PHACO AND INTRAOCULAR LENS PLACEMENT (IOC);  Surgeon: Tonny Branch, MD;  Location: [facility];  Service: Ophthalmology;  Laterality: Right;  CDE=7.50  . CATARACT EXTRACTION W/PHACO Left 11/15/2012   Procedure: CATARACT EXTRACTION PHACO AND INTRAOCULAR LENS PLACEMENT (IOC);  Surgeon: Tonny Branch, MD;  Location: [facility];  Service: Ophthalmology;  Laterality: Left;  CDE 6.68  . COLONOSCOPY N/A 10/31/2016   Procedure: COLONOSCOPY;  Surgeon: Danie Binder, MD;  Location: [facility];  Service: Endoscopy;  Laterality: N/A;  11:45 pm  . GANGLION CYST EXCISION     Bilateral hands  . left bicep repair    . NASAL SEPTUM SURGERY    . ROTATOR CUFF REPAIR     right  . TONSILLECTOMY    . VITRECTOMY      There were no vitals filed for this visit.      Subjective Assessment - 05/22/17 1701    Subjective  S: I can tell it's getting better.    Currently in Pain? No/denies            [facility] OT Assessment - 05/22/17 1609      Assessment   Diagnosis R Shoulder  RTC Tear Arthroscopr     Precautions   Precautions Shoulder   Type of Shoulder Precautions progress as tolerated     ROM / Strength   AROM / PROM / Strength AROM;PROM;Strength     AROM   Overall AROM Comments assessed in supine then seated. pt was assessed supine on eval, external and internal rotaiton with shouldere adducted    AROM Assessment Site Shoulder   Right/Left Shoulder Right   Right Shoulder Flexion 180 Degrees  previous: same Seated: 180 degrees (previous: 70)    Right Shoulder ABduction 180 Degrees  previous: 75 Seated: 80 (previous: 50)     Right Shoulder Internal Rotation 90 Degrees  previous: same Seated: 90 (seated: same)    Right Shoulder External Rotation 90 Degrees  previous: same Seated: 90 (previous: 70)     Strength   Overall Strength Comments Assessed seated. IR/er adducted. Strength not assessed prior to this session.    Strength Assessment Site Shoulder   Right/Left Shoulder Right   Right Shoulder Flexion 4-/5  previous: 3-/5   Right Shoulder ABduction 3-/5  previous: 3-/5 (within range: 4/5)   Right Shoulder Internal Rotation 5/5  previous: 3/5   Right Shoulder External Rotation 3+/5  previous; 3/5  OT Treatments/Exercises (OP) - 05/22/17 1702      Exercises   Exercises Shoulder     Shoulder Exercises: Seated   External Rotation Strengthening;10 reps   External Rotation Weight (lbs) 1   Internal Rotation Strengthening;10 reps   Internal Rotation Weight (lbs) 1   Flexion AROM;10 reps   Abduction AROM;10 reps     Shoulder Exercises: ROM/Strengthening   Other ROM/Strengthening Exercises Alphabet on door with washcloth     Manual Therapy   Manual Therapy Myofascial release   Manual therapy comments manual therapy completed seperately from all other interventions this date    Myofascial Release myofascial release and manual stretching to right upper arm, scapular and shoulder region to decrease pain and  improve pain free mobility.                 OT Education - 05/22/17 1704    Education provided Yes   Education Details updated HEP and reviewed.    Person(s) Educated Patient   Methods Explanation;Demonstration;Verbal cues;Handout   Comprehension Returned demonstration;Verbalized understanding          OT Short Term Goals - 05/22/17 1715      OT SHORT TERM GOAL #1   Title Pt will understand and be independent with the HEP provided to facilitate his progress with ROM and strength.   Time 4   Period Weeks   Status Achieved     OT SHORT TERM GOAL #2   Title Pt will have strength of 3+/5 to assist in his right arm for ADL tasks such as lightweight household activities.   Time 4   Period Weeks   Status Achieved     OT SHORT TERM GOAL #3   Title Pt's  P/ROM of right arm will be within normal limits to increase his ability to complete overhead reaching activities.   Time 4   Period Weeks     OT SHORT TERM GOAL #4   Title Patient will have decreased pain in his right shoulder to 2/10 or better with activity.    Time 4   Period Weeks     OT SHORT TERM GOAL #5   Title Patient will decrease fascial restrictions to min- moderate in his right shoulder region for greater mobility needed for ADL completion.    Time 6   Period Weeks           OT Long Term Goals - 05/22/17 1715      OT LONG TERM GOAL #1   Title Pt will return to prior level of independence with all daily and work activities using his right arm as dominant.    Time 8   Period Weeks   Status Partially Met     OT LONG TERM GOAL #2   Title Pt will have decreased fascial restrictions to zero in his RUE to relieve tightness and tenderness to facilitate better ROM.   Time 8   Period Weeks   Status Partially Met     OT LONG TERM GOAL #3   Title Pt will have 2/10 or less for pain when using RUE while completing daily activities.    Time 8   Period Weeks   Status Achieved     OT LONG TERM GOAL #4    Title Pt will have strength of 4+/5 to assist his right arm for ADL tasks such as yard work and heavy household chores.   Time 8   Period Weeks   Status Not Met  OT LONG TERM GOAL #5   Title Patients A/ROM of right arm will be within normal limits to increase his ability to complete all overhead reaching activities.   Time 8   Period Weeks   Status Partially Met               Plan - 05/22/17 1710    Clinical Impression Statement A: Reassessment completed this date. All short term goals have been met and his long term goals have been partially met. At this time, patient has begun to plateau in therapy and it appears that he has met his maximum potential for outpatient services. patient is capable of completing HEP at home. Therpist reviewed exercises and provided patient with an updated HEP. Pt is in agreement with discharge. He is not using his right arm for 100% of daily tasks due to strength limitations although he is using it as much as he can.    Plan P: D/C from therapy with HEP.      Patient will benefit from skilled therapeutic intervention in order to improve the following deficits and impairments:  Decreased range of motion, Increased fascial restricitons, Decreased scar mobility, Impaired UE functional use, Decreased strength, Pain  Visit Diagnosis: Stiffness of right shoulder, not elsewhere classified  Other symptoms and signs involving the musculoskeletal system  Acute pain of right shoulder    Problem List Patient Active Problem List   Diagnosis Date Noted  . Special screening for malignant neoplasms, colon    OCCUPATIONAL THERAPY DISCHARGE SUMMARY  Visits from Start of Care: 15  Current functional level related to goals / functional outcomes: See above  Remaining deficits: See above   Education / Equipment: See above Plan: Patient agrees to discharge.  Patient goals were partially met. Patient is being discharged due to meeting the stated rehab  goals.  ?????meeting maximum potential for therapy services.          Ailene Ravel, OTR/L,CBIS  [phone]  05/22/2017, 5:19 PM  [facility] [address] [place], [place], [address] Phone: [phone]   Fax:  [phone]  Name: Jacob Arellano MRN: [id number] Date of Birth: 1956/07/12

## 2017-05-25 ENCOUNTER — Encounter (HOSPITAL_COMMUNITY): Payer: 59 | Admitting: Specialist

## 2017-08-21 ENCOUNTER — Other Ambulatory Visit (HOSPITAL_COMMUNITY): Payer: Self-pay | Admitting: Physical Medicine and Rehabilitation

## 2017-08-21 DIAGNOSIS — M961 Postlaminectomy syndrome, not elsewhere classified: Secondary | ICD-10-CM

## 2017-08-26 ENCOUNTER — Ambulatory Visit (HOSPITAL_COMMUNITY)
Admission: RE | Admit: 2017-08-26 | Discharge: 2017-08-26 | Disposition: A | Discharge status: Home / Self Care | Payer: 59 | Source: Ambulatory Visit | Attending: Physical Medicine and Rehabilitation | Admitting: Physical Medicine and Rehabilitation

## 2017-08-26 DIAGNOSIS — M5126 Other intervertebral disc displacement, lumbar region: Secondary | ICD-10-CM | POA: Insufficient documentation

## 2017-08-26 DIAGNOSIS — Z9889 Other specified postprocedural states: Secondary | ICD-10-CM | POA: Diagnosis not present

## 2017-08-26 DIAGNOSIS — M2578 Osteophyte, vertebrae: Secondary | ICD-10-CM | POA: Insufficient documentation

## 2017-08-26 DIAGNOSIS — M961 Postlaminectomy syndrome, not elsewhere classified: Secondary | ICD-10-CM | POA: Diagnosis present

## 2017-08-26 LAB — POCT I-STAT CREATININE: CREATININE: 1.1 mg/dL (ref 0.61–1.24)

## 2017-08-26 MED ORDER — GADOBENATE DIMEGLUMINE 529 MG/ML IV SOLN
20.0000 mL | Freq: Once | INTRAVENOUS | Status: AC | PRN
  Administered 2017-08-26: 20 mL via INTRAVENOUS

## 2017-11-17 ENCOUNTER — Other Ambulatory Visit: Payer: Self-pay | Admitting: Neurosurgery

## 2017-11-17 ENCOUNTER — Ambulatory Visit (INDEPENDENT_AMBULATORY_CARE_PROVIDER_SITE_OTHER): Payer: 59

## 2017-11-17 DIAGNOSIS — M1611 Unilateral primary osteoarthritis, right hip: Secondary | ICD-10-CM | POA: Diagnosis not present

## 2017-11-17 DIAGNOSIS — M25551 Pain in right hip: Secondary | ICD-10-CM

## 2017-12-11 DIAGNOSIS — M47816 Spondylosis without myelopathy or radiculopathy, lumbar region: Secondary | ICD-10-CM | POA: Diagnosis not present

## 2017-12-11 DIAGNOSIS — M1611 Unilateral primary osteoarthritis, right hip: Secondary | ICD-10-CM | POA: Diagnosis not present

## 2017-12-11 DIAGNOSIS — M160 Bilateral primary osteoarthritis of hip: Secondary | ICD-10-CM | POA: Diagnosis not present

## 2017-12-11 DIAGNOSIS — Z01818 Encounter for other preprocedural examination: Secondary | ICD-10-CM | POA: Diagnosis not present

## 2017-12-11 DIAGNOSIS — M11252 Other chondrocalcinosis, left hip: Secondary | ICD-10-CM | POA: Diagnosis not present

## 2017-12-11 DIAGNOSIS — Z7982 Long term (current) use of aspirin: Secondary | ICD-10-CM | POA: Diagnosis not present

## 2017-12-17 DIAGNOSIS — M12811 Other specific arthropathies, not elsewhere classified, right shoulder: Secondary | ICD-10-CM | POA: Diagnosis not present

## 2017-12-17 DIAGNOSIS — Z09 Encounter for follow-up examination after completed treatment for conditions other than malignant neoplasm: Secondary | ICD-10-CM | POA: Diagnosis not present

## 2017-12-17 DIAGNOSIS — M25511 Pain in right shoulder: Secondary | ICD-10-CM | POA: Diagnosis not present

## 2017-12-17 DIAGNOSIS — M75101 Unspecified rotator cuff tear or rupture of right shoulder, not specified as traumatic: Secondary | ICD-10-CM | POA: Diagnosis not present

## 2017-12-17 DIAGNOSIS — Z9889 Other specified postprocedural states: Secondary | ICD-10-CM | POA: Diagnosis not present

## 2017-12-17 DIAGNOSIS — M19011 Primary osteoarthritis, right shoulder: Secondary | ICD-10-CM | POA: Diagnosis not present

## 2017-12-29 ENCOUNTER — Ambulatory Visit (HOSPITAL_COMMUNITY): Payer: BLUE CROSS/BLUE SHIELD | Attending: Adult Reconstructive Orthopaedic Surgery

## 2017-12-29 ENCOUNTER — Encounter (HOSPITAL_COMMUNITY): Payer: Self-pay

## 2017-12-29 DIAGNOSIS — R29898 Other symptoms and signs involving the musculoskeletal system: Secondary | ICD-10-CM | POA: Diagnosis not present

## 2017-12-29 DIAGNOSIS — M6281 Muscle weakness (generalized): Secondary | ICD-10-CM | POA: Diagnosis not present

## 2017-12-29 DIAGNOSIS — R262 Difficulty in walking, not elsewhere classified: Secondary | ICD-10-CM | POA: Diagnosis not present

## 2017-12-29 DIAGNOSIS — M25551 Pain in right hip: Secondary | ICD-10-CM

## 2017-12-29 NOTE — Therapy (Signed)
[facility] [place], [place], [address] Phone: [phone]   Fax:  [phone]  Physical Therapy Evaluation  Patient Details  Name: Jacob Arellano MRN: [id number] Date of Birth: 1956-05-02 Referring Provider: Penelope Coop, MD   Encounter Date: 12/29/2017  PT End of Session - 12/29/17 1612    Visit Number  1    Number of Visits  13    Date for PT Re-Evaluation  02/19/18 mini reassess 01/29/18    Authorization Type  [employer] Other    Authorization Time Period  12/29/17 to 02/19/18 (POC is 6 weeks after f/u visit s/p R THA on 01/05/18)    Authorization - Visit Number  1    Authorization - Number of Visits  30    PT Start Time  1524    PT Stop Time  1604    PT Time Calculation (min)  40 min    Activity Tolerance  Patient tolerated treatment well    Behavior During Therapy  [facility] for tasks assessed/performed       Past Medical History:  Diagnosis Date  . Depression   . Hypercholesteremia   . Hypertension   . Sleep apnea     Past Surgical History:  Procedure Laterality Date  . BACK SURGERY    . broken nose    . CATARACT EXTRACTION W/PHACO Right 11/01/2012   Procedure: CATARACT EXTRACTION PHACO AND INTRAOCULAR LENS PLACEMENT (IOC);  Surgeon: Tonny Branch, MD;  Location: [facility];  Service: Ophthalmology;  Laterality: Right;  CDE=7.50  . CATARACT EXTRACTION W/PHACO Left 11/15/2012   Procedure: CATARACT EXTRACTION PHACO AND INTRAOCULAR LENS PLACEMENT (IOC);  Surgeon: Tonny Branch, MD;  Location: [facility];  Service: Ophthalmology;  Laterality: Left;  CDE 6.68  . COLONOSCOPY N/A 10/31/2016   Procedure: COLONOSCOPY;  Surgeon: Danie Binder, MD;  Location: [facility];  Service: Endoscopy;  Laterality: N/A;  11:45 pm  . GANGLION CYST EXCISION     Bilateral hands  . left bicep repair    . NASAL SEPTUM SURGERY    . ROTATOR CUFF REPAIR     right  . TONSILLECTOMY    . VITRECTOMY      There were no vitals filed for this  visit.   Subjective Assessment - 12/29/17 1526    Subjective  Pt states that he is having R THA on 01/05/18 by Dr. Jefferson Fuel. He reports having hip pain for the last 2 years. They thought it was coming from his back; he had back surgery on 09/18/17 for a partial discectomy and removal of bone spur around L3-4. When his hip pain persisted after the lumbar surgery, that is when they found that his hip had a significant loss of cartilage, bone spurs and a bone cyst. His main issue is his pain and walking and it's starting to affect his R knee and R ankle. He states that Dr. Jefferson Fuel is planning on doing a posterior approach.     Limitations  Walking    How long can you sit comfortably?  about 30-45 mins    How long can you stand comfortably?  5 mins    How long can you walk comfortably?  none, every step hurts and worsens every day    Patient Stated Goals  normal gait and no pain    Currently in Pain?  Yes    Pain Score  4     Pain Location  Hip    Pain Orientation  Right    Pain Descriptors / Indicators  Dull;Sharp    Pain Type  Chronic pain    Pain Onset  More than a month ago    Pain Frequency  Constant    Aggravating Factors   any WB activity    Pain Relieving Factors  rest    Effect of Pain on Daily Activities  significant increase         [facility] PT Assessment - 12/29/17 0001      Assessment   Medical Diagnosis  Primary OA of R hip    Referring Provider  Penelope Coop, MD    Onset Date/Surgical Date  12/30/15    Next MD Visit  01/05/18 for R THA    Prior Therapy  OT for shoulder; no PT      Balance Screen   Has the patient fallen in the past 6 months  No    Has the patient had a decrease in activity level because of a fear of falling?   No    Is the patient reluctant to leave their home because of a fear of falling?   No      Prior Function   Level of Independence  Independent    Vocation  Full time employment    Leisure  yard work, exercise, read      Observation/Other  Assessments   Focus on Therapeutic Outcomes (FOTO)   to be completed next visit      Functional Tests   Functional tests  Sit to Stand      Sit to Stand   Comments  30 sec chair rise test: 10      ROM / Strength   AROM / PROM / Strength  Strength;AROM      AROM   AROM Assessment Site  Hip    Right Hip Extension  5    Right Hip Flexion  90    Right Hip External Rotation   20    Right Hip Internal Rotation   2      Strength   Strength Assessment Site  Hip;Knee;Ankle    Right Hip Flexion  3+/5    Right Hip Extension  2+/5    Right Hip ABduction  3/5    Left Hip Flexion  4+/5    Left Hip Extension  4-/5    Left Hip ABduction  4/5    Right Knee Flexion  5/5    Right Knee Extension  5/5    Left Knee Flexion  5/5    Left Knee Extension  5/5    Right Ankle Dorsiflexion  5/5    Left Ankle Dorsiflexion  5/5      Palpation   Palpation comment  no tenderness to palpation throughout       Ambulation/Gait   Ambulation Distance (Feet)  592 Feet 3MWT    Assistive device  None    Gait Pattern  Step-through pattern;Decreased hip/knee flexion - right;Decreased dorsiflexion - right;Trendelenburg;Antalgic;Lateral trunk lean to right incr lumbar ext    Gait Comments  R hip pain increased to 8-9/10, was 3/10 prior to      Balance   Balance Assessed  Yes      Static Standing Balance   Static Standing - Balance Support  No upper extremity supported    Static Standing Balance -  Activities   Single Leg Stance - Right Leg;Single Leg Stance - Left Leg    Static Standing - Comment/# of Minutes  R:  11 sec L: 19 sec          Objective measurements completed on examination: See above findings.        PT Education - 12/29/17 1605    Education provided  Yes    Education Details  exam findings, POC, HEP, posterior hip precautions, general info on THRs    Person(s) Educated  Patient    Methods  Explanation;Handout    Comprehension  Verbalized understanding       PT Short Term Goals  - 12/29/17 1634      PT SHORT TERM GOAL #1   Title  Pt will be independent with HEP and perform consistently in order to decrease pain and improve overall function.    Time  3    Period  Weeks    Status  New    Target Date  01/29/18      PT SHORT TERM GOAL #2   Title  Pt will have improved R hip AROM by 5 deg throughout in order to decrease pain and maximize overall function.    Time  3    Period  Weeks    Status  New      PT SHORT TERM GOAL #3   Title  Pt will be able to perform R SLS for at least 20 sec to be symmetrical with LLE in order to maximize gait on uneven ground and demo improved overall strength.    Time  3    Period  Weeks    Status  New      PT SHORT TERM GOAL #4   Title  Pt will report being able to sleep through the night without awakening due to pain at 3x/week or > to maximzie recovery.     Time  3    Period  Weeks    Status  New        PT Long Term Goals - 12/29/17 1637      PT LONG TERM GOAL #1   Title  Pt will have improved R hip flexion, IR, and ER AROM by 10 deg or > in order to further decrease pain and improved overall gait and functional tasks.    Time  6    Period  Weeks    Status  New    Target Date  02/19/18      PT LONG TERM GOAL #2   Title  Pt will have improved R hip MMT by 1 grade throughout in order to decrease pain and improve gait, balance, and overall function.    Time  6    Period  Weeks    Status  New      PT LONG TERM GOAL #3   Title  Pt will have improved 30 sec chair rise test to at least 15x or > in order to demo improved balance and functional BLE strength.     Time  6    Period  Weeks    Status  New      PT LONG TERM GOAL #4   Title  Pt will have improved 3MWT by 114ft or > with no AD, min to no gait deviations, and no increases in pain to demo improved functional strength and promote community access.     Time  6    Period  Weeks    Status  New             Plan - 12/29/17 1628    Clinical Impression  Statement  Pt is pleasant 62 YO M who presents to OPPT with c/o chronic R hip pain for his pre-op visit prior to a R THA on 01/05/18 by Dr. Jefferson Fuel. He has deficits in R hip ROM, MMT, balance, functional strength, gait, and increased pain throughout. Per pt, he is having a posterior THA; PT educated pt and provided handout on posterior hip precautions. PT provided pt with handout of hip strengthening exercises and arm chair push-ups for him to perform between now and his surgery. Pt needs skilled PT intervention to address these impairments and maximize his overall return to PLOF following surgery. Per his pamphlet provided by his surgeon, he is to make a f/u OPPT appointment 2 days after discharge ad his is set to d/c on 01/06/18. PT making pt's POC 2x/week for 6 weeks beginning on his first f/u visit on 01/08/18.    History and Personal Factors relevant to plan of care:  chronicity of issue, h/o lumbar surgery in 09/18/17 (per pt he thinks L3-4); h/o RTC/shoulder pain bil; otherwise, relatively healthy individual without significant medical history    Clinical Presentation  Stable    Clinical Presentation due to:  ROM, MMT, SLS, 30sec chair rise test, 3MWT, functional strenght, gait    Clinical Decision Making  Low    Rehab Potential  Good    PT Frequency  2x / week    PT Duration  6 weeks    PT Treatment/Interventions  ADLs/Self Care Home Management;Cryotherapy;Electrical Stimulation;Moist Heat;Traction;Ultrasound;DME Instruction;Gait training;Stair training;Functional mobility training;Therapeutic activities;Therapeutic exercise;Balance training;Neuromuscular re-education;Patient/family education;Manual techniques;Scar mobilization;Passive range of motion;Dry needling;Energy conservation;Taping    PT Next Visit Plan  reassess pt as he will have had R THR upon f/u; review goals and update as necessary; initiate POC as appropriate during f/u (manual, therex, stretching, balance and gait training as needed)     PT Home Exercise Plan  eval: arm chair push-ups, bridging, bent knee raise, SLR, STS, clams, posterior hip precaution handout    Consulted and Agree with Plan of Care  Patient       Patient will benefit from skilled therapeutic intervention in order to improve the following deficits and impairments:  Abnormal gait, Decreased activity tolerance, Decreased balance, Decreased range of motion, Decreased strength, Difficulty walking, Hypomobility, Improper body mechanics, Pain, Increased muscle spasms, Increased fascial restricitons  Visit Diagnosis: Pain in right hip - Plan: PT plan of care cert/re-cert  Difficulty in walking, not elsewhere classified - Plan: PT plan of care cert/re-cert  Muscle weakness (generalized) - Plan: PT plan of care cert/re-cert  Other symptoms and signs involving the musculoskeletal system - Plan: PT plan of care cert/re-cert     Problem List Patient Active Problem List   Diagnosis Date Noted  . Hip pain, acute, right 11/17/2017  . Special screening for malignant neoplasms, colon         Geraldine Solar PT, DPT  [facility] [address] [place], [place], [address] Phone: [phone]   Fax:  [phone]  Name: TSUTOMU BARFOOT MRN: [id number] Date of Birth: 01/11/1956

## 2018-01-05 DIAGNOSIS — Z96641 Presence of right artificial hip joint: Secondary | ICD-10-CM | POA: Diagnosis not present

## 2018-01-05 DIAGNOSIS — G8918 Other acute postprocedural pain: Secondary | ICD-10-CM | POA: Diagnosis not present

## 2018-01-05 DIAGNOSIS — I10 Essential (primary) hypertension: Secondary | ICD-10-CM | POA: Diagnosis not present

## 2018-01-05 DIAGNOSIS — G4733 Obstructive sleep apnea (adult) (pediatric): Secondary | ICD-10-CM | POA: Diagnosis not present

## 2018-01-05 DIAGNOSIS — F329 Major depressive disorder, single episode, unspecified: Secondary | ICD-10-CM | POA: Diagnosis not present

## 2018-01-05 DIAGNOSIS — Z87891 Personal history of nicotine dependence: Secondary | ICD-10-CM | POA: Diagnosis not present

## 2018-01-05 DIAGNOSIS — M1611 Unilateral primary osteoarthritis, right hip: Secondary | ICD-10-CM | POA: Diagnosis not present

## 2018-01-05 DIAGNOSIS — D649 Anemia, unspecified: Secondary | ICD-10-CM | POA: Diagnosis not present

## 2018-01-05 DIAGNOSIS — F988 Other specified behavioral and emotional disorders with onset usually occurring in childhood and adolescence: Secondary | ICD-10-CM | POA: Diagnosis not present

## 2018-01-05 DIAGNOSIS — Z471 Aftercare following joint replacement surgery: Secondary | ICD-10-CM | POA: Diagnosis not present

## 2018-01-08 ENCOUNTER — Ambulatory Visit (HOSPITAL_COMMUNITY): Payer: BLUE CROSS/BLUE SHIELD

## 2018-01-08 ENCOUNTER — Encounter (HOSPITAL_COMMUNITY): Payer: Self-pay

## 2018-01-08 DIAGNOSIS — R29898 Other symptoms and signs involving the musculoskeletal system: Secondary | ICD-10-CM

## 2018-01-08 DIAGNOSIS — M6281 Muscle weakness (generalized): Secondary | ICD-10-CM | POA: Diagnosis not present

## 2018-01-08 DIAGNOSIS — M25551 Pain in right hip: Secondary | ICD-10-CM

## 2018-01-08 DIAGNOSIS — R262 Difficulty in walking, not elsewhere classified: Secondary | ICD-10-CM

## 2018-01-08 NOTE — Therapy (Signed)
[place], [place], [address] Phone: [phone]   Fax:  [phone]  Physical Therapy Treatment  Patient Details  Name: Jacob Arellano MRN: [id number] Date of Birth: [birth year] Referring Provider: Penelope Coop, MD   Encounter Date: 01/08/2018  PT End of Session - 01/08/18 0822    Visit Number  2    Number of Visits  13    Date for PT Re-Evaluation  02/19/18 mini reassess 01/29/18    Authorization Type  BCBS Other    Authorization Time Period  12/29/17 to 02/19/18 (POC is 6 weeks after f/u visit s/p R THA on 01/05/18)    Authorization - Visit Number  2    Authorization - Number of Visits  30    PT Start Time  0820    PT Stop Time  0855    PT Time Calculation (min)  35 min    Activity Tolerance  Patient tolerated treatment well    Behavior During Therapy  [facility] for tasks assessed/performed       Past Medical History:  Diagnosis Date  . Depression   . Hypercholesteremia   . Hypertension   . Sleep apnea     Past Surgical History:  Procedure Laterality Date  . BACK SURGERY    . broken nose    . CATARACT EXTRACTION W/PHACO Right 11/01/2012   Procedure: CATARACT EXTRACTION PHACO AND INTRAOCULAR LENS PLACEMENT (IOC);  Surgeon: Tonny Branch, MD;  Location: [facility];  Service: Ophthalmology;  Laterality: Right;  CDE=7.50  . CATARACT EXTRACTION W/PHACO Left 11/15/2012   Procedure: CATARACT EXTRACTION PHACO AND INTRAOCULAR LENS PLACEMENT (IOC);  Surgeon: Tonny Branch, MD;  Location: [facility];  Service: Ophthalmology;  Laterality: Left;  CDE 6.68  . COLONOSCOPY N/A 10/31/2016   Procedure: COLONOSCOPY;  Surgeon: Danie Binder, MD;  Location: [facility];  Service: Endoscopy;  Laterality: N/A;  11:45 pm  . GANGLION CYST EXCISION     Bilateral hands  . left bicep repair    . NASAL SEPTUM SURGERY    . ROTATOR CUFF REPAIR     right  . TONSILLECTOMY    . VITRECTOMY      There were no vitals filed for this  visit.  Subjective Assessment - 01/08/18 0823    Subjective  Pt states that his hip feels so much better. He states that he is sore but it is a good kind of sore. His back pain is feeling better as well.     Limitations  Walking    How long can you sit comfortably?  about 30-45 mins    How long can you stand comfortably?  5 mins    How long can you walk comfortably?  none, every step hurts and worsens every day    Patient Stated Goals  normal gait and no pain    Currently in Pain?  Yes    Pain Score  3     Pain Location  Hip    Pain Orientation  Right    Pain Descriptors / Indicators  Aching;Dull    Pain Type  Surgical pain    Pain Onset  In the past 7 days    Pain Frequency  Constant    Aggravating Factors   any WB activity    Pain Relieving Factors  rest    Effect of Pain on Daily Activities  significant increase         [facility] PT Assessment -  01/08/18 0001      Assessment   Medical Diagnosis  Primary OA of R hip    Referring Provider  Penelope Coop, MD    Onset Date/Surgical Date  01/05/18    Next MD Visit  01/18/18 2-week post-op    Prior Therapy  OT for shoulder; no PT      Precautions   Precautions  Posterior Hip    Precaution Booklet Issued  Yes (comment) provided on 12/29/17 during pre-op visit      Sit to Stand   Comments  30 sec chair rise test: 12 (decreased weight shift off RLE)      ROM / Strength   AROM / PROM / Strength  Strength      Strength   Right Hip Flexion  3-/5    Right Hip Extension  2+/5    Right Hip ABduction  2+/5    Left Hip Flexion  5/5    Left Hip Extension  4-/5    Left Hip ABduction  4/5 used MMT from 5/7 as pt could not lay on R side d/t pain    Right Knee Flexion  5/5    Right Knee Extension  4+/5    Left Knee Flexion  5/5    Left Knee Extension  5/5    Right Ankle Dorsiflexion  5/5    Left Ankle Dorsiflexion  5/5      Ambulation/Gait   Ambulation Distance (Feet)  324 Feet 3MWT    Assistive device  Rolling walker    Gait  Pattern  Step-through pattern;Decreased stance time - right;Decreased step length - left;Antalgic;Trendelenburg    Gait Comments  decreased lumbar compensations compared to 12/29/17, increased L pelvic rotation, decreased L hip extension; no increased pain, just muscle soreness at anteiror hip      Balance   Balance Assessed  Yes      Static Standing Balance   Static Standing - Balance Support  No upper extremity supported    Static Standing Balance -  Activities   Single Leg Stance - Right Leg;Single Leg Stance - Left Leg    Static Standing - Comment/# of Minutes  R: 0 sec; L: 11.5 sec            PT Education - 01/08/18 0822    Education provided  Yes    Education Details  reassessed goals, reviewed hip precautions, continue HEP    Person(s) Educated  Patient    Methods  Explanation;Demonstration    Comprehension  Verbalized understanding;Returned demonstration       PT Short Term Goals - 01/08/18 0825      PT SHORT TERM GOAL #1   Title  Pt will be independent with HEP and perform consistently in order to decrease pain and improve overall function.    Time  3    Period  Weeks    Status  Achieved      PT SHORT TERM GOAL #2   Title  Pt will have improved R hip AROM by 5 deg throughout in order to decrease pain and maximize overall function.    Baseline  5/17: did not assess this date d/t hip precautions    Time  3    Period  Weeks    Status  On-going      PT SHORT TERM GOAL #3   Title  Pt will be able to perform R SLS for at least 20 sec to be symmetrical with LLE in order to maximize  gait on uneven ground and demo improved overall strength.    Baseline  5/17: 0 sec on R, 11 sec on L    Time  3    Period  Weeks    Status  On-going      PT SHORT TERM GOAL #4   Title  Pt will report being able to sleep through the night without awakening due to pain at 3x/week or > to maximzie recovery.     Baseline  5/17: has improved since the surgery but states it could be due to pain  meds    Time  3    Period  Weeks    Status  On-going        PT Long Term Goals - 01/08/18 0827      PT LONG TERM GOAL #1   Title  Pt will have improved R hip flexion, IR, and ER AROM by 10 deg or > in order to further decrease pain and improved overall gait and functional tasks.    Baseline  5/17: did not assess this date d/t hip precautions    Time  6    Period  Weeks    Status  On-going      PT LONG TERM GOAL #2   Title  Pt will have improved R hip MMT by 1 grade throughout in order to decrease pain and improve gait, balance, and overall function.    Time  6    Period  Weeks    Status  On-going      PT LONG TERM GOAL #3   Title  Pt will have improved 30 sec chair rise test to at least 15x or > in order to demo improved balance and functional BLE strength.     Baseline  5/17: 12, decreased weight shift over RLE    Time  6    Period  Weeks    Status  On-going      PT LONG TERM GOAL #4   Title  Pt will have improved 3MWT by 124ft or > with no AD, min to no gait deviations, and no increases in pain to demo improved functional strength and promote community access.     Baseline  5/17: 365ft with RW    Time  6    Period  Weeks    Status  On-going            Plan - 01/08/18 1306    Clinical Impression Statement  Pt returns to OPPT s/p posterior approach R THA on 01/05/18. He presents with post-op deficits in hip MMT, balance, gait, functional strength, and incision pain. Pt able to verbalized posterior hip precautions without cueing. PT did not reassess ROM this date due to hip precautions. Overall, pt states that his hip pain has greatly improved from his pre-op visit on 12/29/17. He is currently ambulating with a RW. Pt verbalizes he is continuing his HEP provided to him in the hospital and PT educated him to continue these for now. Pt needs continued skilled PT intervention to address impairments in order to maximize strength and return to PLOF.     Rehab Potential  Good     PT Frequency  2x / week    PT Duration  6 weeks    PT Treatment/Interventions  ADLs/Self Care Home Management;Cryotherapy;Electrical Stimulation;Moist Heat;Traction;Ultrasound;DME Instruction;Gait training;Stair training;Functional mobility training;Therapeutic activities;Therapeutic exercise;Balance training;Neuromuscular re-education;Patient/family education;Manual techniques;Scar mobilization;Passive range of motion;Dry needling;Energy conservation;Taping    PT Next Visit Plan  review goals;  begin strengthening, stretching, balance, gait training, and functional strengthening within posterior hip precautions    PT Home Exercise Plan  5/17: ankle pumps, HS sets, heel slides, supine abd, glute sets, quad sets (continuation from acute care exercises provided at d/c)    Consulted and Agree with Plan of Care  Patient       Patient will benefit from skilled therapeutic intervention in order to improve the following deficits and impairments:  Abnormal gait, Decreased activity tolerance, Decreased balance, Decreased range of motion, Decreased strength, Difficulty walking, Hypomobility, Improper body mechanics, Pain, Increased muscle spasms, Increased fascial restricitons  Visit Diagnosis: Pain in right hip  Difficulty in walking, not elsewhere classified  Muscle weakness (generalized)  Other symptoms and signs involving the musculoskeletal system     Problem List Patient Active Problem List   Diagnosis Date Noted  . Hip pain, acute, right 11/17/2017  . Special screening for malignant neoplasms, colon       Geraldine Solar PT, DPT  [facility] [address] [place], [place], [address] Phone: [phone]   Fax:  [phone]  Name: Jacob Arellano MRN: [id number] Date of Birth: Sep 04, 1955

## 2018-01-11 ENCOUNTER — Ambulatory Visit (HOSPITAL_COMMUNITY): Payer: BLUE CROSS/BLUE SHIELD

## 2018-01-11 DIAGNOSIS — M6281 Muscle weakness (generalized): Secondary | ICD-10-CM | POA: Diagnosis not present

## 2018-01-11 DIAGNOSIS — R29898 Other symptoms and signs involving the musculoskeletal system: Secondary | ICD-10-CM | POA: Diagnosis not present

## 2018-01-11 DIAGNOSIS — R262 Difficulty in walking, not elsewhere classified: Secondary | ICD-10-CM

## 2018-01-11 DIAGNOSIS — M25551 Pain in right hip: Secondary | ICD-10-CM | POA: Diagnosis not present

## 2018-01-11 NOTE — Therapy (Signed)
[place], [place], [address] Phone: [phone]   Fax:  [phone]  Physical Therapy Treatment  Patient Details  Name: Jacob Arellano MRN: [id number] Date of Birth: September 28, 1955 Referring Provider: Penelope Coop, MD   Encounter Date: 01/11/2018  PT End of Session - 01/11/18 1516    Visit Number  3    Number of Visits  13    Date for PT Re-Evaluation  02/19/18 mini reassess 01/29/18    Authorization Type  BCBS Other    Authorization Time Period  12/29/17 to 02/19/18 (POC is 6 weeks after f/u visit s/p R THA on 01/05/18)    Authorization - Visit Number  3    Authorization - Number of Visits  30    PT Start Time  1515    PT Stop Time  1555    PT Time Calculation (min)  40 min    Activity Tolerance  Patient tolerated treatment well    Behavior During Therapy  [facility] for tasks assessed/performed       Past Medical History:  Diagnosis Date  . Depression   . Hypercholesteremia   . Hypertension   . Sleep apnea     Past Surgical History:  Procedure Laterality Date  . BACK SURGERY    . broken nose    . CATARACT EXTRACTION W/PHACO Right 11/01/2012   Procedure: CATARACT EXTRACTION PHACO AND INTRAOCULAR LENS PLACEMENT (IOC);  Surgeon: Tonny Branch, MD;  Location: [facility];  Service: Ophthalmology;  Laterality: Right;  CDE=7.50  . CATARACT EXTRACTION W/PHACO Left 11/15/2012   Procedure: CATARACT EXTRACTION PHACO AND INTRAOCULAR LENS PLACEMENT (IOC);  Surgeon: Tonny Branch, MD;  Location: [facility];  Service: Ophthalmology;  Laterality: Left;  CDE 6.68  . COLONOSCOPY N/A 10/31/2016   Procedure: COLONOSCOPY;  Surgeon: Danie Binder, MD;  Location: [facility];  Service: Endoscopy;  Laterality: N/A;  11:45 pm  . GANGLION CYST EXCISION     Bilateral hands  . left bicep repair    . NASAL SEPTUM SURGERY    . ROTATOR CUFF REPAIR     right  . TONSILLECTOMY    . VITRECTOMY      There were no vitals filed for this  visit.  Subjective Assessment - 01/11/18 1519    Subjective  Pt states that he's feeling good. His hip is starting to feel less sore and he feels like he is putting less weight on his RW.     Limitations  Walking    How long can you sit comfortably?  about 30-45 mins    How long can you stand comfortably?  5 mins    How long can you walk comfortably?  none, every step hurts and worsens every day    Patient Stated Goals  normal gait and no pain    Currently in Pain?  No/denies    Pain Onset  In the past 7 days         [facility] PT Assessment - 01/11/18 0001      Assessment   Medical Diagnosis  Primary OA of R hip    Referring Provider  Penelope Coop, MD    Onset Date/Surgical Date  01/05/18    Next MD Visit  01/20/18           [facility] Adult PT Treatment/Exercise - 01/11/18 0001      Exercises   Exercises  Knee/Hip      Knee/Hip Exercises: Standing  Lateral Step Up  Right;20 reps;Hand Hold: 2;Step Height: 4"    Forward Step Up  Right;20 reps;Hand Hold: 2;Step Height: 4"    SLS  5x10-15" holds, no UE    SLS with Vectors  on RLE, x5RT    Gait Training  x2 laps with SPC; good control, sequencing, and symmetrical    Other Standing Knee Exercises  sidestepping in // bars x3RT      Knee/Hip Exercises: Supine   Quad Sets  Right;10 reps    Quad Sets Limitations  3" holds    Short Arc Quad Sets  Right;3 sets;10 reps    Short Arc Quad Sets Limitations  3#, basketball    Heel Slides  Right;10 reps    Bridges  Both;3 sets;10 reps    Straight Leg Raises  Both;3 sets;10 reps    Other Supine Knee/Hip Exercises  supine clams RTB 3x10            PT Education - 01/11/18 1556    Education provided  Yes    Education Details  gait with SPC, exercise technique, continue HEP    Person(s) Educated  Patient    Methods  Explanation;Demonstration    Comprehension  Verbalized understanding;Returned demonstration       PT Short Term Goals - 01/08/18 0825      PT SHORT TERM GOAL  #1   Title  Pt will be independent with HEP and perform consistently in order to decrease pain and improve overall function.    Time  3    Period  Weeks    Status  Achieved      PT SHORT TERM GOAL #2   Title  Pt will have improved R hip AROM by 5 deg throughout in order to decrease pain and maximize overall function.    Baseline  5/17: did not assess this date d/t hip precautions    Time  3    Period  Weeks    Status  On-going      PT SHORT TERM GOAL #3   Title  Pt will be able to perform R SLS for at least 20 sec to be symmetrical with LLE in order to maximize gait on uneven ground and demo improved overall strength.    Baseline  5/17: 0 sec on R, 11 sec on L    Time  3    Period  Weeks    Status  On-going      PT SHORT TERM GOAL #4   Title  Pt will report being able to sleep through the night without awakening due to pain at 3x/week or > to maximzie recovery.     Baseline  5/17: has improved since the surgery but states it could be due to pain meds    Time  3    Period  Weeks    Status  On-going        PT Long Term Goals - 01/08/18 0827      PT LONG TERM GOAL #1   Title  Pt will have improved R hip flexion, IR, and ER AROM by 10 deg or > in order to further decrease pain and improved overall gait and functional tasks.    Baseline  5/17: did not assess this date d/t hip precautions    Time  6    Period  Weeks    Status  On-going      PT LONG TERM GOAL #2   Title  Pt will have improved  R hip MMT by 1 grade throughout in order to decrease pain and improve gait, balance, and overall function.    Time  6    Period  Weeks    Status  On-going      PT LONG TERM GOAL #3   Title  Pt will have improved 30 sec chair rise test to at least 15x or > in order to demo improved balance and functional BLE strength.     Baseline  5/17: 12, decreased weight shift over RLE    Time  6    Period  Weeks    Status  On-going      PT LONG TERM GOAL #4   Title  Pt will have improved 3MWT by  143ft or > with no AD, min to no gait deviations, and no increases in pain to demo improved functional strength and promote community access.     Baseline  5/17: 313ft with RW    Time  6    Period  Weeks    Status  On-going            Plan - 01/11/18 1600    Clinical Impression Statement  Began session by reviewing goals and issuing copy of eval; no f/u questions afterwards. Initiated therex this date, all with good tolerance. Pt with increased difficulty performing sidelying clams, even without resistance so had pt perform in supine. Performed gait training with [place] this date and pt had good form, with symmetrical step lengths and min to no antalgia noted. PT cleared pt to use his [facility] for [facility] and short community distances and he verbalized understanding. Rest of session focused on hip strengthening and balance. No pain reported during session, just some muscle fatigue. Continue as planned, progressing as able within posterior hip precautions.     Rehab Potential  Good    PT Frequency  2x / week    PT Duration  6 weeks    PT Treatment/Interventions  ADLs/Self Care Home Management;Cryotherapy;Electrical Stimulation;Moist Heat;Traction;Ultrasound;DME Instruction;Gait training;Stair training;Functional mobility training;Therapeutic activities;Therapeutic exercise;Balance training;Neuromuscular re-education;Patient/family education;Manual techniques;Scar mobilization;Passive range of motion;Dry needling;Energy conservation;Taping    PT Next Visit Plan  continue strengthening, stretching, balance, gait training, and functional strengthening within posterior hip precautions; add mini squats and lunging, continue step ups    PT Home Exercise Plan  5/17: ankle pumps, HS sets, heel slides, supine abd, glute sets, quad sets (continuation from acute care exercises provided at d/c)    Consulted and Agree with Plan of Care  Patient       Patient will benefit from skilled therapeutic intervention in order  to improve the following deficits and impairments:  Abnormal gait, Decreased activity tolerance, Decreased balance, Decreased range of motion, Decreased strength, Difficulty walking, Hypomobility, Improper body mechanics, Pain, Increased muscle spasms, Increased fascial restricitons  Visit Diagnosis: Pain in right hip  Difficulty in walking, not elsewhere classified  Muscle weakness (generalized)  Other symptoms and signs involving the musculoskeletal system     Problem List Patient Active Problem List   Diagnosis Date Noted  . Hip pain, acute, right 11/17/2017  . Special screening for malignant neoplasms, colon       Geraldine Solar PT, DPT  [facility] [address] [place], [place], [address] Phone: [phone]   Fax:  [phone]  Name: MAKIAH CLAUSON MRN: [id number] Date of Birth: 1956/07/20

## 2018-01-14 ENCOUNTER — Ambulatory Visit (HOSPITAL_COMMUNITY): Payer: BLUE CROSS/BLUE SHIELD

## 2018-01-14 DIAGNOSIS — M6281 Muscle weakness (generalized): Secondary | ICD-10-CM | POA: Diagnosis not present

## 2018-01-14 DIAGNOSIS — M25551 Pain in right hip: Secondary | ICD-10-CM

## 2018-01-14 DIAGNOSIS — R29898 Other symptoms and signs involving the musculoskeletal system: Secondary | ICD-10-CM | POA: Diagnosis not present

## 2018-01-14 DIAGNOSIS — R262 Difficulty in walking, not elsewhere classified: Secondary | ICD-10-CM | POA: Diagnosis not present

## 2018-01-14 NOTE — Therapy (Signed)
[place], [place], [address] Phone: [phone]   Fax:  [phone]  Physical Therapy Treatment  Patient Details  Name: Jacob Arellano MRN: [id number] Date of Birth: 1956-07-18 Referring Provider: Penelope Coop, MD   Encounter Date: 01/14/2018  PT End of Session - 01/14/18 1119    Visit Number  4    Number of Visits  13    Date for PT Re-Evaluation  02/19/18 mini reassess 01/29/18    Authorization Type  [employer] Other    Authorization Time Period  12/29/17 to 02/19/18 (POC is 6 weeks after f/u visit s/p R THA on 01/05/18)    Authorization - Visit Number  4    Authorization - Number of Visits  30    PT Start Time  1037 pt signed in late    PT Stop Time  1117    PT Time Calculation (min)  40 min    Activity Tolerance  Patient tolerated treatment well    Behavior During Therapy  [facility] for tasks assessed/performed       Past Medical History:  Diagnosis Date  . Depression   . Hypercholesteremia   . Hypertension   . Sleep apnea     Past Surgical History:  Procedure Laterality Date  . BACK SURGERY    . broken nose    . CATARACT EXTRACTION W/PHACO Right 11/01/2012   Procedure: CATARACT EXTRACTION PHACO AND INTRAOCULAR LENS PLACEMENT (IOC);  Surgeon: Tonny Branch, MD;  Location: [facility];  Service: Ophthalmology;  Laterality: Right;  CDE=7.50  . CATARACT EXTRACTION W/PHACO Left 11/15/2012   Procedure: CATARACT EXTRACTION PHACO AND INTRAOCULAR LENS PLACEMENT (IOC);  Surgeon: Tonny Branch, MD;  Location: [facility];  Service: Ophthalmology;  Laterality: Left;  CDE 6.68  . COLONOSCOPY N/A 10/31/2016   Procedure: COLONOSCOPY;  Surgeon: Danie Binder, MD;  Location: [facility];  Service: Endoscopy;  Laterality: N/A;  11:45 pm  . GANGLION CYST EXCISION     Bilateral hands  . left bicep repair    . NASAL SEPTUM SURGERY    . ROTATOR CUFF REPAIR     right  . TONSILLECTOMY    . VITRECTOMY      There were no vitals filed for  this visit.  Subjective Assessment - 01/14/18 1040    Subjective  Pt states that he is feeling really well. He felt good after last session. He is having some groin pain and anteior thigh pain when he lays on his back and on his left side.     Limitations  Walking    How long can you sit comfortably?  about 30-45 mins    How long can you stand comfortably?  5 mins    How long can you walk comfortably?  none, every step hurts and worsens every day    Patient Stated Goals  normal gait and no pain    Currently in Pain?  Yes    Pain Score  1     Pain Location  Hip    Pain Orientation  Right    Pain Descriptors / Indicators  Aching    Pain Type  Surgical pain    Pain Onset  1 to 4 weeks ago    Pain Frequency  Constant    Aggravating Factors   any WB activity    Pain Relieving Factors  rest    Effect of Pain on Daily Activities  increase  [facility] Adult PT Treatment/Exercise - 01/14/18 0001      Knee/Hip Exercises: Stretches   Passive Hamstring Stretch  Right;2 reps;30 seconds    Quad Stretch  Right;3 reps;30 seconds;Limitations    Sports administrator Limitations  prone with rope      Knee/Hip Exercises: Standing   Forward Lunges  Right;20 reps;Limitations    Forward Lunges Limitations  on 4" step    Lateral Step Up  Right;20 reps;Hand Hold: 2;Step Height: 4"    Forward Step Up  Right;20 reps;Hand Hold: 2;Step Height: 4"    Functional Squat  20 reps    Functional Squat Limitations  BUE support on // bars    Rocker Board  2 minutes;Limitations    Rocker Board Limitations  R/L    SLS  RLE, 3x30" on foam, bil 1 fingertip assist      Knee/Hip Exercises: Supine   Short Arc Quad Sets  Right;2 sets;15 reps    Short Arc Quad Sets Limitations  4#, 3" holds    Bridges  Both;2 sets;15 reps    Straight Leg Raises  Both;2 sets;15 reps    Other Supine Knee/Hip Exercises  supine clams RTB 2x15           PT Education - 01/14/18 1055    Education provided  Yes    Education Details   exercise technique, updated HEP    Person(s) Educated  Patient    Methods  Explanation;Demonstration;Handout    Comprehension  Verbalized understanding;Returned demonstration       PT Short Term Goals - 01/08/18 0825      PT SHORT TERM GOAL #1   Title  Pt will be independent with HEP and perform consistently in order to decrease pain and improve overall function.    Time  3    Period  Weeks    Status  Achieved      PT SHORT TERM GOAL #2   Title  Pt will have improved R hip AROM by 5 deg throughout in order to decrease pain and maximize overall function.    Baseline  5/17: did not assess this date d/t hip precautions    Time  3    Period  Weeks    Status  On-going      PT SHORT TERM GOAL #3   Title  Pt will be able to perform R SLS for at least 20 sec to be symmetrical with LLE in order to maximize gait on uneven ground and demo improved overall strength.    Baseline  5/17: 0 sec on R, 11 sec on L    Time  3    Period  Weeks    Status  On-going      PT SHORT TERM GOAL #4   Title  Pt will report being able to sleep through the night without awakening due to pain at 3x/week or > to maximzie recovery.     Baseline  5/17: has improved since the surgery but states it could be due to pain meds    Time  3    Period  Weeks    Status  On-going        PT Long Term Goals - 01/08/18 0827      PT LONG TERM GOAL #1   Title  Pt will have improved R hip flexion, IR, and ER AROM by 10 deg or > in order to further decrease pain and improved overall gait and functional tasks.    Baseline  5/17: did not assess this date d/t hip precautions    Time  6    Period  Weeks    Status  On-going      PT LONG TERM GOAL #2   Title  Pt will have improved R hip MMT by 1 grade throughout in order to decrease pain and improve gait, balance, and overall function.    Time  6    Period  Weeks    Status  On-going      PT LONG TERM GOAL #3   Title  Pt will have improved 30 sec chair rise test to at  least 15x or > in order to demo improved balance and functional BLE strength.     Baseline  5/17: 12, decreased weight shift over RLE    Time  6    Period  Weeks    Status  On-going      PT LONG TERM GOAL #4   Title  Pt will have improved 3MWT by 178ft or > with no AD, min to no gait deviations, and no increases in pain to demo improved functional strength and promote community access.     Baseline  5/17: 332ft with RW    Time  6    Period  Weeks    Status  On-going            Plan - 01/14/18 1119    Clinical Impression Statement  Pt presents to therapy with reports of groin and distal quad tightness. Added prone quad stretch and supine manual groin stretch and pt reported that these really helped those complaints. Rest of session focused on improving overall strength within hip precautions. Updated HEP this date to include SAQ, bridging, and supine clams with RTB. He tolerated increased reps throughout therex this date and required min cues for proper exercise technique. He took his bandaid off of his scar and the incision looks well healing, with no redness or erythema. Pt not reporting any pain at EOS. Continue as planned, progressing as able.     Rehab Potential  Good    PT Frequency  2x / week    PT Duration  6 weeks    PT Treatment/Interventions  ADLs/Self Care Home Management;Cryotherapy;Electrical Stimulation;Moist Heat;Traction;Ultrasound;DME Instruction;Gait training;Stair training;Functional mobility training;Therapeutic activities;Therapeutic exercise;Balance training;Neuromuscular re-education;Patient/family education;Manual techniques;Scar mobilization;Passive range of motion;Dry needling;Energy conservation;Taping    PT Next Visit Plan  continue strengthening, stretching, balance, gait training, and functional strengthening within posterior hip precautions; cotninue mini squats and lunging, continue step ups, and progress as able.    PT Home Exercise Plan  5/17: ankle pumps,  HS sets, heel slides, supine abd, glute sets, quad sets (continuation from acute care exercises provided at d/c); 5/23: bridgine, SAQ, supine clams    Consulted and Agree with Plan of Care  Patient       Patient will benefit from skilled therapeutic intervention in order to improve the following deficits and impairments:  Abnormal gait, Decreased activity tolerance, Decreased balance, Decreased range of motion, Decreased strength, Difficulty walking, Hypomobility, Improper body mechanics, Pain, Increased muscle spasms, Increased fascial restricitons  Visit Diagnosis: Pain in right hip  Difficulty in walking, not elsewhere classified  Muscle weakness (generalized)  Other symptoms and signs involving the musculoskeletal system     Problem List Patient Active Problem List   Diagnosis Date Noted  . Hip pain, acute, right 11/17/2017  . Special screening for malignant neoplasms, colon         Jacob Arellano  PT, DPT  [facility] [address] [place], [place], [address] Phone: [phone]   Fax:  [phone]  Name: Jacob Arellano MRN: [id number] Date of Birth: 1956-03-05

## 2018-01-15 DIAGNOSIS — M6281 Muscle weakness (generalized): Secondary | ICD-10-CM | POA: Diagnosis present

## 2018-01-15 DIAGNOSIS — R29898 Other symptoms and signs involving the musculoskeletal system: Secondary | ICD-10-CM | POA: Diagnosis present

## 2018-01-15 DIAGNOSIS — M25551 Pain in right hip: Secondary | ICD-10-CM | POA: Diagnosis present

## 2018-01-15 DIAGNOSIS — R262 Difficulty in walking, not elsewhere classified: Secondary | ICD-10-CM | POA: Diagnosis present

## 2018-01-19 ENCOUNTER — Ambulatory Visit (HOSPITAL_COMMUNITY): Payer: BLUE CROSS/BLUE SHIELD

## 2018-01-19 DIAGNOSIS — R29898 Other symptoms and signs involving the musculoskeletal system: Secondary | ICD-10-CM | POA: Diagnosis not present

## 2018-01-19 DIAGNOSIS — M6281 Muscle weakness (generalized): Secondary | ICD-10-CM | POA: Diagnosis not present

## 2018-01-19 DIAGNOSIS — R262 Difficulty in walking, not elsewhere classified: Secondary | ICD-10-CM

## 2018-01-19 DIAGNOSIS — M25551 Pain in right hip: Secondary | ICD-10-CM | POA: Diagnosis not present

## 2018-01-19 NOTE — Therapy (Signed)
[place], [place], [address] Phone: [phone]   Fax:  [phone]  Physical Therapy Treatment  Patient Details  Name: Jacob Arellano MRN: [id number] Date of Birth: 1956/05/20 Referring Provider: Penelope Coop, MD   Encounter Date: 01/19/2018  PT End of Session - 01/19/18 1124    Visit Number  5    Number of Visits  13    Date for PT Re-Evaluation  02/19/18 mini reassess 01/29/18    Authorization Type  [employer] Other    Authorization Time Period  12/29/17 to 02/19/18 (POC is 6 weeks after f/u visit s/p R THA on 01/05/18)    Authorization - Visit Number  5    Authorization - Number of Visits  30    PT Start Time  1119    PT Stop Time  1200    PT Time Calculation (min)  41 min    Activity Tolerance  Patient tolerated treatment well    Behavior During Therapy  [facility] for tasks assessed/performed       Past Medical History:  Diagnosis Date  . Depression   . Hypercholesteremia   . Hypertension   . Sleep apnea     Past Surgical History:  Procedure Laterality Date  . BACK SURGERY    . broken nose    . CATARACT EXTRACTION W/PHACO Right 11/01/2012   Procedure: CATARACT EXTRACTION PHACO AND INTRAOCULAR LENS PLACEMENT (IOC);  Surgeon: Tonny Branch, MD;  Location: [facility];  Service: Ophthalmology;  Laterality: Right;  CDE=7.50  . CATARACT EXTRACTION W/PHACO Left 11/15/2012   Procedure: CATARACT EXTRACTION PHACO AND INTRAOCULAR LENS PLACEMENT (IOC);  Surgeon: Tonny Branch, MD;  Location: [facility];  Service: Ophthalmology;  Laterality: Left;  CDE 6.68  . COLONOSCOPY N/A 10/31/2016   Procedure: COLONOSCOPY;  Surgeon: Danie Binder, MD;  Location: [facility];  Service: Endoscopy;  Laterality: N/A;  11:45 pm  . GANGLION CYST EXCISION     Bilateral hands  . left bicep repair    . NASAL SEPTUM SURGERY    . ROTATOR CUFF REPAIR     right  . TONSILLECTOMY    . VITRECTOMY      There were no vitals filed for this  visit.  Subjective Assessment - 01/19/18 1124    Subjective  Pt states that his hip is feeling good. He reports walking about 1/3 of a mile yesterday. It was slow but went well.     Limitations  Walking    How long can you sit comfortably?  about 30-45 mins    How long can you stand comfortably?  5 mins    How long can you walk comfortably?  none, every step hurts and worsens every day    Patient Stated Goals  normal gait and no pain    Currently in Pain?  No/denies    Pain Onset  1 to 4 weeks ago           [facility] Adult PT Treatment/Exercise - 01/19/18 0001      Knee/Hip Exercises: Stretches   Passive Hamstring Stretch  Right;2 reps;30 seconds    Passive Hamstring Stretch Limitations  supine with rope    Quad Stretch  Right;3 reps;30 seconds;Limitations    Sports administrator Limitations  prone with rope    Other Knee/Hip Stretches  manual hip add stretch in supine 2x/30"      Knee/Hip Exercises: Standing   Forward Lunges  Right;20 reps;Limitations  Forward Lunges Limitations  on 6" step    Lateral Step Up  Right;20 reps;Hand Hold: 2;Step Height: 6"    Forward Step Up  Right;20 reps;Hand Hold: 2;Step Height: 6"    Step Down  Right;20 reps;Hand Hold: 2;Step Height: 6"    Functional Squat  20 reps    Functional Squat Limitations  BUE support on // bars    Rocker Board  2 minutes;Limitations    Rocker Board Limitations  R/L, no UE    SLS  RLE on foam, 3x30"    SLS with Vectors  RLE on foam, x5RT    Other Standing Knee Exercises  sidestepping RTB 89ft x3RT      Knee/Hip Exercises: Sidelying   Clams  2x10 reps, RLE only      Manual Therapy   Manual Therapy  Soft tissue mobilization    Manual therapy comments  completed separate rest of treatment    Soft tissue mobilization  STM along scar to decrease adhesions and decrease pain          PT Education - 01/19/18 1201    Education provided  Yes    Education Details  exercise techique, continue HEP    Person(s) Educated   Patient    Methods  Explanation;Demonstration    Comprehension  Verbalized understanding;Returned demonstration       PT Short Term Goals - 01/08/18 0825      PT SHORT TERM GOAL #1   Title  Pt will be independent with HEP and perform consistently in order to decrease pain and improve overall function.    Time  3    Period  Weeks    Status  Achieved      PT SHORT TERM GOAL #2   Title  Pt will have improved R hip AROM by 5 deg throughout in order to decrease pain and maximize overall function.    Baseline  5/17: did not assess this date d/t hip precautions    Time  3    Period  Weeks    Status  On-going      PT SHORT TERM GOAL #3   Title  Pt will be able to perform R SLS for at least 20 sec to be symmetrical with LLE in order to maximize gait on uneven ground and demo improved overall strength.    Baseline  5/17: 0 sec on R, 11 sec on L    Time  3    Period  Weeks    Status  On-going      PT SHORT TERM GOAL #4   Title  Pt will report being able to sleep through the night without awakening due to pain at 3x/week or > to maximzie recovery.     Baseline  5/17: has improved since the surgery but states it could be due to pain meds    Time  3    Period  Weeks    Status  On-going        PT Long Term Goals - 01/08/18 0827      PT LONG TERM GOAL #1   Title  Pt will have improved R hip flexion, IR, and ER AROM by 10 deg or > in order to further decrease pain and improved overall gait and functional tasks.    Baseline  5/17: did not assess this date d/t hip precautions    Time  6    Period  Weeks    Status  On-going  PT LONG TERM GOAL #2   Title  Pt will have improved R hip MMT by 1 grade throughout in order to decrease pain and improve gait, balance, and overall function.    Time  6    Period  Weeks    Status  On-going      PT LONG TERM GOAL #3   Title  Pt will have improved 30 sec chair rise test to at least 15x or > in order to demo improved balance and functional BLE  strength.     Baseline  5/17: 12, decreased weight shift over RLE    Time  6    Period  Weeks    Status  On-going      PT LONG TERM GOAL #4   Title  Pt will have improved 3MWT by 111ft or > with no AD, min to no gait deviations, and no increases in pain to demo improved functional strength and promote community access.     Baseline  5/17: 335ft with RW    Time  6    Period  Weeks    Status  On-going            Plan - 01/19/18 1201    Clinical Impression Statement  Pt presents to therapy with continued reports of decreased pain and improved function. Session continued to focus on BLE and functional strengthening, all within precautions. Able to progress step height for step ups, step downs, and lunging this date. He was able to perform R SLS on foam without UE support this date, an improvement from last session. Began scar mobilization/STM along scar to decrease adhesions and decrease incisional pain. Pt reported feeling great at EOS. Continue as planned, progressing as able    Rehab Potential  Good    PT Frequency  2x / week    PT Duration  6 weeks    PT Treatment/Interventions  ADLs/Self Care Home Management;Cryotherapy;Electrical Stimulation;Moist Heat;Traction;Ultrasound;DME Instruction;Gait training;Stair training;Functional mobility training;Therapeutic activities;Therapeutic exercise;Balance training;Neuromuscular re-education;Patient/family education;Manual techniques;Scar mobilization;Passive range of motion;Dry needling;Energy conservation;Taping    PT Next Visit Plan  continue strengthening, stretching, balance, gait training, and functional strengthening within posterior hip precautions; cotninue mini squats and lunging, continue step ups, and progress as able. continue scar mobilization    PT Home Exercise Plan  5/17: ankle pumps, HS sets, heel slides, supine abd, glute sets, quad sets (continuation from acute care exercises provided at d/c); 5/23: bridgine, SAQ, supine clams     Consulted and Agree with Plan of Care  Patient       Patient will benefit from skilled therapeutic intervention in order to improve the following deficits and impairments:  Abnormal gait, Decreased activity tolerance, Decreased balance, Decreased range of motion, Decreased strength, Difficulty walking, Hypomobility, Improper body mechanics, Pain, Increased muscle spasms, Increased fascial restricitons  Visit Diagnosis: Pain in right hip  Difficulty in walking, not elsewhere classified  Muscle weakness (generalized)  Other symptoms and signs involving the musculoskeletal system     Problem List Patient Active Problem List   Diagnosis Date Noted  . Hip pain, acute, right 11/17/2017  . Special screening for malignant neoplasms, colon        Geraldine Solar PT, DPT  [facility] [address] [place], [place], [address] Phone: [phone]   Fax:  [phone]  Name: LAYMAN GULLY MRN: [id number] Date of Birth: Jul 25, 1956

## 2018-01-21 ENCOUNTER — Encounter (HOSPITAL_COMMUNITY): Payer: Self-pay

## 2018-01-21 ENCOUNTER — Ambulatory Visit (HOSPITAL_COMMUNITY): Payer: BLUE CROSS/BLUE SHIELD

## 2018-01-21 DIAGNOSIS — R262 Difficulty in walking, not elsewhere classified: Secondary | ICD-10-CM | POA: Diagnosis not present

## 2018-01-21 DIAGNOSIS — M25551 Pain in right hip: Secondary | ICD-10-CM

## 2018-01-21 DIAGNOSIS — R29898 Other symptoms and signs involving the musculoskeletal system: Secondary | ICD-10-CM | POA: Diagnosis not present

## 2018-01-21 DIAGNOSIS — M6281 Muscle weakness (generalized): Secondary | ICD-10-CM

## 2018-01-21 NOTE — Therapy (Signed)
[place], [place], [address] Phone: [phone]   Fax:  [phone]  Physical Therapy Treatment  Patient Details  Name: Jacob Arellano MRN: [id number] Date of Birth: 10-10-1955 Referring Provider: Penelope Coop, MD   Encounter Date: 01/21/2018  PT End of Session - 01/21/18 1311    Visit Number  6    Number of Visits  13    Date for PT Re-Evaluation  02/19/18 mini-reassess 01/29/18    Authorization Type  BCBS Other    Authorization Time Period  12/29/17 to 02/19/18 (POC is 6 weeks after f/u visit s/p R THA on 01/05/18)    Authorization - Visit Number  6    Authorization - Number of Visits  30    PT Start Time  5277    PT Stop Time  1207    PT Time Calculation (min)  43 min    Activity Tolerance  Patient tolerated treatment well    Behavior During Therapy  [facility] for tasks assessed/performed       Past Medical History:  Diagnosis Date  . Depression   . Hypercholesteremia   . Hypertension   . Sleep apnea     Past Surgical History:  Procedure Laterality Date  . BACK SURGERY    . broken nose    . CATARACT EXTRACTION W/PHACO Right 11/01/2012   Procedure: CATARACT EXTRACTION PHACO AND INTRAOCULAR LENS PLACEMENT (IOC);  Surgeon: Tonny Branch, MD;  Location: [facility];  Service: Ophthalmology;  Laterality: Right;  CDE=7.50  . CATARACT EXTRACTION W/PHACO Left 11/15/2012   Procedure: CATARACT EXTRACTION PHACO AND INTRAOCULAR LENS PLACEMENT (IOC);  Surgeon: Tonny Branch, MD;  Location: [facility];  Service: Ophthalmology;  Laterality: Left;  CDE 6.68  . COLONOSCOPY N/A 10/31/2016   Procedure: COLONOSCOPY;  Surgeon: Danie Binder, MD;  Location: [facility];  Service: Endoscopy;  Laterality: N/A;  11:45 pm  . GANGLION CYST EXCISION     Bilateral hands  . left bicep repair    . NASAL SEPTUM SURGERY    . ROTATOR CUFF REPAIR     right  . TONSILLECTOMY    . VITRECTOMY      There were no vitals filed for this  visit.  Subjective Assessment - 01/21/18 1125    Subjective  Pt stated hip is feeling good.  Reports he did some yard work yesterday for about an hour and slept on Rt hip for first time, removal of stockings, began driving this weekend and able to shower now.  MD stated he feels pt is ahead of schedule.  No reports of pain in hip, some tenderness on incision.    Patient Stated Goals  normal gait and no pain    Currently in Pain?  Yes    Pain Score  1     Pain Location  Hip incision    Pain Orientation  Right    Pain Descriptors / Indicators  Sore    Pain Type  Surgical pain    Pain Onset  1 to 4 weeks ago    Pain Frequency  Constant    Aggravating Factors   any WB activity    Pain Relieving Factors  rest    Effect of Pain on Daily Activities  increase         [facility] PT Assessment - 01/21/18 0001      Assessment   Medical Diagnosis  Primary OA of R hip    Referring Provider  Penelope Coop, MD    Onset Date/Surgical Date  01/05/18    Next MD Visit  02/18/2018    Prior Therapy  OT for shoulder; no PT      Precautions   Precautions  Posterior Hip    Type of Shoulder Precautions  progress as tolerated    Precaution Booklet Issued  Yes (comment)                   [facility] Adult PT Treatment/Exercise - 01/21/18 0001      Knee/Hip Exercises: Standing   Forward Lunges  Right;20 reps;Limitations    Forward Lunges Limitations  on 6" step    Lateral Step Up  Right;20 reps;Hand Hold: 2;Step Height: 6"    Forward Step Up  Right;20 reps;Hand Hold: 2;Step Height: 6"    Step Down  Right;20 reps;Hand Hold: 2;Step Height: 6"    Functional Squat  20 reps    Functional Squat Limitations  BUE support on // bars    Rocker Board  2 minutes;Limitations    Rocker Board Limitations  R/L, no UE; Df/PF 1 HHA    SLS with Vectors  RLE on foam, x5RT    Other Standing Knee Exercises  sidestepping RTB 65ft x3RT      Knee/Hip Exercises: Supine   Other Supine Knee/Hip Exercises  supine  clams RTB 2x15      Knee/Hip Exercises: Sidelying   Hip ABduction  Right;10 reps      Manual Therapy   Manual Therapy  Myofascial release    Manual therapy comments  completed separate rest of treatment    Myofascial Release  STM along scar to decrease adhesions and decrease pain               PT Short Term Goals - 01/08/18 0825      PT SHORT TERM GOAL #1   Title  Pt will be independent with HEP and perform consistently in order to decrease pain and improve overall function.    Time  3    Period  Weeks    Status  Achieved      PT SHORT TERM GOAL #2   Title  Pt will have improved R hip AROM by 5 deg throughout in order to decrease pain and maximize overall function.    Baseline  5/17: did not assess this date d/t hip precautions    Time  3    Period  Weeks    Status  On-going      PT SHORT TERM GOAL #3   Title  Pt will be able to perform R SLS for at least 20 sec to be symmetrical with LLE in order to maximize gait on uneven ground and demo improved overall strength.    Baseline  5/17: 0 sec on R, 11 sec on L    Time  3    Period  Weeks    Status  On-going      PT SHORT TERM GOAL #4   Title  Pt will report being able to sleep through the night without awakening due to pain at 3x/week or > to maximzie recovery.     Baseline  5/17: has improved since the surgery but states it could be due to pain meds    Time  3    Period  Weeks    Status  On-going        PT Long Term Goals - 01/08/18 6712      PT  LONG TERM GOAL #1   Title  Pt will have improved R hip flexion, IR, and ER AROM by 10 deg or > in order to further decrease pain and improved overall gait and functional tasks.    Baseline  5/17: did not assess this date d/t hip precautions    Time  6    Period  Weeks    Status  On-going      PT LONG TERM GOAL #2   Title  Pt will have improved R hip MMT by 1 grade throughout in order to decrease pain and improve gait, balance, and overall function.    Time  6     Period  Weeks    Status  On-going      PT LONG TERM GOAL #3   Title  Pt will have improved 30 sec chair rise test to at least 15x or > in order to demo improved balance and functional BLE strength.     Baseline  5/17: 12, decreased weight shift over RLE    Time  6    Period  Weeks    Status  On-going      PT LONG TERM GOAL #4   Title  Pt will have improved 3MWT by 122ft or > with no AD, min to no gait deviations, and no increases in pain to demo improved functional strength and promote community access.     Baseline  5/17: 343ft with RW    Time  6    Period  Weeks    Status  On-going            Plan - 01/21/18 1311    Clinical Impression Statement  Pt arrived in good spirits, reports has been ambulating some with AD, MD removed stockings, allows ability to shower and drive short distances, big smile on pt.'s face upon entrance.  Pt able to verbalize appropriate posterior hip precautions with no assistance required.  Pt presents with minimal trendelenburg gait mechanics due to gluteal weakness.  Began session with rockerboard to improve stance phase with gait.  Session focus on Bil LE functional strengthening.  Min cueing to reduce compensation with step training for increased Rt LE strengthening and less HHA and push-off from [place].  No reports of pain through session, was limited by fatigue.  EOS with scar mobilizaiotn to address adhesions across incision.      Rehab Potential  Good    PT Frequency  2x / week    PT Duration  6 weeks    PT Treatment/Interventions  ADLs/Self Care Home Management;Cryotherapy;Electrical Stimulation;Moist Heat;Traction;Ultrasound;DME Instruction;Gait training;Stair training;Functional mobility training;Therapeutic activities;Therapeutic exercise;Balance training;Neuromuscular re-education;Patient/family education;Manual techniques;Scar mobilization;Passive range of motion;Dry needling;Energy conservation;Taping    PT Next Visit Plan  continue strengthening,  stretching, balance, gait training, and functional strengthening within posterior hip precautions; cotninue mini squats and lunging, continue step ups, and progress as able. continue scar mobilization    PT Home Exercise Plan  5/17: ankle pumps, HS sets, heel slides, supine abd, glute sets, quad sets (continuation from acute care exercises provided at d/c); 5/23: bridgine, SAQ, supine clams       Patient will benefit from skilled therapeutic intervention in order to improve the following deficits and impairments:  Abnormal gait, Decreased activity tolerance, Decreased balance, Decreased range of motion, Decreased strength, Difficulty walking, Hypomobility, Improper body mechanics, Pain, Increased muscle spasms, Increased fascial restricitons  Visit Diagnosis: Pain in right hip  Difficulty in walking, not elsewhere classified  Muscle weakness (generalized)  Other symptoms and signs involving the musculoskeletal system     Problem List Patient Active Problem List   Diagnosis Date Noted  . Hip pain, acute, right 11/17/2017  . Special screening for malignant neoplasms, colon    Ihor Austin, LPTA; CBIS [id number]  Aldona Lento 01/21/2018, 1:23 PM  [place] [address] [place], [place], [address] Phone: [phone]   Fax:  [phone]  Name: Jacob Arellano MRN: [id number] Date of Birth: September 14, 1955

## 2018-01-26 ENCOUNTER — Ambulatory Visit (HOSPITAL_COMMUNITY): Payer: BLUE CROSS/BLUE SHIELD | Attending: Adult Reconstructive Orthopaedic Surgery

## 2018-01-26 DIAGNOSIS — M25551 Pain in right hip: Secondary | ICD-10-CM

## 2018-01-26 DIAGNOSIS — R262 Difficulty in walking, not elsewhere classified: Secondary | ICD-10-CM | POA: Insufficient documentation

## 2018-01-26 DIAGNOSIS — R29898 Other symptoms and signs involving the musculoskeletal system: Secondary | ICD-10-CM | POA: Insufficient documentation

## 2018-01-26 DIAGNOSIS — M6281 Muscle weakness (generalized): Secondary | ICD-10-CM | POA: Insufficient documentation

## 2018-01-26 NOTE — Therapy (Signed)
[place], [place], [address] Phone: [phone]   Fax:  [phone]  Physical Therapy Treatment  Patient Details  Name: Jacob Arellano MRN: [id number] Date of Birth: 07-Jun-1956 Referring Provider: Penelope Coop, MD   Encounter Date: 01/26/2018  PT End of Session - 01/26/18 1039    Visit Number  7    Number of Visits  13    Date for PT Re-Evaluation  02/19/18 mini-reassess 01/29/18    Authorization Type  BCBS Other    Authorization Time Period  12/29/17 to 02/19/18 (POC is 6 weeks after f/u visit s/p R THA on 01/05/18)    Authorization - Visit Number  7    Authorization - Number of Visits  30    PT Start Time  1036    PT Stop Time  1117    PT Time Calculation (min)  41 min    Activity Tolerance  Patient tolerated treatment well    Behavior During Therapy  [facility] for tasks assessed/performed       Past Medical History:  Diagnosis Date  . Depression   . Hypercholesteremia   . Hypertension   . Sleep apnea     Past Surgical History:  Procedure Laterality Date  . BACK SURGERY    . broken nose    . CATARACT EXTRACTION W/PHACO Right 11/01/2012   Procedure: CATARACT EXTRACTION PHACO AND INTRAOCULAR LENS PLACEMENT (IOC);  Surgeon: Tonny Branch, MD;  Location: [facility];  Service: Ophthalmology;  Laterality: Right;  CDE=7.50  . CATARACT EXTRACTION W/PHACO Left 11/15/2012   Procedure: CATARACT EXTRACTION PHACO AND INTRAOCULAR LENS PLACEMENT (IOC);  Surgeon: Tonny Branch, MD;  Location: [facility];  Service: Ophthalmology;  Laterality: Left;  CDE 6.68  . COLONOSCOPY N/A 10/31/2016   Procedure: COLONOSCOPY;  Surgeon: Danie Binder, MD;  Location: [facility];  Service: Endoscopy;  Laterality: N/A;  11:45 pm  . GANGLION CYST EXCISION     Bilateral hands  . left bicep repair    . NASAL SEPTUM SURGERY    . ROTATOR CUFF REPAIR     right  . TONSILLECTOMY    . VITRECTOMY      There were no vitals filed for this  visit.  Subjective Assessment - 01/26/18 1039    Subjective  Pt stated that he had a good weekend. He is ready to be able to physcially do all that he used to be able to but has to keep reminding himself he is only 3 weeks out.    Patient Stated Goals  normal gait and no pain    Currently in Pain?  No/denies    Pain Score  --    Pain Location  --    Pain Orientation  --    Pain Descriptors / Indicators  --    Pain Type  --    Pain Onset  --    Pain Frequency  --    Aggravating Factors   --    Pain Relieving Factors  --    Effect of Pain on Daily Activities  --            [facility] Adult PT Treatment/Exercise - 01/26/18 0001      Knee/Hip Exercises: Stretches   Passive Hamstring Stretch  Right;2 reps;30 seconds    Passive Hamstring Stretch Limitations  supine with rope    Quad Stretch  Right;2 reps;30 seconds    Quad Stretch Limitations  prone with rope  Other Knee/Hip Stretches  R hip add/groin stretch 2x30"      Knee/Hip Exercises: Standing   Heel Raises  Both;15 reps;Limitations    Heel Raises Limitations  heel and toe on incline    Forward Lunges  Right;20 reps;Limitations    Forward Lunges Limitations  on bosu, dome up    Hip Abduction  Both;2 sets;10 reps    Abduction Limitations  GTB    Lateral Step Up  Right;20 reps;Hand Hold: 0;Step Height: 8"    Forward Step Up  Right;20 reps;Hand Hold: 0;Step Height: 8"    Step Down  Right;20 reps;Hand Hold: 0;Step Height: 8"    Functional Squat  10 reps    Functional Squat Limitations  10" holds    Other Standing Knee Exercises  R heel taps on 4" step x20 reps      Knee/Hip Exercises: Supine   Single Leg Bridge  Right;2 sets;10 reps      Manual Therapy   Manual Therapy  Myofascial release    Manual therapy comments  completed separate rest of treatment    Myofascial Release  STM along scar to decrease adhesions and decrease pain            PT Education - 01/26/18 1039    Education provided  Yes    Education  Details  exercise technique, continue HEP    Person(s) Educated  Patient    Methods  Explanation;Demonstration    Comprehension  Verbalized understanding;Returned demonstration       PT Short Term Goals - 01/08/18 0825      PT SHORT TERM GOAL #1   Title  Pt will be independent with HEP and perform consistently in order to decrease pain and improve overall function.    Time  3    Period  Weeks    Status  Achieved      PT SHORT TERM GOAL #2   Title  Pt will have improved R hip AROM by 5 deg throughout in order to decrease pain and maximize overall function.    Baseline  5/17: did not assess this date d/t hip precautions    Time  3    Period  Weeks    Status  On-going      PT SHORT TERM GOAL #3   Title  Pt will be able to perform R SLS for at least 20 sec to be symmetrical with LLE in order to maximize gait on uneven ground and demo improved overall strength.    Baseline  5/17: 0 sec on R, 11 sec on L    Time  3    Period  Weeks    Status  On-going      PT SHORT TERM GOAL #4   Title  Pt will report being able to sleep through the night without awakening due to pain at 3x/week or > to maximzie recovery.     Baseline  5/17: has improved since the surgery but states it could be due to pain meds    Time  3    Period  Weeks    Status  On-going        PT Long Term Goals - 01/08/18 0827      PT LONG TERM GOAL #1   Title  Pt will have improved R hip flexion, IR, and ER AROM by 10 deg or > in order to further decrease pain and improved overall gait and functional tasks.    Baseline  5/17: did  not assess this date d/t hip precautions    Time  6    Period  Weeks    Status  On-going      PT LONG TERM GOAL #2   Title  Pt will have improved R hip MMT by 1 grade throughout in order to decrease pain and improve gait, balance, and overall function.    Time  6    Period  Weeks    Status  On-going      PT LONG TERM GOAL #3   Title  Pt will have improved 30 sec chair rise test to at  least 15x or > in order to demo improved balance and functional BLE strength.     Baseline  5/17: 12, decreased weight shift over RLE    Time  6    Period  Weeks    Status  On-going      PT LONG TERM GOAL #4   Title  Pt will have improved 3MWT by 165ft or > with no AD, min to no gait deviations, and no increases in pain to demo improved functional strength and promote community access.     Baseline  5/17: 365ft with RW    Time  6    Period  Weeks    Status  On-going            Plan - 01/26/18 1117    Clinical Impression Statement  Continued with established POC focusing on improving overall strength and decreasing pain. Progressed pt's strengthening this date with good tolerance; min cues required for form.. Pt reporting intermittent compliance with HEP and stating that he always feels better when he comes to therapy and exercises; PT explained the importance of the HEP and strongly encouraged him to perform it on days he is not in therapy to help decrease pain and maximize overall function. Ended with MFR to pt's scar and it was noted to feel really great, with minimal adhesions noted. Pt needs continued skilled PT intervention to address remaining deficits in order to decrease pain and improve overall function.    Rehab Potential  Good    PT Frequency  2x / week    PT Duration  6 weeks    PT Treatment/Interventions  ADLs/Self Care Home Management;Cryotherapy;Electrical Stimulation;Moist Heat;Traction;Ultrasound;DME Instruction;Gait training;Stair training;Functional mobility training;Therapeutic activities;Therapeutic exercise;Balance training;Neuromuscular re-education;Patient/family education;Manual techniques;Scar mobilization;Passive range of motion;Dry needling;Energy conservation;Taping    PT Next Visit Plan  reassess next visit; continue strengthening, stretching, balance, gait training, and functional strengthening within posterior hip precautions; cotninue mini squats and lunging  on bosu, continue step ups, and progress as able. continue scar  PRN    PT Home Exercise Plan  5/17: ankle pumps, HS sets, heel slides, supine abd, glute sets, quad sets (continuation from acute care exercises provided at d/c); 5/23: bridgine, SAQ, supine clams    Consulted and Agree with Plan of Care  Patient       Patient will benefit from skilled therapeutic intervention in order to improve the following deficits and impairments:  Abnormal gait, Decreased activity tolerance, Decreased balance, Decreased range of motion, Decreased strength, Difficulty walking, Hypomobility, Improper body mechanics, Pain, Increased muscle spasms, Increased fascial restricitons  Visit Diagnosis: Pain in right hip  Difficulty in walking, not elsewhere classified  Muscle weakness (generalized)  Other symptoms and signs involving the musculoskeletal system     Problem List Patient Active Problem List   Diagnosis Date Noted  . Hip pain, acute, right 11/17/2017  . Special  screening for malignant neoplasms, colon        Geraldine Solar PT, DPT  [place] [address] [place], [place], [address] Phone: [phone]   Fax:  [phone]  Name: Jacob Arellano MRN: [id number] Date of Birth: 02/02/1956

## 2018-01-28 ENCOUNTER — Encounter (HOSPITAL_COMMUNITY): Payer: Self-pay

## 2018-01-28 ENCOUNTER — Ambulatory Visit (HOSPITAL_COMMUNITY): Payer: BLUE CROSS/BLUE SHIELD

## 2018-01-28 DIAGNOSIS — M25551 Pain in right hip: Secondary | ICD-10-CM

## 2018-01-28 DIAGNOSIS — R262 Difficulty in walking, not elsewhere classified: Secondary | ICD-10-CM | POA: Diagnosis not present

## 2018-01-28 DIAGNOSIS — R29898 Other symptoms and signs involving the musculoskeletal system: Secondary | ICD-10-CM

## 2018-01-28 DIAGNOSIS — M6281 Muscle weakness (generalized): Secondary | ICD-10-CM

## 2018-01-28 NOTE — Therapy (Signed)
[place], [place], [address] Phone: [phone]   Fax:  [phone]   Progress Note Reporting Period 01/08/18 to 01/28/18  See note below for Objective Data and Assessment of Progress/Goals.     Physical Therapy Treatment  Patient Details  Name: Jacob Arellano MRN: [id number] Date of Birth: [birth year] Referring Provider: Penelope Coop, MD   Encounter Date: 01/28/2018  PT End of Session - 01/28/18 1040    Visit Number  8    Number of Visits  13    Date for PT Re-Evaluation  02/19/18 mini-reassess completed 01/28/18    Authorization Type  [employer] Other    Authorization Time Period  12/29/17 to 02/19/18 (POC is 6 weeks after f/u visit s/p R THA on 01/05/18)    Authorization - Visit Number  8    Authorization - Number of Visits  30    PT Start Time  2355 pt arrived late    PT Stop Time  1115    PT Time Calculation (min)  35 min    Activity Tolerance  Patient tolerated treatment well    Behavior During Therapy  [facility] for tasks assessed/performed       Past Medical History:  Diagnosis Date  . Depression   . Hypercholesteremia   . Hypertension   . Sleep apnea     Past Surgical History:  Procedure Laterality Date  . BACK SURGERY    . broken nose    . CATARACT EXTRACTION W/PHACO Right 11/01/2012   Procedure: CATARACT EXTRACTION PHACO AND INTRAOCULAR LENS PLACEMENT (IOC);  Surgeon: Tonny Branch, MD;  Location: [facility];  Service: Ophthalmology;  Laterality: Right;  CDE=7.50  . CATARACT EXTRACTION W/PHACO Left 11/15/2012   Procedure: CATARACT EXTRACTION PHACO AND INTRAOCULAR LENS PLACEMENT (IOC);  Surgeon: Tonny Branch, MD;  Location: [facility];  Service: Ophthalmology;  Laterality: Left;  CDE 6.68  . COLONOSCOPY N/A 10/31/2016   Procedure: COLONOSCOPY;  Surgeon: Danie Binder, MD;  Location: [facility];  Service: Endoscopy;  Laterality: N/A;  11:45 pm  . GANGLION CYST EXCISION     Bilateral hands  . left bicep repair     . NASAL SEPTUM SURGERY    . ROTATOR CUFF REPAIR     right  . TONSILLECTOMY    . VITRECTOMY      There were no vitals filed for this visit.  Subjective Assessment - 01/28/18 1041    Subjective  Pt states that he is feeling good. He has not performed his HEP still.     Patient Stated Goals  normal gait and no pain    Currently in Pain?  No/denies         [facility] PT Assessment - 01/28/18 0001      Assessment   Medical Diagnosis  Primary OA of R hip    Referring Provider  Penelope Coop, MD    Onset Date/Surgical Date  01/05/18    Next MD Visit  02/18/2018    Prior Therapy  OT for shoulder; no PT      Precautions   Precautions  Posterior Hip    Type of Shoulder Precautions  progress as tolerated      Sit to Stand   Comments  30 sec chair rise test: 12 (decreased weight shift off RLE)      Strength   Right Hip Flexion  4+/5 was 3-    Right Hip Extension  3-/5 was 2+  Right Hip ABduction  3+/5 was 2+    Left Hip Extension  4-/5 was 4-    Left Hip ABduction  4/5 was 4    Right Knee Extension  5/5 was 4+      Ambulation/Gait   Ambulation Distance (Feet)  794 Feet was 324 with RW    Assistive device  Straight cane    Gait Pattern  Step-through pattern;Trendelenburg;Antalgic R toe IR      Balance   Balance Assessed  Yes      Static Standing Balance   Static Standing - Balance Support  No upper extremity supported    Static Standing Balance -  Activities   Single Leg Stance - Right Leg;Single Leg Stance - Left Leg    Static Standing - Comment/# of Minutes  R: 3.5sec, L: 11sec was 0 sec on RLE            [facility] Adult PT Treatment/Exercise - 01/28/18 0001      Knee/Hip Exercises: Stretches   Passive Hamstring Stretch  Right;2 reps;30 seconds    Passive Hamstring Stretch Limitations  supine with rope      Knee/Hip Exercises: Supine   Single Leg Bridge  Right;2 sets;15 reps      Knee/Hip Exercises: Sidelying   Hip ABduction  Right;2 sets;15 reps    Clams   unable to perform this date due to increaed groin pain with motion           PT Education - 01/28/18 1040    Education provided  Yes    Education Details  reassessment findings    Person(s) Educated  Patient    Methods  Explanation    Comprehension  Verbalized understanding       PT Short Term Goals - 01/28/18 1042      PT SHORT TERM GOAL #1   Title  Pt will be independent with HEP and perform consistently in order to decrease pain and improve overall function.    Baseline  6/6: reports non-compliance    Time  3    Period  Weeks    Status  On-going      PT SHORT TERM GOAL #2   Title  Pt will have improved R hip AROM by 5 deg throughout in order to decrease pain and maximize overall function.    Baseline  6/6: did not assess this date d/t hip precautions    Time  3    Period  Weeks    Status  On-going      PT SHORT TERM GOAL #3   Title  Pt will be able to perform R SLS for at least 20 sec to be symmetrical with LLE in order to maximize gait on uneven ground and demo improved overall strength.    Baseline  6/6: 3 sec on R, 11 sec on L    Time  3    Period  Weeks    Status  On-going      PT SHORT TERM GOAL #4   Title  Pt will report being able to sleep through the night without awakening due to pain at 3x/week or > to maximzie recovery.     Baseline  6/6: doesn't wake up due to pain, just due to discomfort when he tries to change positions, happens every night    Time  3    Period  Weeks    Status  On-going        PT Long Term Goals -  01/28/18 1044      PT LONG TERM GOAL #1   Title  Pt will have improved R hip flexion, IR, and ER AROM by 10 deg or > in order to further decrease pain and improved overall gait and functional tasks.    Baseline  6/6: did not assess this date d/t hip precautions    Time  6    Period  Weeks    Status  On-going      PT LONG TERM GOAL #2   Title  Pt will have improved R hip MMT by 1 grade throughout in order to decrease pain and  improve gait, balance, and overall function.    Baseline  6/6: see MMT    Time  6    Period  Weeks    Status  Partially Met      PT LONG TERM GOAL #3   Title  Pt will have improved 30 sec chair rise test to at least 15x or > in order to demo improved balance and functional BLE strength.     Baseline  6/6: 12, decreased weight shift over RLE    Time  6    Period  Weeks    Status  On-going      PT LONG TERM GOAL #4   Title  Pt will have improved 3MWT by 18f or > with no AD, min to no gait deviations, and no increases in pain to demo improved functional strength and promote community access.     Baseline  5/17: 3258fwith RW    Time  6    Period  Weeks    Status  On-going            Plan - 01/28/18 1115    Clinical Impression Statement  Session limited as pt arrived late. PT reassessed pt's goals and outcome measures this date. Pt has made great progress towards goals thus far. His MMT, balance, gait, and functional strength have all improved since his surgery. He was only able to perform R SLS for 3 sec or <, but following his surgery he was not able to perform this at all. His gait mechanics with SPC are steadily improving; he still demo's Trendelenberg sign and mild antalgia but overall, much improved. Rest of session focused on gait without AD and hip strengthening. Continue as planned, progressing as able.     Rehab Potential  Good    PT Frequency  2x / week    PT Duration  6 weeks    PT Treatment/Interventions  ADLs/Self Care Home Management;Cryotherapy;Electrical Stimulation;Moist Heat;Traction;Ultrasound;DME Instruction;Gait training;Stair training;Functional mobility training;Therapeutic activities;Therapeutic exercise;Balance training;Neuromuscular re-education;Patient/family education;Manual techniques;Scar mobilization;Passive range of motion;Dry needling;Energy conservation;Taping    PT Next Visit Plan  continue strengthening, stretching, balance, gait training, and  functional strengthening within posterior hip precautions; cotninue mini squats and lunging on bosu, continue step ups, and progress as able. continue scar  PRN    PT Home Exercise Plan  5/17: ankle pumps, HS sets, heel slides, supine abd, glute sets, quad sets (continuation from acute care exercises provided at d/c); 5/23: bridgine, SAQ, supine clams    Consulted and Agree with Plan of Care  Patient       Patient will benefit from skilled therapeutic intervention in order to improve the following deficits and impairments:  Abnormal gait, Decreased activity tolerance, Decreased balance, Decreased range of motion, Decreased strength, Difficulty walking, Hypomobility, Improper body mechanics, Pain, Increased muscle spasms, Increased fascial restricitons  Visit Diagnosis: Pain  in right hip  Difficulty in walking, not elsewhere classified  Muscle weakness (generalized)  Other symptoms and signs involving the musculoskeletal system     Problem List Patient Active Problem List   Diagnosis Date Noted  . Hip pain, acute, right 11/17/2017  . Special screening for malignant neoplasms, colon        Geraldine Solar PT, DPT  [place] [address] [place], [place], [address] Phone: [phone]   Fax:  [phone]  Name: CORRIE BRANNEN MRN: [id number] Date of Birth: 07-25-1956

## 2018-02-02 ENCOUNTER — Ambulatory Visit (HOSPITAL_COMMUNITY): Payer: BLUE CROSS/BLUE SHIELD

## 2018-02-02 DIAGNOSIS — M25551 Pain in right hip: Secondary | ICD-10-CM | POA: Diagnosis not present

## 2018-02-02 DIAGNOSIS — M6281 Muscle weakness (generalized): Secondary | ICD-10-CM

## 2018-02-02 DIAGNOSIS — R262 Difficulty in walking, not elsewhere classified: Secondary | ICD-10-CM | POA: Diagnosis not present

## 2018-02-02 DIAGNOSIS — R29898 Other symptoms and signs involving the musculoskeletal system: Secondary | ICD-10-CM

## 2018-02-02 NOTE — Therapy (Signed)
[place], [place], [address] Phone: [phone]   Fax:  [phone]  Physical Therapy Treatment  Patient Details  Name: Jacob Arellano MRN: [id number] Date of Birth: [birth year] Referring Provider: Penelope Coop, MD   Encounter Date: 02/02/2018  PT End of Session - 02/02/18 1035    Visit Number  9    Number of Visits  13    Date for PT Re-Evaluation  02/19/18 mini-reassess completed 01/28/18    Authorization Type  [employer] Other    Authorization Time Period  12/29/17 to 02/19/18 (POC is 6 weeks after f/u visit s/p R THA on 01/05/18)    Authorization - Visit Number  9    Authorization - Number of Visits  30    PT Start Time  8264    PT Stop Time  1112    PT Time Calculation (min)  40 min    Activity Tolerance  Patient tolerated treatment well    Behavior During Therapy  [facility] for tasks assessed/performed       Past Medical History:  Diagnosis Date  . Depression   . Hypercholesteremia   . Hypertension   . Sleep apnea     Past Surgical History:  Procedure Laterality Date  . BACK SURGERY    . broken nose    . CATARACT EXTRACTION W/PHACO Right 11/01/2012   Procedure: CATARACT EXTRACTION PHACO AND INTRAOCULAR LENS PLACEMENT (IOC);  Surgeon: Tonny Branch, MD;  Location: [facility];  Service: Ophthalmology;  Laterality: Right;  CDE=7.50  . CATARACT EXTRACTION W/PHACO Left 11/15/2012   Procedure: CATARACT EXTRACTION PHACO AND INTRAOCULAR LENS PLACEMENT (IOC);  Surgeon: Tonny Branch, MD;  Location: [facility];  Service: Ophthalmology;  Laterality: Left;  CDE 6.68  . COLONOSCOPY N/A 10/31/2016   Procedure: COLONOSCOPY;  Surgeon: Danie Binder, MD;  Location: [facility];  Service: Endoscopy;  Laterality: N/A;  11:45 pm  . GANGLION CYST EXCISION     Bilateral hands  . left bicep repair    . NASAL SEPTUM SURGERY    . ROTATOR CUFF REPAIR     right  . TONSILLECTOMY    . VITRECTOMY      There were no vitals filed for this  visit.  Subjective Assessment - 02/02/18 1036    Subjective  Pt states that he walked a mile yesterday and he is pretty sore today. He reports that his anterior thigh and hip flexors are sore.     Patient Stated Goals  normal gait and no pain    Currently in Pain?  Yes    Pain Score  5     Pain Location  Leg    Pain Orientation  Right;Upper;Anterior    Pain Descriptors / Indicators  Sore    Pain Type  Surgical pain    Pain Onset  1 to 4 weeks ago    Pain Frequency  Constant    Aggravating Factors   any WB activity    Pain Relieving Factors  rest    Effect of Pain on Daily Activities  increase             [facility] Adult PT Treatment/Exercise - 02/02/18 0001      Knee/Hip Exercises: Stretches   Passive Hamstring Stretch  Right;2 reps;30 seconds    Passive Hamstring Stretch Limitations  supine with rope    Quad Stretch  Right;2 reps;30 seconds    Quad Stretch Limitations  prone with rope  Other Knee/Hip Stretches  R hip add/groin stretch 2x30"      Knee/Hip Exercises: Machines for Strengthening   Total Gym Leg Press  34deg, BLE, x20 reps; 24deg RLE only, 2x10 reps      Knee/Hip Exercises: Standing   Forward Lunges  Right;20 reps;Limitations    Forward Lunges Limitations  on bosu, dome up    Lateral Step Up  Right;10 reps    Lateral Step Up Limitations  bosu, dome up    Forward Step Up  Right;10 reps    Forward Step Up Limitations  bosu, dome up    Functional Squat  10 reps    Functional Squat Limitations  on bosu, dome down    Wall Squat  2 sets;15 reps    Gait Training  x2 laps around gym with SPC focusing on decreasing hip IR      Knee/Hip Exercises: Supine   Single Leg Bridge  Right;2 sets;15 reps      Knee/Hip Exercises: Sidelying   Clams  2x10, RLE only, no resistance      Knee/Hip Exercises: Prone   Hip Extension  Right;2 sets;10 reps           PT Education - 02/02/18 1035    Education provided  Yes    Education Details  exercise technique     Person(s) Educated  Patient    Methods  Explanation;Demonstration    Comprehension  Verbalized understanding;Returned demonstration       PT Short Term Goals - 01/28/18 1042      PT SHORT TERM GOAL #1   Title  Pt will be independent with HEP and perform consistently in order to decrease pain and improve overall function.    Baseline  6/6: reports non-compliance    Time  3    Period  Weeks    Status  On-going      PT SHORT TERM GOAL #2   Title  Pt will have improved R hip AROM by 5 deg throughout in order to decrease pain and maximize overall function.    Baseline  6/6: did not assess this date d/t hip precautions    Time  3    Period  Weeks    Status  On-going      PT SHORT TERM GOAL #3   Title  Pt will be able to perform R SLS for at least 20 sec to be symmetrical with LLE in order to maximize gait on uneven ground and demo improved overall strength.    Baseline  6/6: 3 sec on R, 11 sec on L    Time  3    Period  Weeks    Status  On-going      PT SHORT TERM GOAL #4   Title  Pt will report being able to sleep through the night without awakening due to pain at 3x/week or > to maximzie recovery.     Baseline  6/6: doesn't wake up due to pain, just due to discomfort when he tries to change positions, happens every night    Time  3    Period  Weeks    Status  On-going        PT Long Term Goals - 01/28/18 1044      PT LONG TERM GOAL #1   Title  Pt will have improved R hip flexion, IR, and ER AROM by 10 deg or > in order to further decrease pain and improved overall gait and functional tasks.  Baseline  6/6: did not assess this date d/t hip precautions    Time  6    Period  Weeks    Status  On-going      PT LONG TERM GOAL #2   Title  Pt will have improved R hip MMT by 1 grade throughout in order to decrease pain and improve gait, balance, and overall function.    Baseline  6/6: see MMT    Time  6    Period  Weeks    Status  Partially Met      PT LONG TERM GOAL #3    Title  Pt will have improved 30 sec chair rise test to at least 15x or > in order to demo improved balance and functional BLE strength.     Baseline  6/6: 12, decreased weight shift over RLE    Time  6    Period  Weeks    Status  On-going      PT LONG TERM GOAL #4   Title  Pt will have improved 3MWT by 166f or > with no AD, min to no gait deviations, and no increases in pain to demo improved functional strength and promote community access.     Baseline  5/17: 3217fwith RW    Time  6    Period  Weeks    Status  On-going            Plan - 02/02/18 1113    Clinical Impression Statement  Pt presenting to therapy stating that he walked a mile yesterday and did a lot of cooking on Saturday and is not having a lot of increased anterior thigh and hip flexor soreness; PT explained that this is a normal DOMS response to his increased activity and educated him that as his endurance and activity tolerance increases, this should get better. He also reported he noticed his foot was turning in during his walking; PT has noticed this and educated pt that this is likely due to hip ER weakness but that we will continue to monitor it in future sessions. Focused on stretching and overall strengthening this date within precautions. Able to progress to step ups on bosu and leg press on the power tower, all with good tolerance. Min cues required for form. Continued to stress importance of performing HEP. No pain at EOS. Continue POC as planned, progressing as able.     Rehab Potential  Good    PT Frequency  2x / week    PT Duration  6 weeks    PT Treatment/Interventions  ADLs/Self Care Home Management;Cryotherapy;Electrical Stimulation;Moist Heat;Traction;Ultrasound;DME Instruction;Gait training;Stair training;Functional mobility training;Therapeutic activities;Therapeutic exercise;Balance training;Neuromuscular re-education;Patient/family education;Manual techniques;Scar mobilization;Passive range of  motion;Dry needling;Energy conservation;Taping    PT Next Visit Plan  continue strengthening, stretching, balance, gait training, and functional strengthening within posterior hip precautions; cotninue mini squats and lunging on bosu, begin SLS on bosu, continue scar PRN    PT Home Exercise Plan  5/17: ankle pumps, HS sets, heel slides, supine abd, glute sets, quad sets (continuation from acute care exercises provided at d/c); 5/23: bridgine, SAQ, supine clams    Consulted and Agree with Plan of Care  Patient       Patient will benefit from skilled therapeutic intervention in order to improve the following deficits and impairments:  Abnormal gait, Decreased activity tolerance, Decreased balance, Decreased range of motion, Decreased strength, Difficulty walking, Hypomobility, Improper body mechanics, Pain, Increased muscle spasms, Increased fascial restricitons  Visit Diagnosis:  Pain in right hip  Difficulty in walking, not elsewhere classified  Muscle weakness (generalized)  Other symptoms and signs involving the musculoskeletal system     Problem List Patient Active Problem List   Diagnosis Date Noted  . Hip pain, acute, right 11/17/2017  . Special screening for malignant neoplasms, colon        Geraldine Solar PT, DPT  [place] [address] [place], [place], [address] Phone: [phone]   Fax:  [phone]  Name: Jacob Arellano MRN: [id number] Date of Birth: August 16, 1956

## 2018-02-04 ENCOUNTER — Ambulatory Visit (HOSPITAL_COMMUNITY): Payer: BLUE CROSS/BLUE SHIELD

## 2018-02-04 ENCOUNTER — Encounter (HOSPITAL_COMMUNITY): Payer: Self-pay

## 2018-02-04 DIAGNOSIS — R29898 Other symptoms and signs involving the musculoskeletal system: Secondary | ICD-10-CM

## 2018-02-04 DIAGNOSIS — M25551 Pain in right hip: Secondary | ICD-10-CM

## 2018-02-04 DIAGNOSIS — M6281 Muscle weakness (generalized): Secondary | ICD-10-CM

## 2018-02-04 DIAGNOSIS — R262 Difficulty in walking, not elsewhere classified: Secondary | ICD-10-CM

## 2018-02-04 NOTE — Therapy (Signed)
[place], [place], [address] Phone: [phone]   Fax:  [phone]  Physical Therapy Treatment  Patient Details  Name: Jacob Arellano MRN: [id number] Date of Birth: 12-20-1955 Referring Provider: Penelope Coop, MD   Encounter Date: 02/04/2018  PT End of Session - 02/04/18 1108    Visit Number  10    Number of Visits  13    Date for PT Re-Evaluation  02/19/18 mini-reassess completed 01/28/18    Authorization Type  BCBS Other    Authorization Time Period  12/29/17 to 02/19/18 (POC is 6 weeks after f/u visit s/p R THA on 01/05/18)    Authorization - Visit Number  10    Authorization - Number of Visits  30    PT Start Time  3614    PT Stop Time  1120    PT Time Calculation (min)  45 min    Activity Tolerance  Patient tolerated treatment well    Behavior During Therapy  [facility] for tasks assessed/performed       Past Medical History:  Diagnosis Date  . Depression   . Hypercholesteremia   . Hypertension   . Sleep apnea     Past Surgical History:  Procedure Laterality Date  . BACK SURGERY    . broken nose    . CATARACT EXTRACTION W/PHACO Right 11/01/2012   Procedure: CATARACT EXTRACTION PHACO AND INTRAOCULAR LENS PLACEMENT (IOC);  Surgeon: Tonny Branch, MD;  Location: [facility];  Service: Ophthalmology;  Laterality: Right;  CDE=7.50  . CATARACT EXTRACTION W/PHACO Left 11/15/2012   Procedure: CATARACT EXTRACTION PHACO AND INTRAOCULAR LENS PLACEMENT (IOC);  Surgeon: Tonny Branch, MD;  Location: [facility];  Service: Ophthalmology;  Laterality: Left;  CDE 6.68  . COLONOSCOPY N/A 10/31/2016   Procedure: COLONOSCOPY;  Surgeon: Danie Binder, MD;  Location: [facility];  Service: Endoscopy;  Laterality: N/A;  11:45 pm  . GANGLION CYST EXCISION     Bilateral hands  . left bicep repair    . NASAL SEPTUM SURGERY    . ROTATOR CUFF REPAIR     right  . TONSILLECTOMY    . VITRECTOMY      There were no vitals filed for this  visit.  Subjective Assessment - 02/04/18 1034    Subjective  3/10 anterior hip pain at times. stiff when first gets up 5/10. Admits he still has not been doing his HEP.    Patient Stated Goals  normal gait and no pain    Pain Onset  1 to 4 weeks ago                       [facility] Adult PT Treatment/Exercise - 02/04/18 0001      Knee/Hip Exercises: Stretches   Passive Hamstring Stretch  Right;2 reps;30 seconds    Passive Hamstring Stretch Limitations  supine with rope    Quad Stretch  Right;2 reps;30 seconds    Quad Stretch Limitations  prone with rope    Other Knee/Hip Stretches  R hip add/groin stretch 2x30"      Knee/Hip Exercises: Machines for Strengthening   Total Gym Leg Press  34 deg BLE, no AD, 2 x20 reps      Knee/Hip Exercises: Standing   Forward Lunges  Right;20 reps;Limitations    Forward Lunges Limitations  on bosu, dome up    Lateral Step Up  Right;10 reps    Lateral Step Up Limitations  bosu, dome up    Forward Step Up  Right;10 reps    Forward Step Up Limitations  bosu, dome up    Functional Squat  10 reps    Functional Squat Limitations  on bosu, dome down    Wall Squat  2 sets;15 reps    SLS  RLE on BOSU 30" x2    Gait Training  x2 laps around gym without SPC focusing on decreasing hip IR      Knee/Hip Exercises: Supine   Single Leg Bridge  Right;2 sets;15 reps      Knee/Hip Exercises: Sidelying   Clams  2x10, RLE only, no resistance      Knee/Hip Exercises: Prone   Hip Extension  Right;2 sets;10 reps             PT Education - 02/04/18 1107    Education provided  Yes    Education Details  exercise technique, improtance of HEP compliance, gait mechanics    Person(s) Educated  Patient    Methods  Explanation;Demonstration    Comprehension  Verbalized understanding       PT Short Term Goals - 01/28/18 1042      PT SHORT TERM GOAL #1   Title  Pt will be independent with HEP and perform consistently in order to decrease pain and  improve overall function.    Baseline  6/6: reports non-compliance    Time  3    Period  Weeks    Status  On-going      PT SHORT TERM GOAL #2   Title  Pt will have improved R hip AROM by 5 deg throughout in order to decrease pain and maximize overall function.    Baseline  6/6: did not assess this date d/t hip precautions    Time  3    Period  Weeks    Status  On-going      PT SHORT TERM GOAL #3   Title  Pt will be able to perform R SLS for at least 20 sec to be symmetrical with LLE in order to maximize gait on uneven ground and demo improved overall strength.    Baseline  6/6: 3 sec on R, 11 sec on L    Time  3    Period  Weeks    Status  On-going      PT SHORT TERM GOAL #4   Title  Pt will report being able to sleep through the night without awakening due to pain at 3x/week or > to maximzie recovery.     Baseline  6/6: doesn't wake up due to pain, just due to discomfort when he tries to change positions, happens every night    Time  3    Period  Weeks    Status  On-going        PT Long Term Goals - 01/28/18 1044      PT LONG TERM GOAL #1   Title  Pt will have improved R hip flexion, IR, and ER AROM by 10 deg or > in order to further decrease pain and improved overall gait and functional tasks.    Baseline  6/6: did not assess this date d/t hip precautions    Time  6    Period  Weeks    Status  On-going      PT LONG TERM GOAL #2   Title  Pt will have improved R hip MMT by 1 grade throughout in order to decrease pain  and improve gait, balance, and overall function.    Baseline  6/6: see MMT    Time  6    Period  Weeks    Status  Partially Met      PT LONG TERM GOAL #3   Title  Pt will have improved 30 sec chair rise test to at least 15x or > in order to demo improved balance and functional BLE strength.     Baseline  6/6: 12, decreased weight shift over RLE    Time  6    Period  Weeks    Status  On-going      PT LONG TERM GOAL #4   Title  Pt will have improved  3MWT by 133f or > with no AD, min to no gait deviations, and no increases in pain to demo improved functional strength and promote community access.     Baseline  5/17: 3232fwith RW    Time  6    Period  Weeks    Status  On-going            Plan - 02/04/18 1108    Clinical Impression Statement  Patient continues to be non-compliant with HEP. PT discussed importance of HEP and focusing on correct gait mechanics. Patient performed well during therapy session without complaints of increased pain. Correction of hip IR during exercise performance. Patient able to increase to  squat on BOSU. SLS on BOSU added. Patient would continue to benefit from skilled OPPT to continue strengthening, balance, gait training and return to functional ability.    Rehab Potential  Good    PT Frequency  2x / week    PT Duration  6 weeks    PT Treatment/Interventions  ADLs/Self Care Home Management;Cryotherapy;Electrical Stimulation;Moist Heat;Traction;Ultrasound;DME Instruction;Gait training;Stair training;Functional mobility training;Therapeutic activities;Therapeutic exercise;Balance training;Neuromuscular re-education;Patient/family education;Manual techniques;Scar mobilization;Passive range of motion;Dry needling;Energy conservation;Taping    PT Next Visit Plan  continue strengthening, stretching, balance, gait training, and functional strengthening within posterior hip precautions; cotninue RLE SLS, 1/2 squats and lunging on bosu, continue scar PRN    PT Home Exercise Plan  5/17: ankle pumps, HS sets, heel slides, supine abd, glute sets, quad sets (continuation from acute care exercises provided at d/c); 5/23: bridgine, SAQ, supine clams    Consulted and Agree with Plan of Care  Patient       Patient will benefit from skilled therapeutic intervention in order to improve the following deficits and impairments:  Abnormal gait, Decreased activity tolerance, Decreased balance, Decreased range of motion,  Decreased strength, Difficulty walking, Hypomobility, Improper body mechanics, Pain, Increased muscle spasms, Increased fascial restricitons  Visit Diagnosis: Pain in right hip  Difficulty in walking, not elsewhere classified  Muscle weakness (generalized)  Other symptoms and signs involving the musculoskeletal system     Problem List Patient Active Problem List   Diagnosis Date Noted  . Hip pain, acute, right 11/17/2017  . Special screening for malignant neoplasms, colon     BaFloria RavelingHartnett-Rands, MS, PT Per [place]#66063/13/2019, 12:18 PM  [place]t[place]hone: [phone] Fax:  [phone]: Jacob ALBRIGHTRN: [id number] of Birth: 09/1955-10-23

## 2018-02-09 ENCOUNTER — Ambulatory Visit (HOSPITAL_COMMUNITY): Payer: BLUE CROSS/BLUE SHIELD

## 2018-02-09 DIAGNOSIS — M6281 Muscle weakness (generalized): Secondary | ICD-10-CM

## 2018-02-09 DIAGNOSIS — R29898 Other symptoms and signs involving the musculoskeletal system: Secondary | ICD-10-CM

## 2018-02-09 DIAGNOSIS — R262 Difficulty in walking, not elsewhere classified: Secondary | ICD-10-CM | POA: Diagnosis not present

## 2018-02-09 DIAGNOSIS — M25551 Pain in right hip: Secondary | ICD-10-CM

## 2018-02-09 NOTE — Therapy (Signed)
[place], [place], [address] Phone: [phone]   Fax:  [phone]  Physical Therapy Treatment  Patient Details  Name: Jacob Arellano MRN: [id number] Date of Birth: 1955/09/10 Referring Provider: Penelope Coop, MD   Encounter Date: 02/09/2018  PT End of Session - 02/09/18 1031    Visit Number  11    Number of Visits  13    Date for PT Re-Evaluation  02/19/18 mini-reassess completed 01/28/18    Authorization Type  BCBS Other    Authorization Time Period  12/29/17 to 02/19/18 (POC is 6 weeks after f/u visit s/p R THA on 01/05/18)    Authorization - Visit Number  11    Authorization - Number of Visits  30    PT Start Time  1030    PT Stop Time  1110    PT Time Calculation (min)  40 min    Activity Tolerance  Patient tolerated treatment well    Behavior During Therapy  [facility] for tasks assessed/performed       Past Medical History:  Diagnosis Date  . Depression   . Hypercholesteremia   . Hypertension   . Sleep apnea     Past Surgical History:  Procedure Laterality Date  . BACK SURGERY    . broken nose    . CATARACT EXTRACTION W/PHACO Right 11/01/2012   Procedure: CATARACT EXTRACTION PHACO AND INTRAOCULAR LENS PLACEMENT (IOC);  Surgeon: Tonny Branch, MD;  Location: [facility];  Service: Ophthalmology;  Laterality: Right;  CDE=7.50  . CATARACT EXTRACTION W/PHACO Left 11/15/2012   Procedure: CATARACT EXTRACTION PHACO AND INTRAOCULAR LENS PLACEMENT (IOC);  Surgeon: Tonny Branch, MD;  Location: [facility];  Service: Ophthalmology;  Laterality: Left;  CDE 6.68  . COLONOSCOPY N/A 10/31/2016   Procedure: COLONOSCOPY;  Surgeon: Danie Binder, MD;  Location: [facility];  Service: Endoscopy;  Laterality: N/A;  11:45 pm  . GANGLION CYST EXCISION     Bilateral hands  . left bicep repair    . NASAL SEPTUM SURGERY    . ROTATOR CUFF REPAIR     right  . TONSILLECTOMY    . VITRECTOMY      There were no vitals filed for this  visit.  Subjective Assessment - 02/09/18 1031    Subjective  Pt states that he had a good weekend. He walked around his house and work without his cane this weekend. He has just a tad bit of LBP today but nothing to rate.    Patient Stated Goals  normal gait and no pain    Currently in Pain?  No/denies    Pain Onset  1 to 4 weeks ago          [facility] Adult PT Treatment/Exercise - 02/09/18 0001      Knee/Hip Exercises: Stretches   Passive Hamstring Stretch  Right;2 reps;30 seconds    Passive Hamstring Stretch Limitations  supine with rope    Quad Stretch  Right;2 reps;30 seconds    Quad Stretch Limitations  prone with rope    Other Knee/Hip Stretches  R hip add/groin stretch 2x30"      Knee/Hip Exercises: Machines for Strengthening   Cybex Knee Extension  2 plates, RLE only, 2x10 reps    Cybex Knee Flexion  4 plates, RLE only, 2x10 reps      Knee/Hip Exercises: Standing   Wall Squat Limitations  wall sits 10x10" holds    SLS  RLE on bosu (  dome down) 5x10" holds    Gait Training  x2 laps around gym without [employer] focusing on decreasing hip IR    Other Standing Knee Exercises  R heel taps on 4" step x20 reps; R hip hikes on 4" step x20 reps      Knee/Hip Exercises: Supine   Single Leg Bridge  Right;2 sets;15 reps RLE elevated on 4" step to increase range of contraction      Knee/Hip Exercises: Prone   Hip Extension  Right;2 sets;15 reps          PT Education - 02/09/18 1031    Education provided  Yes    Education Details  exercise technique, gait mechanics    Person(s) Educated  Patient    Methods  Explanation;Demonstration    Comprehension  Verbalized understanding;Returned demonstration       PT Short Term Goals - 01/28/18 1042      PT SHORT TERM GOAL #1   Title  Pt will be independent with HEP and perform consistently in order to decrease pain and improve overall function.    Baseline  6/6: reports non-compliance    Time  3    Period  Weeks    Status  On-going       PT SHORT TERM GOAL #2   Title  Pt will have improved R hip AROM by 5 deg throughout in order to decrease pain and maximize overall function.    Baseline  6/6: did not assess this date d/t hip precautions    Time  3    Period  Weeks    Status  On-going      PT SHORT TERM GOAL #3   Title  Pt will be able to perform R SLS for at least 20 sec to be symmetrical with LLE in order to maximize gait on uneven ground and demo improved overall strength.    Baseline  6/6: 3 sec on R, 11 sec on L    Time  3    Period  Weeks    Status  On-going      PT SHORT TERM GOAL #4   Title  Pt will report being able to sleep through the night without awakening due to pain at 3x/week or > to maximzie recovery.     Baseline  6/6: doesn't wake up due to pain, just due to discomfort when he tries to change positions, happens every night    Time  3    Period  Weeks    Status  On-going        PT Long Term Goals - 01/28/18 1044      PT LONG TERM GOAL #1   Title  Pt will have improved R hip flexion, IR, and ER AROM by 10 deg or > in order to further decrease pain and improved overall gait and functional tasks.    Baseline  6/6: did not assess this date d/t hip precautions    Time  6    Period  Weeks    Status  On-going      PT LONG TERM GOAL #2   Title  Pt will have improved R hip MMT by 1 grade throughout in order to decrease pain and improve gait, balance, and overall function.    Baseline  6/6: see MMT    Time  6    Period  Weeks    Status  Partially Met      PT LONG TERM GOAL #3  Title  Pt will have improved 30 sec chair rise test to at least 15x or > in order to demo improved balance and functional BLE strength.     Baseline  6/6: 12, decreased weight shift over RLE    Time  6    Period  Weeks    Status  On-going      PT LONG TERM GOAL #4   Title  Pt will have improved 3MWT by 123f or > with no AD, min to no gait deviations, and no increases in pain to demo improved functional strength and  promote community access.     Baseline  5/17: 3264fwith RW    Time  6    Period  Weeks    Status  On-going            Plan - 02/09/18 1110    Clinical Impression Statement  Continued with established POC working on functional strength, balance, and overall gait mechanics. He demo'd only very minor R hip IR during gait with min to no antalgia; PT cleared pt to begin to ambulate without his SPC in the community at familiar places but to still be cautious on unfamiliar terrain. Progressed him throughout therex this date and he tolerated all therex well during session and was able to maintain posterior hip precautions. No pain reported at EOS, just fatigue from. Continue as planned, progressing as able.     Rehab Potential  Good    PT Frequency  2x / week    PT Duration  6 weeks    PT Treatment/Interventions  ADLs/Self Care Home Management;Cryotherapy;Electrical Stimulation;Moist Heat;Traction;Ultrasound;DME Instruction;Gait training;Stair training;Functional mobility training;Therapeutic activities;Therapeutic exercise;Balance training;Neuromuscular re-education;Patient/family education;Manual techniques;Scar mobilization;Passive range of motion;Dry needling;Energy conservation;Taping    PT Next Visit Plan  continue cybex machines, strengthening, stretching, balance, gait training, and functional strengthening within posterior hip precautions; cotninue RLE SLS, 1/2 squats and lunging on bosu, continue scar PRN    PT Home Exercise Plan  5/17: ankle pumps, HS sets, heel slides, supine abd, glute sets, quad sets (continuation from acute care exercises provided at d/c); 5/23: bridgine, SAQ, supine clams    Consulted and Agree with Plan of Care  Patient       Patient will benefit from skilled therapeutic intervention in order to improve the following deficits and impairments:  Abnormal gait, Decreased activity tolerance, Decreased balance, Decreased range of motion, Decreased strength, Difficulty  walking, Hypomobility, Improper body mechanics, Pain, Increased muscle spasms, Increased fascial restricitons  Visit Diagnosis: Pain in right hip  Difficulty in walking, not elsewhere classified  Muscle weakness (generalized)  Other symptoms and signs involving the musculoskeletal system     Problem List Patient Active Problem List   Diagnosis Date Noted  . Hip pain, acute, right 11/17/2017  . Special screening for malignant neoplasms, colon        BrGeraldine SolarT, DPT  [place]hone: [phone] Fax:  [phone]: Jacob GIOVANETTIRN: [id number] of Birth: 09/1955-10-25

## 2018-02-11 ENCOUNTER — Ambulatory Visit (HOSPITAL_COMMUNITY): Payer: BLUE CROSS/BLUE SHIELD

## 2018-02-11 DIAGNOSIS — M6281 Muscle weakness (generalized): Secondary | ICD-10-CM | POA: Diagnosis not present

## 2018-02-11 DIAGNOSIS — R262 Difficulty in walking, not elsewhere classified: Secondary | ICD-10-CM | POA: Diagnosis not present

## 2018-02-11 DIAGNOSIS — R29898 Other symptoms and signs involving the musculoskeletal system: Secondary | ICD-10-CM | POA: Diagnosis not present

## 2018-02-11 DIAGNOSIS — M25551 Pain in right hip: Secondary | ICD-10-CM

## 2018-02-11 NOTE — Therapy (Signed)
[place], [place], [address] Phone: [phone]   Fax:  [phone]  Physical Therapy Treatment  Patient Details  Name: Jacob Arellano MRN: [id number] Date of Birth: Jan 21, 1956 Referring Provider: Penelope Coop, MD   Encounter Date: 02/11/2018  PT End of Session - 02/11/18 1033    Visit Number  12    Number of Visits  13    Date for PT Re-Evaluation  02/19/18 mini-reassess completed 01/28/18    Authorization Type  BCBS Other    Authorization Time Period  12/29/17 to 02/19/18 (POC is 6 weeks after f/u visit s/p R THA on 01/05/18)    Authorization - Visit Number  12    Authorization - Number of Visits  30    PT Start Time  1030    PT Stop Time  1110    PT Time Calculation (min)  40 min    Activity Tolerance  Patient tolerated treatment well    Behavior During Therapy  [facility] for tasks assessed/performed       Past Medical History:  Diagnosis Date  . Depression   . Hypercholesteremia   . Hypertension   . Sleep apnea     Past Surgical History:  Procedure Laterality Date  . BACK SURGERY    . broken nose    . CATARACT EXTRACTION W/PHACO Right 11/01/2012   Procedure: CATARACT EXTRACTION PHACO AND INTRAOCULAR LENS PLACEMENT (IOC);  Surgeon: Tonny Branch, MD;  Location: [facility];  Service: Ophthalmology;  Laterality: Right;  CDE=7.50  . CATARACT EXTRACTION W/PHACO Left 11/15/2012   Procedure: CATARACT EXTRACTION PHACO AND INTRAOCULAR LENS PLACEMENT (IOC);  Surgeon: Tonny Branch, MD;  Location: [facility];  Service: Ophthalmology;  Laterality: Left;  CDE 6.68  . COLONOSCOPY N/A 10/31/2016   Procedure: COLONOSCOPY;  Surgeon: Danie Binder, MD;  Location: [facility];  Service: Endoscopy;  Laterality: N/A;  11:45 pm  . GANGLION CYST EXCISION     Bilateral hands  . left bicep repair    . NASAL SEPTUM SURGERY    . ROTATOR CUFF REPAIR     right  . TONSILLECTOMY    . VITRECTOMY      There were no vitals filed for this  visit.  Subjective Assessment - 02/11/18 1033    Subjective  Pt reports that he is feeling pretty good. He went all day yesterday without his [facility] so he feels a little sore today. He's just carrying his SPC this date as precaution and using it intermittently.    Patient Stated Goals  normal gait and no pain    Currently in Pain?  No/denies    Pain Onset  1 to 4 weeks ago            [facility] Adult PT Treatment/Exercise - 02/11/18 0001      Knee/Hip Exercises: Stretches   Passive Hamstring Stretch  Right;2 reps;30 seconds    Passive Hamstring Stretch Limitations  supine with rope    Quad Stretch  Right;2 reps;30 seconds    Quad Stretch Limitations  prone with rope    Other Knee/Hip Stretches  R hip add/groin stretch 2x30"      Knee/Hip Exercises: Machines for Strengthening   Cybex Knee Extension  2.5 plates, RLE only, 2x15 reps    Cybex Knee Flexion  4.4 plates, RLE only, 2x15 reps      Knee/Hip Exercises: Standing   Hip Extension  Right;2 sets;15 reps;Limitations    Extension Limitations  3#, bent over counter    Wall Squat Limitations  wall sits 10x10" holds    SLS with Vectors  RLE on bosu (dome down) x10RT    Rebounder  RLE only on foam x30 tosses with green ball       Knee/Hip Exercises: Prone   Hip Extension  Right;2 sets;15 reps    Hip Extension Limitations  3#             PT Education - 02/11/18 1033    Education provided  Yes    Education Details  exercise technique    Person(s) Educated  Patient    Methods  Explanation;Demonstration    Comprehension  Verbalized understanding;Returned demonstration       PT Short Term Goals - 01/28/18 1042      PT SHORT TERM GOAL #1   Title  Pt will be independent with HEP and perform consistently in order to decrease pain and improve overall function.    Baseline  6/6: reports non-compliance    Time  3    Period  Weeks    Status  On-going      PT SHORT TERM GOAL #2   Title  Pt will have improved R hip AROM by 5 deg  throughout in order to decrease pain and maximize overall function.    Baseline  6/6: did not assess this date d/t hip precautions    Time  3    Period  Weeks    Status  On-going      PT SHORT TERM GOAL #3   Title  Pt will be able to perform R SLS for at least 20 sec to be symmetrical with LLE in order to maximize gait on uneven ground and demo improved overall strength.    Baseline  6/6: 3 sec on R, 11 sec on L    Time  3    Period  Weeks    Status  On-going      PT SHORT TERM GOAL #4   Title  Pt will report being able to sleep through the night without awakening due to pain at 3x/week or > to maximzie recovery.     Baseline  6/6: doesn't wake up due to pain, just due to discomfort when he tries to change positions, happens every night    Time  3    Period  Weeks    Status  On-going        PT Long Term Goals - 01/28/18 1044      PT LONG TERM GOAL #1   Title  Pt will have improved R hip flexion, IR, and ER AROM by 10 deg or > in order to further decrease pain and improved overall gait and functional tasks.    Baseline  6/6: did not assess this date d/t hip precautions    Time  6    Period  Weeks    Status  On-going      PT LONG TERM GOAL #2   Title  Pt will have improved R hip MMT by 1 grade throughout in order to decrease pain and improve gait, balance, and overall function.    Baseline  6/6: see MMT    Time  6    Period  Weeks    Status  Partially Met      PT LONG TERM GOAL #3   Title  Pt will have improved 30 sec chair rise test to at least 15x or > in order to  demo improved balance and functional BLE strength.     Baseline  6/6: 12, decreased weight shift over RLE    Time  6    Period  Weeks    Status  On-going      PT LONG TERM GOAL #4   Title  Pt will have improved 3MWT by 154f or > with no AD, min to no gait deviations, and no increases in pain to demo improved functional strength and promote community access.     Baseline  5/17: 3263fwith RW    Time  6     Period  Weeks    Status  On-going            Plan - 02/11/18 1112    Clinical Impression Statement  Pt overall progressing nicely as he reported being able to ambulate all day yesterday without his cane with only minimal soreness today. Continued with current POC focusing on functional strength and dynamic balance. Able to add weights and progress his dynamic balance, all with good tolerance. Pt only reporting some LBP with prone hip extension but PT feels this was due to pt's weak glutes and him compensating with this lumbar extensors. He is still challenged with dynamic balance activities. No pain at EOS, just muscle fatigue reported. Continue as planned, progressing as able.     Rehab Potential  Good    PT Frequency  2x / week    PT Duration  6 weeks    PT Treatment/Interventions  ADLs/Self Care Home Management;Cryotherapy;Electrical Stimulation;Moist Heat;Traction;Ultrasound;DME Instruction;Gait training;Stair training;Functional mobility training;Therapeutic activities;Therapeutic exercise;Balance training;Neuromuscular re-education;Patient/family education;Manual techniques;Scar mobilization;Passive range of motion;Dry needling;Energy conservation;Taping    PT Next Visit Plan  reassess next visit; add 3-way hip at cable column; continue cybex machines, strengthening, stretching, balance, gait training, and functional strengthening within posterior hip precautions; cotninue RLE SLS, 1/2 squats and lunging on bosu, continue scar PRN    PT Home Exercise Plan  5/17: ankle pumps, HS sets, heel slides, supine abd, glute sets, quad sets (continuation from acute care exercises provided at d/c); 5/23: bridgine, SAQ, supine clams    Consulted and Agree with Plan of Care  Patient       Patient will benefit from skilled therapeutic intervention in order to improve the following deficits and impairments:  Abnormal gait, Decreased activity tolerance, Decreased balance, Decreased range of motion,  Decreased strength, Difficulty walking, Hypomobility, Improper body mechanics, Pain, Increased muscle spasms, Increased fascial restricitons  Visit Diagnosis: Pain in right hip  Difficulty in walking, not elsewhere classified  Muscle weakness (generalized)  Other symptoms and signs involving the musculoskeletal system     Problem List Patient Active Problem List   Diagnosis Date Noted  . Hip pain, acute, right 11/17/2017  . Special screening for malignant neoplasms, colon        BrGeraldine SolarT, DPT  [place]hone: [phone] Fax:  [phone]: WiARVAL BRANDSTETTERRN: [id number] of Birth: 09/1955-05-25

## 2018-02-16 ENCOUNTER — Ambulatory Visit (HOSPITAL_COMMUNITY): Payer: BLUE CROSS/BLUE SHIELD

## 2018-02-16 DIAGNOSIS — R262 Difficulty in walking, not elsewhere classified: Secondary | ICD-10-CM

## 2018-02-16 DIAGNOSIS — M25551 Pain in right hip: Secondary | ICD-10-CM

## 2018-02-16 DIAGNOSIS — M6281 Muscle weakness (generalized): Secondary | ICD-10-CM

## 2018-02-16 DIAGNOSIS — R29898 Other symptoms and signs involving the musculoskeletal system: Secondary | ICD-10-CM | POA: Diagnosis not present

## 2018-02-16 NOTE — Therapy (Signed)
[place], [place], [address] Phone: [phone]   Fax:  [phone]   Progress Note Reporting Period 01/28/18 to 02/16/18  See note below for Objective Data and Assessment of Progress/Goals.    Physical Therapy Treatment  Patient Details  Name: Jacob Arellano MRN: [id number] Date of Birth: 1956/05/03 Referring Provider: Penelope Coop, MD   Encounter Date: 02/16/2018  PT End of Session - 02/16/18 1030    Visit Number  13    Number of Visits  14 pt's PT cert good until 5/72/62, added additional visit to cover his next PT appointment on 02/18/18    Date for PT Re-Evaluation  02/19/18    Authorization Type  BCBS Other    Authorization Time Period  12/29/17 to 02/19/18 (POC is 6 weeks after f/u visit s/p R THA on 01/05/18)    Authorization - Visit Number  13    Authorization - Number of Visits  30    PT Start Time  1029    PT Stop Time  1109    PT Time Calculation (min)  40 min    Activity Tolerance  Patient tolerated treatment well    Behavior During Therapy  [facility] for tasks assessed/performed       Past Medical History:  Diagnosis Date  . Depression   . Hypercholesteremia   . Hypertension   . Sleep apnea     Past Surgical History:  Procedure Laterality Date  . BACK SURGERY    . broken nose    . CATARACT EXTRACTION W/PHACO Right 11/01/2012   Procedure: CATARACT EXTRACTION PHACO AND INTRAOCULAR LENS PLACEMENT (IOC);  Surgeon: Tonny Branch, MD;  Location: [facility];  Service: Ophthalmology;  Laterality: Right;  CDE=7.50  . CATARACT EXTRACTION W/PHACO Left 11/15/2012   Procedure: CATARACT EXTRACTION PHACO AND INTRAOCULAR LENS PLACEMENT (IOC);  Surgeon: Tonny Branch, MD;  Location: [facility];  Service: Ophthalmology;  Laterality: Left;  CDE 6.68  . COLONOSCOPY N/A 10/31/2016   Procedure: COLONOSCOPY;  Surgeon: Danie Binder, MD;  Location: [facility];  Service: Endoscopy;  Laterality: N/A;  11:45 pm  . GANGLION  CYST EXCISION     Bilateral hands  . left bicep repair    . NASAL SEPTUM SURGERY    . ROTATOR CUFF REPAIR     right  . TONSILLECTOMY    . VITRECTOMY      There were no vitals filed for this visit.  Subjective Assessment - 02/16/18 1031    Subjective  Pt reports that he is feeling good. He's still stiff every morning when he wakes up but once he's moving around he's fine. Able to do more without his cane.    Patient Stated Goals  normal gait and no pain    Currently in Pain?  No/denies    Pain Onset  1 to 4 weeks ago         [facility] PT Assessment - 02/16/18 0001      Assessment   Medical Diagnosis  Primary OA of R hip    Referring Provider  Penelope Coop, MD    Onset Date/Surgical Date  01/05/18    Next MD Visit  02/18/2018    Prior Therapy  OT for shoulder; no PT      Precautions   Precautions  Posterior Hip    Type of Shoulder Precautions  progress as tolerated      Sit to Stand   Comments  30  sec chair rise test: 14x was 12 with decreased weight shift over LLE      Strength   Right Hip Flexion  5/5 was 4+    Right Hip Extension  4/5 was 3-    Right Hip ABduction  4-/5 was 3+    Left Hip Extension  4/5 was 4-    Left Hip ABduction  4/5 was 4      Ambulation/Gait   Ambulation Distance (Feet)  836 Feet 3MWT; was 794 wtih SPC    Assistive device  None    Gait Pattern  Within Functional Limits      Balance   Balance Assessed  Yes      Static Standing Balance   Static Standing - Balance Support  No upper extremity supported    Static Standing Balance -  Activities   Single Leg Stance - Right Leg;Single Leg Stance - Left Leg    Static Standing - Comment/# of Minutes  R: 16.5sec L: 31 sec was 3 sec RLE, 11 sec LLE          [facility] Adult PT Treatment/Exercise - 02/16/18 0001      Knee/Hip Exercises: Machines for Strengthening   Cybex Knee Extension  5 plates, RLE only, 2x10 reps    Cybex Knee Flexion  5 plates, RLE only, 2x15    Total Gym Leg Press  26deg,  RLE only, 2x20      Knee/Hip Exercises: Standing   Wall Squat Limitations  wall sits 10x10" holds           PT Education - 02/16/18 1030    Education provided  Yes    Education Details  reassessment findings    Person(s) Educated  Patient    Methods  Explanation;Demonstration    Comprehension  Verbalized understanding;Returned demonstration       PT Short Term Goals - 02/16/18 1034      PT SHORT TERM GOAL #1   Title  Pt will be independent with HEP and perform consistently in order to decrease pain and improve overall function.    Baseline  6/25: reports non-compliance    Time  3    Period  Weeks    Status  On-going      PT SHORT TERM GOAL #2   Title  Pt will have improved R hip AROM by 5 deg throughout in order to decrease pain and maximize overall function.    Baseline  6/25: did not assess this date d/t hip precautions    Time  3    Period  Weeks    Status  Deferred      PT SHORT TERM GOAL #3   Title  Pt will be able to perform R SLS for at least 20 sec to be symmetrical with LLE in order to maximize gait on uneven ground and demo improved overall strength.    Baseline  6/25: 16.5sec R, 31 sec L    Time  3    Period  Weeks    Status  On-going      PT SHORT TERM GOAL #4   Title  Pt will report being able to sleep through the night without awakening due to pain at 3x/week or > to maximzie recovery.     Baseline  6/25: feels his sleeping habits are back to normal    Time  3    Period  Weeks    Status  Achieved        PT Long  Term Goals - 02/16/18 1035      PT LONG TERM GOAL #1   Title  Pt will have improved R hip flexion, IR, and ER AROM by 10 deg or > in order to further decrease pain and improved overall gait and functional tasks.    Baseline  6/25: did not assess this date d/t hip precautions    Time  6    Period  Weeks    Status  Deferred      PT LONG TERM GOAL #2   Title  Pt will have improved R hip MMT by 1 grade throughout in order to decrease  pain and improve gait, balance, and overall function.    Baseline  6/25: see MMT    Time  6    Period  Weeks    Status  Partially Met      PT LONG TERM GOAL #3   Title  Pt will have improved 30 sec chair rise test to at least 15x or > in order to demo improved balance and functional BLE strength.     Baseline  6/25: 14, mechanics WFL    Time  6    Period  Weeks    Status  On-going      PT LONG TERM GOAL #4   Title  Pt will have improved 3MWT by 191f or > with no AD, min to no gait deviations, and no increases in pain to demo improved functional strength and promote community access.     Baseline  6/25: 8375fno AD    Time  6    Period  Weeks    Status  Achieved            Plan - 02/16/18 1111    Clinical Impression Statement  PT reassessed pt's goals and outcome measures this date. Pt has made great progress towards goals since his R THA on 01/05/18. His MMT is steadily improving, his balance has significantly improved but not yet symmetrical with his LLE. His gait is now [facility] AD during 3MWT; he does state that he tends to use his SPC by the end of the day due to fatigue but overall, he's pleased with his progress. Pt has his f/u with his MD on Thursday, 02/18/18; PT recommends having pt f/u with therapy after that MD visit and placing him on an HEP POC at that time to allow for pt to manage his progression at home, due to progress made and limitations in insurance coverage (30 visit limit and has used 13 as of 02/16/18). Pt agreeable to this plan and verbalized that he is ready to start being compliant with his HEP at home. Overall, pt has progressed very well; ended session with strengthening.    Rehab Potential  Good    PT Frequency  2x / week    PT Duration  6 weeks    PT Treatment/Interventions  ADLs/Self Care Home Management;Cryotherapy;Electrical Stimulation;Moist Heat;Traction;Ultrasound;DME Instruction;Gait training;Stair training;Functional mobility training;Therapeutic  activities;Therapeutic exercise;Balance training;Neuromuscular re-education;Patient/family education;Manual techniques;Scar mobilization;Passive range of motion;Dry needling;Energy conservation;Taping    PT Next Visit Plan  initiate advanced strengthening HEP for pt's HEP POC; add 3-way hip at cable column; continue cybex machines, strengthening, stretching, balance, gait training, and functional strengthening within posterior hip precautions; cotninue RLE SLS, 1/2 squats and lunging on bosu, continue scar PRN    PT Home Exercise Plan  5/17: ankle pumps, HS sets, heel slides, supine abd, glute sets, quad sets (continuation from acute care exercises  provided at d/c); 5/23: bridgine, SAQ, supine clams    Consulted and Agree with Plan of Care  Patient       Patient will benefit from skilled therapeutic intervention in order to improve the following deficits and impairments:  Abnormal gait, Decreased activity tolerance, Decreased balance, Decreased range of motion, Decreased strength, Difficulty walking, Hypomobility, Improper body mechanics, Pain, Increased muscle spasms, Increased fascial restricitons  Visit Diagnosis: Pain in right hip  Difficulty in walking, not elsewhere classified  Muscle weakness (generalized)  Other symptoms and signs involving the musculoskeletal system     Problem List Patient Active Problem List   Diagnosis Date Noted  . Hip pain, acute, right 11/17/2017  . Special screening for malignant neoplasms, colon        Geraldine Solar PT, DPT  [place] [address] [place], [place], [address] Phone: [phone]   Fax:  [phone]  Name: MERRIK PUEBLA MRN: [id number] Date of Birth: March 08, 1956

## 2018-02-18 ENCOUNTER — Ambulatory Visit (HOSPITAL_COMMUNITY): Payer: BLUE CROSS/BLUE SHIELD

## 2018-02-18 DIAGNOSIS — R262 Difficulty in walking, not elsewhere classified: Secondary | ICD-10-CM

## 2018-02-18 DIAGNOSIS — M6281 Muscle weakness (generalized): Secondary | ICD-10-CM | POA: Diagnosis not present

## 2018-02-18 DIAGNOSIS — Z96641 Presence of right artificial hip joint: Secondary | ICD-10-CM | POA: Diagnosis not present

## 2018-02-18 DIAGNOSIS — R29898 Other symptoms and signs involving the musculoskeletal system: Secondary | ICD-10-CM | POA: Diagnosis not present

## 2018-02-18 DIAGNOSIS — M25551 Pain in right hip: Secondary | ICD-10-CM

## 2018-02-18 DIAGNOSIS — Z471 Aftercare following joint replacement surgery: Secondary | ICD-10-CM | POA: Diagnosis not present

## 2018-02-18 NOTE — Patient Instructions (Signed)
Access Code: [email or web address]  URL: [email or web address]  Date: 02/18/2018  Prepared by: Geraldine Solar   Exercises Butterfly Groin Stretch - 10 reps - 3 sets - 1x daily - 7x weekly Supine Butterfly Groin Stretch - 10 reps - 3 sets - 1x daily - 7x weekly Wall Squat - 10 reps - 3 sets - 10 hold - 1x daily - 7x weekly Knee Extension with Weight Machine - 10 reps - 3 sets - 1x daily - 7x weekly Single Leg Knee Extension with Weight Machine - 10 reps - 3 sets                            - 1x daily - 7x weekly Eccentric Knee Extension with Weight Machine - 10 reps - 3 sets - 1x daily - 7x weekly Hamstring Curl with Weight Machine - 10 reps - 3 sets - 1x daily - 7x weekly Single Leg Hamstring Curl with Weight Machine - 10 reps - 3 sets                            - 1x daily - 7x weekly Eccentric Hamstring Curl with Weight Machine - 10 reps - 3 sets - 1x daily - 7x weekly Side Stepping with Resistance at Ankles - 10 reps - 3 sets - 1x daily - 7x weekly Sidelying Hip Abduction - 10 reps - 3 sets - 1x daily - 7x weekly Standing Hip Abduction with Resistance at Ankles - 10 reps - 3 sets - 1x daily - 7x weekly Mini Squat with Chair - 10 reps - 3 sets - 1x daily - 7x weekly Supine Bridge - 10 reps - 3 sets - 1x daily - 7x weekly Step Up - 10 reps - 3 sets - 1x daily - 7x weekly Lateral Step Ups - 10 reps - 3 sets - 1x daily - 7x weekly Diagonal Hip Extension with Resistance - 10 reps - 3 sets - 1x daily - 7x weekly Supine Hip Extension on Bench - 10 reps - 3 sets - 1x daily - 7x weekly Standing Cable Hip Extension - 10 reps - 3 sets - 1x daily - 7x weekly Standing Cable Hip Abduction - 10 reps - 3 sets - 1x daily - 7x weekly Standing Cable Hip Flexion - 10 reps - 3 sets - 1x daily - 7x weekly                  Prone Hip Extension on Table - 10 reps - 3 sets - 1x daily - 7x weekly Full Leg Press - 10 reps - 3 sets - 1x daily - 7x weekly Eccentric Leg Press - 10 reps - 3 sets - 1x daily - 7x  weekly Single Leg Press - 10 reps - 3 sets - 1x daily - 7x weekly

## 2018-02-18 NOTE — Therapy (Addendum)
[place], [place], [address] Phone: [phone]   Fax:  [phone]  Physical Therapy Treatment/Discharge Summary  Patient Details  Name: Jacob Arellano MRN: [id number] Date of Birth: Aug 10, 1956 Referring Provider: Penelope Coop, MD  05/06/2018 - PHYSICAL THERAPY DISCHARGE SUMMARY  Visits from Start of Care: 05/17-06/27/2019  Current functional level related to goals / functional outcomes: Per Clinical Impression Statement below, patient has been participating in an independent HEP and has not contacted PT will any questions or concerns to this date. Will DC from PT.   Plan: Patient agrees to discharge.  Patient goals were partially met. Patient is being discharged due to being pleased with the current functional level.  ?????       Encounter Date: 02/18/2018  PT End of Session - 02/18/18 1521    Visit Number  14    Number of Visits  15    Date for PT Re-Evaluation  03/18/18    Authorization Type  [employer] Other    Authorization Time Period  NEW: 02/18/18 to 03/18/18 (4-week HEP)    Authorization - Visit Number  14    Authorization - Number of Visits  30    PT Start Time  1520    PT Stop Time  1539    PT Time Calculation (min)  19 min    Activity Tolerance  Patient tolerated treatment well    Behavior During Therapy  WFL for tasks assessed/performed       Past Medical History:  Diagnosis Date  . Depression   . Hypercholesteremia   . Hypertension   . Sleep apnea     Past Surgical History:  Procedure Laterality Date  . BACK SURGERY    . broken nose    . CATARACT EXTRACTION W/PHACO Right 11/01/2012   Procedure: CATARACT EXTRACTION PHACO AND INTRAOCULAR LENS PLACEMENT (IOC);  Surgeon: Tonny Branch, MD;  Location: [facility];  Service: Ophthalmology;  Laterality: Right;  CDE=7.50  . CATARACT EXTRACTION W/PHACO Left 11/15/2012   Procedure: CATARACT EXTRACTION PHACO AND INTRAOCULAR LENS PLACEMENT (IOC);   Surgeon: Tonny Branch, MD;  Location: [facility];  Service: Ophthalmology;  Laterality: Left;  CDE 6.68  . COLONOSCOPY N/A 10/31/2016   Procedure: COLONOSCOPY;  Surgeon: Danie Binder, MD;  Location: [facility];  Service: Endoscopy;  Laterality: N/A;  11:45 pm  . GANGLION CYST EXCISION     Bilateral hands  . left bicep repair    . NASAL SEPTUM SURGERY    . ROTATOR CUFF REPAIR     right  . TONSILLECTOMY    . VITRECTOMY      There were no vitals filed for this visit.  Subjective Assessment - 02/18/18 1521    Subjective  Pt states that his MD cleared him for everything. X-rays were good. He said his ROM was great. He has cleared him from all precautions and has a green light to do whatever he wants to do.     Patient Stated Goals  normal gait and no pain    Currently in Pain?  No/denies    Pain Onset  1 to 4 weeks ago         PT Education - 02/18/18 1521    Education provided  Yes    Education Details  HEP POC, updated strengthening HEP    Person(s) Educated  Patient    Methods  Explanation;Demonstration;Handout    Comprehension  Verbalized understanding;Returned demonstration  PT Short Term Goals - 02/18/18 1546      PT SHORT TERM GOAL #1   Title  Pt will be independent with HEP and perform consistently in order to decrease pain and improve overall function.    Baseline  6/27: provided advanced strengthening HEP and pt stating he was going to complete 3-4x/week    Time  3    Period  Weeks    Status  On-going      PT SHORT TERM GOAL #2   Title  Pt will have improved R hip AROM by 5 deg throughout in order to decrease pain and maximize overall function.    Baseline  6/27: per pt, his MD told him his ROM looked great and he is cleared of all precautions and can do whatever he wants    Time  3    Period  Weeks    Status  Achieved      PT SHORT TERM GOAL #3   Title  Pt will be able to perform R SLS for at least 20 sec to be symmetrical with LLE in order to maximize gait  on uneven ground and demo improved overall strength.    Baseline  6/25: 16.5sec R, 31 sec L    Time  3    Period  Weeks    Status  On-going      PT SHORT TERM GOAL #4   Title  Pt will report being able to sleep through the night without awakening due to pain at 3x/week or > to maximzie recovery.     Baseline  6/25: feels his sleeping habits are back to normal    Time  3    Period  Weeks    Status  Achieved        PT Long Term Goals - 02/18/18 1547      PT LONG TERM GOAL #1   Title  Pt will have improved R hip flexion, IR, and ER AROM by 10 deg or > in order to further decrease pain and improved overall gait and functional tasks.    Baseline  6/27: per pt, his MD told him his ROM looked great and he is cleared of all precautions and can do whatever he wants    Time  6    Period  Weeks    Status  Achieved      PT LONG TERM GOAL #2   Title  Pt will have improved R hip MMT by 1 grade throughout in order to decrease pain and improve gait, balance, and overall function.    Baseline  6/25: see MMT    Time  6    Period  Weeks    Status  Partially Met      PT LONG TERM GOAL #3   Title  Pt will have improved 30 sec chair rise test to at least 15x or > in order to demo improved balance and functional BLE strength.     Baseline  6/25: 14, mechanics WFL    Time  6    Period  Weeks    Status  On-going      PT LONG TERM GOAL #4   Title  Pt will have improved 3MWT by 126f or > with no AD, min to no gait deviations, and no increases in pain to demo improved functional strength and promote community access.     Baseline  6/25: 8346fno AD    Time  6  Period  Weeks    Status  Achieved            Plan - 02/18/18 1600    Clinical Impression Statement  Pt presents to therapy stating that his MD was very pleased with his progress and has cleared him of all precautions and he can do whatever he wants. Per POC from last session, PT provided pt with advanced strengthening HEP and he  verbalized understanding. Did not perform formal reassessment this date as it was performed on 02/16/18. PT educated pt that while on this 4-week HEP POC, if he needs to come in sooner for any reason then he can call and come in earlier or use his 1 f/u visit that is scheduled. However, if his hip feels good and he doesn't have any concerns, then PT asked pt to call and cancel that appointment and he will be fully discharged at that time. PT has no concerns with pt at this point and feels he is safe to be placed on independent HEP POC for the next 4 weeks and anticipate full d/c at that time.    Rehab Potential  Good    PT Frequency  4x / week 1 f/u visit in 4 weeks    PT Treatment/Interventions  ADLs/Self Care Home Management;Cryotherapy;Electrical Stimulation;Moist Heat;Traction;Ultrasound;DME Instruction;Gait training;Stair training;Functional mobility training;Therapeutic activities;Therapeutic exercise;Balance training;Neuromuscular re-education;Patient/family education;Manual techniques;Scar mobilization;Passive range of motion;Dry needling;Energy conservation;Taping    PT Next Visit Plan  reassess if needed or fully d/c if no concerns from HEP POC    PT Home Exercise Plan  5/17: ankle pumps, HS sets, heel slides, supine abd, glute sets, quad sets (continuation from acute care exercises provided at d/c); 5/23: bridgine, SAQ, supine clams; 6/27: see extensive additions below.    Consulted and Agree with Plan of Care  Patient       Patient will benefit from skilled therapeutic intervention in order to improve the following deficits and impairments:  Abnormal gait, Decreased activity tolerance, Decreased balance, Decreased range of motion, Decreased strength, Difficulty walking, Hypomobility, Improper body mechanics, Pain, Increased muscle spasms, Increased fascial restricitons  Visit Diagnosis: Pain in right hip - Plan: PT plan of care cert/re-cert  Difficulty in walking, not elsewhere classified -  Plan: PT plan of care cert/re-cert  Muscle weakness (generalized) - Plan: PT plan of care cert/re-cert  Other symptoms and signs involving the musculoskeletal system - Plan: PT plan of care cert/re-cert     Problem List Patient Active Problem List   Diagnosis Date Noted  . Hip pain, acute, right 11/17/2017  . Special screening for malignant neoplasms, colon         Geraldine Solar PT, DPT  [facility] [address] [place], [place], [address] Phone: [phone]   Fax:  [phone]  Name: Jacob Arellano MRN: [id number] Date of Birth: [birth year]

## 2018-03-04 DIAGNOSIS — Z9889 Other specified postprocedural states: Secondary | ICD-10-CM | POA: Diagnosis not present

## 2018-03-04 DIAGNOSIS — M12811 Other specific arthropathies, not elsewhere classified, right shoulder: Secondary | ICD-10-CM | POA: Diagnosis not present

## 2018-03-04 DIAGNOSIS — M25511 Pain in right shoulder: Secondary | ICD-10-CM | POA: Diagnosis not present

## 2018-03-04 DIAGNOSIS — M75101 Unspecified rotator cuff tear or rupture of right shoulder, not specified as traumatic: Secondary | ICD-10-CM | POA: Diagnosis not present

## 2018-03-12 IMAGING — US US CAROTID DUPLEX BILAT
1 series · 13 of 24 positions shown · non-contrast
Comparison: None.

CLINICAL DATA: Bilateral ocular hypertension. History of
hyperlipidemia.

EXAM:
BILATERAL CAROTID DUPLEX ULTRASOUND
TECHNIQUE: Gray scale imaging, color Doppler and duplex ultrasound were
performed of bilateral carotid and vertebral arteries in the neck.

[Series 1: us carotid duplex bilat · 0.07mm/px · 13 of 68 slices shown]
[im 1/68]
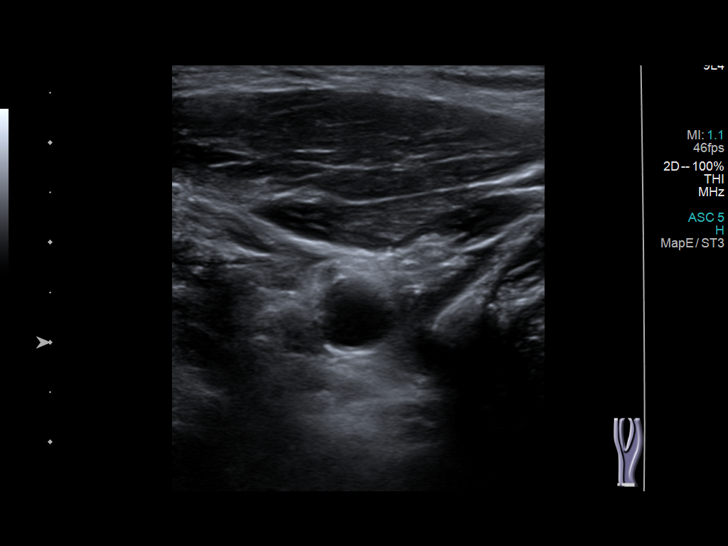
[im 6/68]
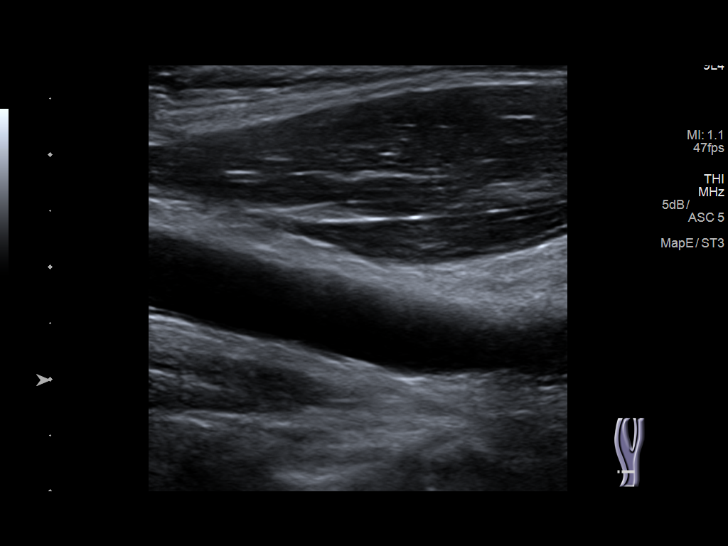
[im 12/68]
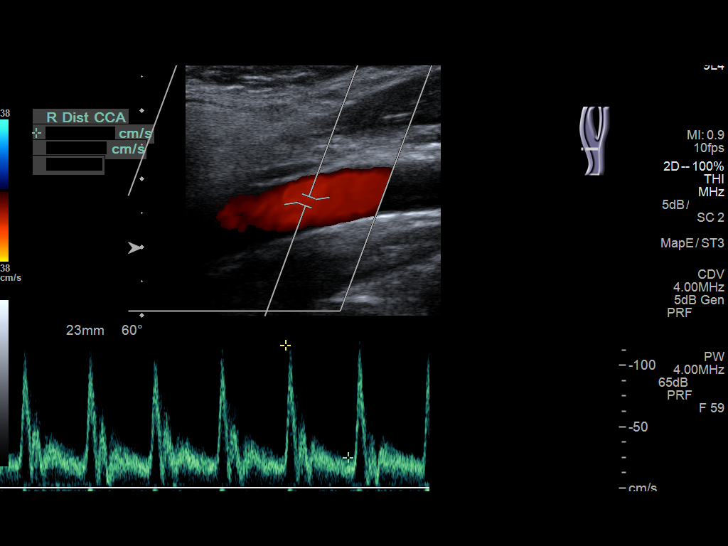
[im 18/68]
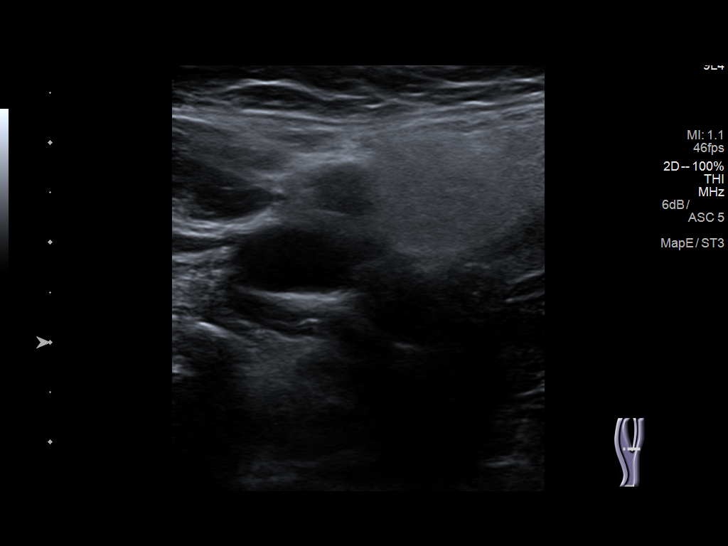
[im 24/68]
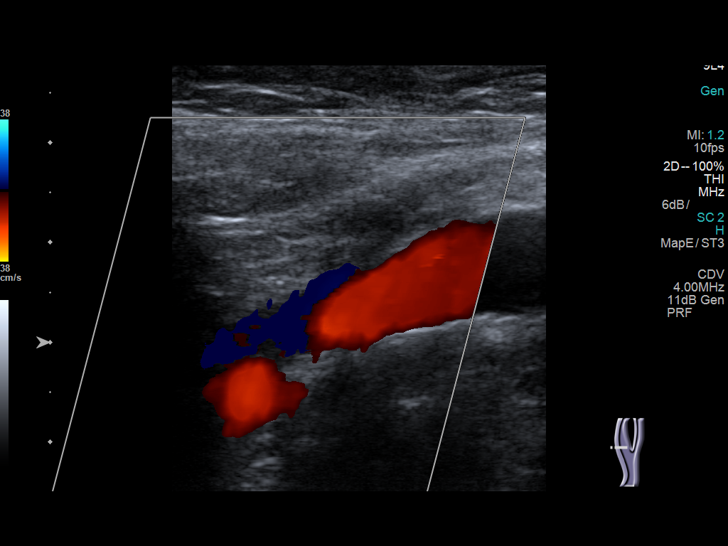
[im 30/68]
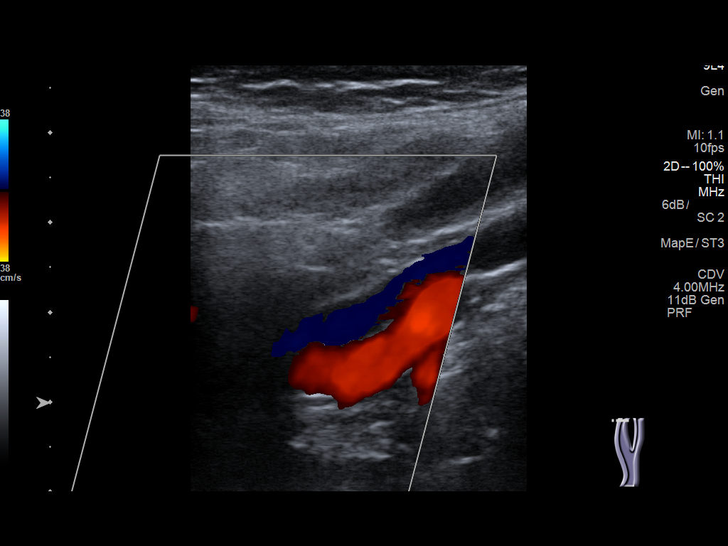
[im 35/68]
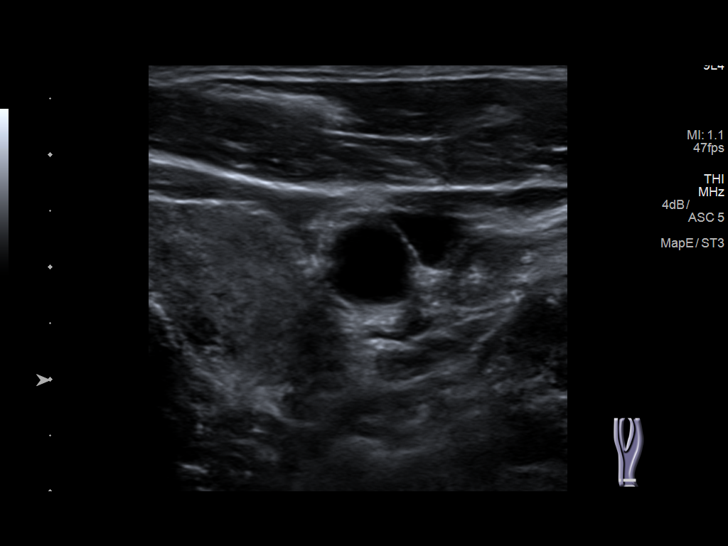
[im 38/68]
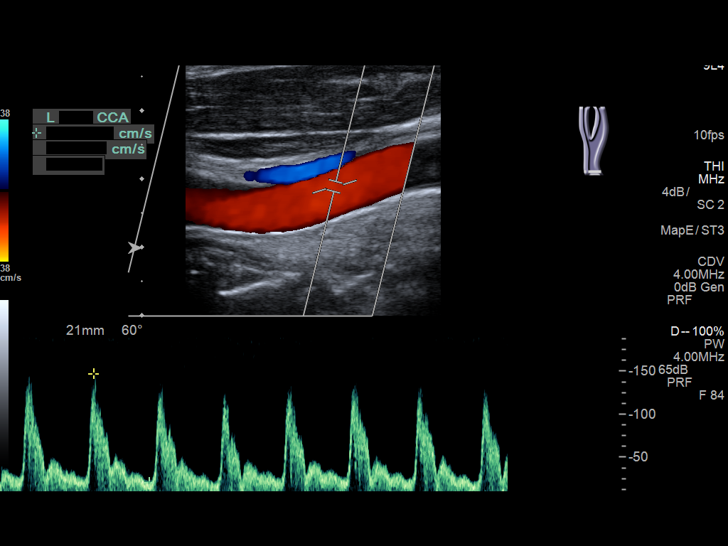
[im 44/68]
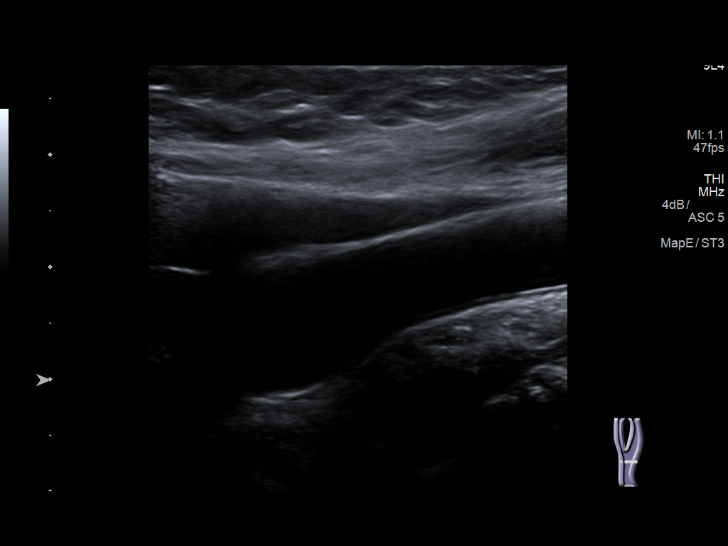
[im 50/68]
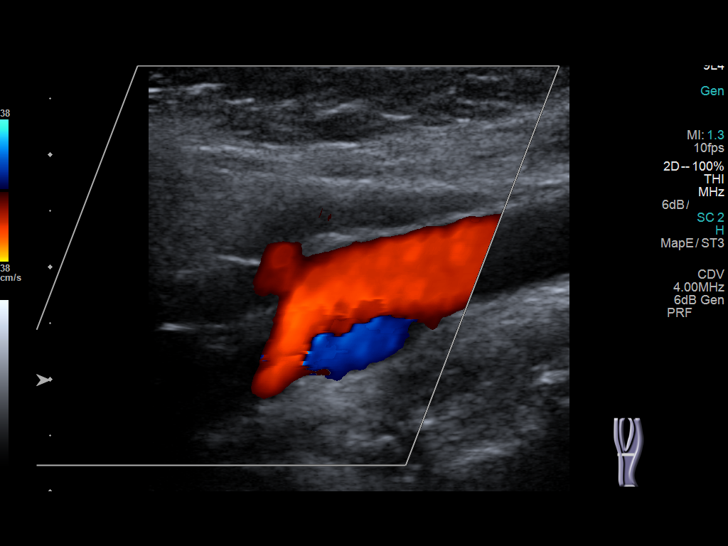
[im 56/68]
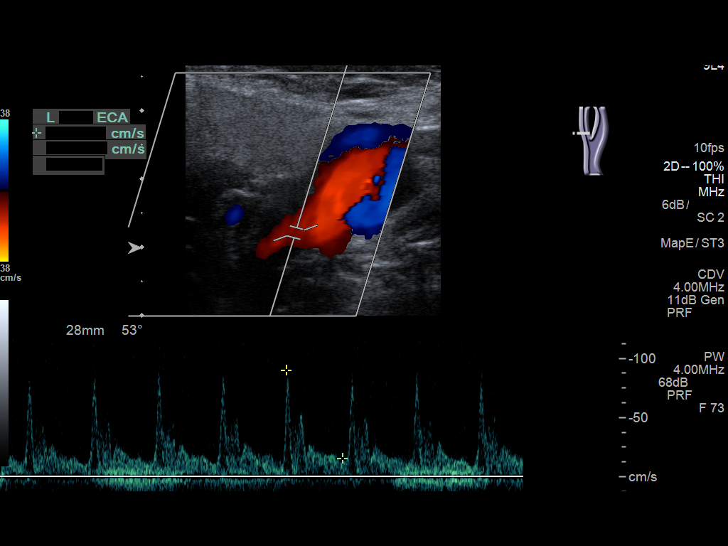
[im 62/68]
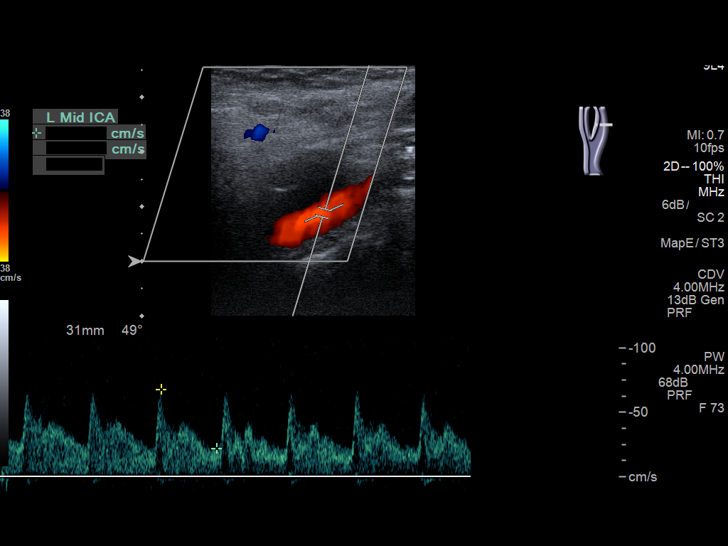
[im 68/68]
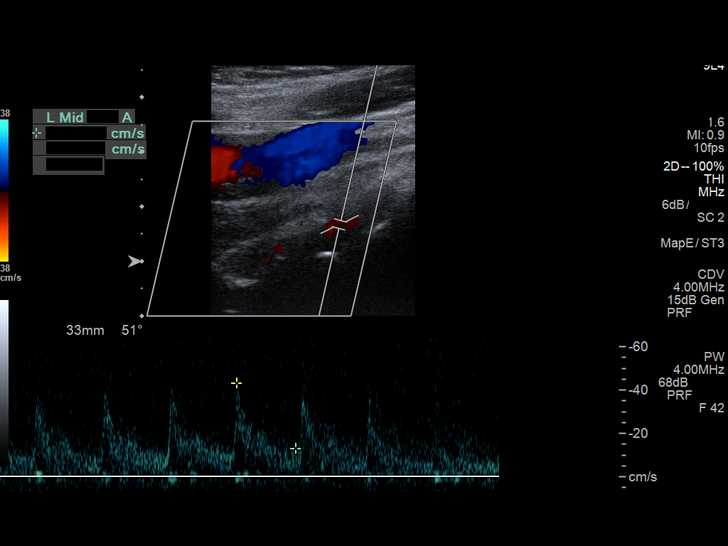

[13 of 24 positions shown; findings below may reference images not displayed]

FINDINGS: Criteria: Quantification of carotid stenosis is based on velocity
parameters that correlate the residual internal carotid diameter
with NASCET-based stenosis levels, using the diameter of the distal
internal carotid lumen as the denominator for stenosis measurement.

The following velocity measurements were obtained:

RIGHT

ICA:  87/27 cm/sec

CCA:  104/16 cm/sec

SYSTOLIC ICA/CCA RATIO:

DIASTOLIC ICA/CCA RATIO:

ECA:  87 cm/sec

LEFT

ICA:  72/20 cm/sec

CCA:  84/7 cm/sec

SYSTOLIC ICA/CCA RATIO:

DIASTOLIC ICA/CCA RATIO:

ECA:  90 cm/sec

RIGHT CAROTID ARTERY: There is a minimal amount of the center mixed
echogenic plaque within the right carotid bulb (images 16 and 19),
not resulting in elevated peak systolic velocities within the
interrogated course of the right internal carotid artery to suggest
a hemodynamically significant stenosis.

RIGHT VERTEBRAL ARTERY:  Antegrade flow

LEFT CAROTID ARTERY: There is no grayscale evidence of significant
intimal thickening or atherosclerotic plaque within the interrogated
portions of the left carotid system. There are no elevated peak
systolic velocities within the interrogated course of the left
internal carotid artery to suggest a hemodynamically significant
stenosis.

LEFT VERTEBRAL ARTERY:  Antegrade flow
IMPRESSION: 1. Minimal amount of right-sided atherosclerotic plaque, not
resulting in a hemodynamically significant stenosis.
2. Unremarkable sonographic evaluation of the left carotid system.

## 2018-03-17 ENCOUNTER — Telehealth (HOSPITAL_COMMUNITY): Payer: Self-pay | Admitting: Internal Medicine

## 2018-03-17 NOTE — Telephone Encounter (Signed)
03/17/18  pt called back and said he was doing ok and this appt was just in case he needed it

## 2018-03-17 NOTE — Telephone Encounter (Signed)
7/24  1:41 left a message asking if he would come in at 1:45 instead of 2:30... trying to work in a 90 min. lymph patient.

## 2018-03-18 ENCOUNTER — Encounter (HOSPITAL_COMMUNITY): Payer: BLUE CROSS/BLUE SHIELD | Admitting: Physical Therapy

## 2018-04-28 DIAGNOSIS — F332 Major depressive disorder, recurrent severe without psychotic features: Secondary | ICD-10-CM | POA: Diagnosis not present

## 2018-05-06 DIAGNOSIS — Z471 Aftercare following joint replacement surgery: Secondary | ICD-10-CM | POA: Diagnosis not present

## 2018-05-06 DIAGNOSIS — Z96641 Presence of right artificial hip joint: Secondary | ICD-10-CM | POA: Diagnosis not present

## 2018-05-13 DIAGNOSIS — Z471 Aftercare following joint replacement surgery: Secondary | ICD-10-CM | POA: Diagnosis not present

## 2018-05-13 DIAGNOSIS — Z96641 Presence of right artificial hip joint: Secondary | ICD-10-CM | POA: Diagnosis not present

## 2018-06-02 DIAGNOSIS — R7301 Impaired fasting glucose: Secondary | ICD-10-CM | POA: Diagnosis not present

## 2018-06-02 DIAGNOSIS — I10 Essential (primary) hypertension: Secondary | ICD-10-CM | POA: Diagnosis not present

## 2018-06-04 DIAGNOSIS — E782 Mixed hyperlipidemia: Secondary | ICD-10-CM | POA: Diagnosis not present

## 2018-06-04 DIAGNOSIS — D649 Anemia, unspecified: Secondary | ICD-10-CM | POA: Diagnosis not present

## 2018-06-04 DIAGNOSIS — I10 Essential (primary) hypertension: Secondary | ICD-10-CM | POA: Diagnosis not present

## 2018-06-04 DIAGNOSIS — R7301 Impaired fasting glucose: Secondary | ICD-10-CM | POA: Diagnosis not present

## 2018-06-22 DIAGNOSIS — F332 Major depressive disorder, recurrent severe without psychotic features: Secondary | ICD-10-CM | POA: Diagnosis not present

## 2018-07-27 DIAGNOSIS — F332 Major depressive disorder, recurrent severe without psychotic features: Secondary | ICD-10-CM | POA: Diagnosis not present

## 2018-08-05 DIAGNOSIS — M25561 Pain in right knee: Secondary | ICD-10-CM | POA: Diagnosis not present

## 2018-08-05 DIAGNOSIS — M1712 Unilateral primary osteoarthritis, left knee: Secondary | ICD-10-CM | POA: Diagnosis not present

## 2018-08-05 DIAGNOSIS — M25562 Pain in left knee: Secondary | ICD-10-CM | POA: Diagnosis not present

## 2018-08-05 DIAGNOSIS — M1711 Unilateral primary osteoarthritis, right knee: Secondary | ICD-10-CM | POA: Diagnosis not present

## 2018-09-03 DIAGNOSIS — E663 Overweight: Secondary | ICD-10-CM | POA: Diagnosis not present

## 2018-09-03 DIAGNOSIS — L03032 Cellulitis of left toe: Secondary | ICD-10-CM | POA: Diagnosis not present

## 2018-09-08 DIAGNOSIS — M1711 Unilateral primary osteoarthritis, right knee: Secondary | ICD-10-CM | POA: Diagnosis not present

## 2018-09-08 DIAGNOSIS — M1712 Unilateral primary osteoarthritis, left knee: Secondary | ICD-10-CM | POA: Diagnosis not present

## 2018-09-09 DIAGNOSIS — M12811 Other specific arthropathies, not elsewhere classified, right shoulder: Secondary | ICD-10-CM | POA: Diagnosis not present

## 2018-09-09 DIAGNOSIS — M75111 Incomplete rotator cuff tear or rupture of right shoulder, not specified as traumatic: Secondary | ICD-10-CM | POA: Diagnosis not present

## 2018-09-15 DIAGNOSIS — M170 Bilateral primary osteoarthritis of knee: Secondary | ICD-10-CM | POA: Diagnosis not present

## 2018-09-16 DIAGNOSIS — M75101 Unspecified rotator cuff tear or rupture of right shoulder, not specified as traumatic: Secondary | ICD-10-CM | POA: Diagnosis not present

## 2018-09-16 DIAGNOSIS — M12811 Other specific arthropathies, not elsewhere classified, right shoulder: Secondary | ICD-10-CM | POA: Diagnosis not present

## 2018-09-22 DIAGNOSIS — M170 Bilateral primary osteoarthritis of knee: Secondary | ICD-10-CM | POA: Diagnosis not present

## 2018-10-01 DIAGNOSIS — J060 Acute laryngopharyngitis: Secondary | ICD-10-CM | POA: Diagnosis not present

## 2018-10-01 DIAGNOSIS — E663 Overweight: Secondary | ICD-10-CM | POA: Diagnosis not present

## 2018-10-01 DIAGNOSIS — R41840 Attention and concentration deficit: Secondary | ICD-10-CM | POA: Diagnosis not present

## 2018-10-05 DIAGNOSIS — Z6827 Body mass index (BMI) 27.0-27.9, adult: Secondary | ICD-10-CM | POA: Diagnosis not present

## 2018-10-05 DIAGNOSIS — I10 Essential (primary) hypertension: Secondary | ICD-10-CM | POA: Diagnosis not present

## 2018-10-05 DIAGNOSIS — M5126 Other intervertebral disc displacement, lumbar region: Secondary | ICD-10-CM | POA: Diagnosis not present

## 2018-11-03 DIAGNOSIS — M1712 Unilateral primary osteoarthritis, left knee: Secondary | ICD-10-CM | POA: Diagnosis not present

## 2018-11-03 DIAGNOSIS — M1711 Unilateral primary osteoarthritis, right knee: Secondary | ICD-10-CM | POA: Diagnosis not present

## 2018-11-03 DIAGNOSIS — M25562 Pain in left knee: Secondary | ICD-10-CM | POA: Diagnosis not present

## 2018-11-11 DIAGNOSIS — M25562 Pain in left knee: Secondary | ICD-10-CM | POA: Diagnosis not present

## 2018-11-16 DIAGNOSIS — M84352A Stress fracture, left femur, initial encounter for fracture: Secondary | ICD-10-CM | POA: Diagnosis not present

## 2018-11-16 DIAGNOSIS — M25562 Pain in left knee: Secondary | ICD-10-CM | POA: Diagnosis not present

## 2018-11-16 DIAGNOSIS — M84362A Stress fracture, left tibia, initial encounter for fracture: Secondary | ICD-10-CM | POA: Diagnosis not present

## 2018-11-16 DIAGNOSIS — S83242D Other tear of medial meniscus, current injury, left knee, subsequent encounter: Secondary | ICD-10-CM | POA: Diagnosis not present

## 2019-02-18 NOTE — Progress Notes (Signed)
[place], [place] [address] Phone: [phone] Fax: [phone]      Your procedure is scheduled on July 1st.  Report to [facility] Main Entrance "A" at 10:45 A.M., and check in at the Admitting office.  Call this number if you have problems the morning of surgery:  [phone]  Call [phone] if you have any questions prior to your surgery date Monday-Friday 8am-4pm    Remember:  Do not eat after midnight.  You may drink clear liquids until 9:45 AM .   Clear liquids allowed are: Water, Non-Citrus Juices (without pulp), Carbonated Beverages, Clear Tea, Black Coffee Only, and Gatorade    Take these medicines the morning of surgery with A SIP OF WATER   Celexa  Lamictal  Simvastatin  7 days prior to surgery STOP taking any Aspirin (unless otherwise instructed by your surgeon), Aleve, Naproxen, Ibuprofen, Motrin, Advil, Goody's, BC's, all herbal medications, fish oil, and all vitamins.    The Morning of Surgery  Do not wear jewelry.  Do not wear lotions, powders, colognes, or deodorant  Men may shave face and neck.  Do not bring valuables to the hospital.  [facility] is not responsible for any belongings or valuables.  If you are a smoker, DO NOT Smoke 24 hours prior to surgery IF you wear a CPAP at night please bring your mask, tubing, and machine the morning of surgery   Remember that you must have someone to transport you home after your surgery, and remain with you for 24 hours if you are discharged the same day.   Contacts, glasses, hearing aids, dentures or bridgework may not be worn into surgery.    Leave your suitcase in the car.  After surgery it may be brought to your room.  For patients admitted to the hospital, discharge time will be determined by your treatment team.  Patients discharged the day of surgery will not be allowed to drive home.    Special instructions:   [facility]-  Preparing For Surgery  Before surgery, you can play an important role. Because skin is not sterile, your skin needs to be as free of germs as possible. You can reduce the number of germs on your skin by washing with CHG (chlorahexidine gluconate) Soap before surgery.  CHG is an antiseptic cleaner which kills germs and bonds with the skin to continue killing germs even after washing.    Oral Hygiene is also important to reduce your risk of infection.  Remember - BRUSH YOUR TEETH THE MORNING OF SURGERY WITH YOUR REGULAR TOOTHPASTE  Please do not use if you have an allergy to CHG or antibacterial soaps. If your skin becomes reddened/irritated stop using the CHG.  Do not shave (including legs and underarms) for at least 48 hours prior to first CHG shower. It is OK to shave your face.  Please follow these instructions carefully.   1. Shower the NIGHT BEFORE SURGERY and the MORNING OF SURGERY with CHG Soap.   2. If you chose to wash your hair, wash your hair first as usual with your normal shampoo.  3. After you shampoo, rinse your hair and body thoroughly to remove the shampoo.  4. Use CHG as you would any other liquid soap. You can apply CHG directly to the skin and wash gently with a scrungie or a clean washcloth.   5. Apply the CHG Soap to your body ONLY FROM THE  NECK DOWN.  Do not use on open wounds or open sores. Avoid contact with your eyes, ears, mouth and genitals (private parts). Wash Face and genitals (private parts)  with your normal soap.   6. Wash thoroughly, paying special attention to the area where your surgery will be performed.  7. Thoroughly rinse your body with warm water from the neck down.  8. DO NOT shower/wash with your normal soap after using and rinsing off the CHG Soap.  9. Pat yourself dry with a CLEAN TOWEL.  10. Wear CLEAN PAJAMAS to bed the night before surgery, wear comfortable clothes the morning of surgery  11. Place CLEAN SHEETS on your bed the night of  your first shower and DO NOT SLEEP WITH PETS.    Day of Surgery:  Do not apply any deodorants/lotions.  Please wear clean clothes to the hospital/surgery center.   Remember to brush your teeth WITH YOUR REGULAR TOOTHPASTE.   Please read over the following fact sheets that you were given.        '

## 2019-02-21 ENCOUNTER — Encounter (HOSPITAL_COMMUNITY): Payer: Self-pay

## 2019-02-21 ENCOUNTER — Encounter (HOSPITAL_COMMUNITY)
Admission: RE | Admit: 2019-02-21 | Discharge: 2019-02-21 | Disposition: A | Discharge status: Home / Self Care | Payer: No Typology Code available for payment source | Source: Ambulatory Visit | Attending: Orthopedic Surgery | Admitting: Orthopedic Surgery

## 2019-02-21 ENCOUNTER — Other Ambulatory Visit: Payer: Self-pay

## 2019-02-21 ENCOUNTER — Other Ambulatory Visit (HOSPITAL_COMMUNITY)
Admission: RE | Admit: 2019-02-21 | Discharge: 2019-02-21 | Disposition: A | Discharge status: Home / Self Care | Payer: No Typology Code available for payment source | Source: Ambulatory Visit | Attending: Orthopedic Surgery | Admitting: Orthopedic Surgery

## 2019-02-21 DIAGNOSIS — E7800 Pure hypercholesterolemia, unspecified: Secondary | ICD-10-CM | POA: Diagnosis not present

## 2019-02-21 DIAGNOSIS — M75102 Unspecified rotator cuff tear or rupture of left shoulder, not specified as traumatic: Secondary | ICD-10-CM | POA: Diagnosis not present

## 2019-02-21 DIAGNOSIS — Z1159 Encounter for screening for other viral diseases: Secondary | ICD-10-CM | POA: Diagnosis not present

## 2019-02-21 DIAGNOSIS — F329 Major depressive disorder, single episode, unspecified: Secondary | ICD-10-CM | POA: Diagnosis not present

## 2019-02-21 DIAGNOSIS — Z79899 Other long term (current) drug therapy: Secondary | ICD-10-CM | POA: Diagnosis not present

## 2019-02-21 DIAGNOSIS — M12812 Other specific arthropathies, not elsewhere classified, left shoulder: Secondary | ICD-10-CM | POA: Diagnosis not present

## 2019-02-21 DIAGNOSIS — M25512 Pain in left shoulder: Secondary | ICD-10-CM | POA: Diagnosis present

## 2019-02-21 DIAGNOSIS — I10 Essential (primary) hypertension: Secondary | ICD-10-CM | POA: Diagnosis not present

## 2019-02-21 DIAGNOSIS — G4730 Sleep apnea, unspecified: Secondary | ICD-10-CM | POA: Diagnosis not present

## 2019-02-21 DIAGNOSIS — Z7982 Long term (current) use of aspirin: Secondary | ICD-10-CM | POA: Diagnosis not present

## 2019-02-21 LAB — BASIC METABOLIC PANEL
Anion gap: 11 (ref 5–15)
BUN: 29 mg/dL — ABNORMAL HIGH (ref 8–23)
CO2: 20 mmol/L — ABNORMAL LOW (ref 22–32)
Calcium: 9.6 mg/dL (ref 8.9–10.3)
Chloride: 108 mmol/L (ref 98–111)
Creatinine, Ser: 1.08 mg/dL (ref 0.61–1.24)
GFR calc Af Amer: >60 mL/min (ref >60)
GFR calc non Af Amer: >60 mL/min (ref >60)
Glucose, Bld: 105 mg/dL — ABNORMAL HIGH (ref 70–99)
Potassium: 4.5 mmol/L (ref 3.5–5.1)
Sodium: 139 mmol/L (ref 135–145)

## 2019-02-21 LAB — SARS CORONAVIRUS 2 (TAT 6-24 HRS): SARS Coronavirus 2: NEGATIVE

## 2019-02-21 LAB — SURGICAL PCR SCREEN
MRSA, PCR: POSITIVE — AB
Staphylococcus aureus: POSITIVE — AB

## 2019-02-21 NOTE — Progress Notes (Signed)
Patient will need Redraw CBC day of surgery due to not enough blood in the tube

## 2019-02-21 NOTE — Progress Notes (Signed)
[place], [place] [address] Phone: [phone] Fax: [phone]      Your procedure is scheduled on July 1st.  Report to [facility] Main Entrance "A" at 10:45 A.M., and check in at the Admitting office.  Call this number if you have problems the morning of surgery:  [phone]  Call [phone] if you have any questions prior to your surgery date Monday-Friday 8am-4pm    Remember:  Do not eat after midnight.  You may drink clear liquids until 9:45 AM .   Clear liquids allowed are: Water, Non-Citrus Juices (without pulp), Carbonated Beverages, Clear Tea, Black Coffee Only, and Gatorade    Take these medicines the morning of surgery with A SIP OF WATER  citalopram (CELEXA) lamoTRIgine (LAMICTAL)  7 days prior to surgery STOP taking any Aspirin (unless otherwise instructed by your surgeon), Aleve, Naproxen, Ibuprofen, Motrin, Advil, Goody's, BC's, all herbal medications, fish oil, and all vitamins.    The Morning of Surgery  Do not wear jewelry.  Do not wear lotions, powders, colognes, or deodorant  Men may shave face and neck.  Do not bring valuables to the hospital.  [facility] is not responsible for any belongings or valuables.  If you are a smoker, DO NOT Smoke 24 hours prior to surgery IF you wear a CPAP at night please bring your mask, tubing, and machine the morning of surgery   Remember that you must have someone to transport you home after your surgery, and remain with you for 24 hours if you are discharged the same day.   Contacts, glasses, hearing aids, dentures or bridgework may not be worn into surgery.    Leave your suitcase in the car.  After surgery it may be brought to your room.  For patients admitted to the hospital, discharge time will be determined by your treatment team.  Patients discharged the day of surgery will not be allowed to drive home.    Special instructions:    [facility]- Preparing For Surgery  Before surgery, you can play an important role. Because skin is not sterile, your skin needs to be as free of germs as possible. You can reduce the number of germs on your skin by washing with CHG (chlorahexidine gluconate) Soap before surgery.  CHG is an antiseptic cleaner which kills germs and bonds with the skin to continue killing germs even after washing.    Oral Hygiene is also important to reduce your risk of infection.  Remember - BRUSH YOUR TEETH THE MORNING OF SURGERY WITH YOUR REGULAR TOOTHPASTE  Please do not use if you have an allergy to CHG or antibacterial soaps. If your skin becomes reddened/irritated stop using the CHG.  Do not shave (including legs and underarms) for at least 48 hours prior to first CHG shower. It is OK to shave your face.  Please follow these instructions carefully.   1. Shower the NIGHT BEFORE SURGERY and the MORNING OF SURGERY with CHG Soap.   2. If you chose to wash your hair, wash your hair first as usual with your normal shampoo.  3. After you shampoo, rinse your hair and body thoroughly to remove the shampoo.  4. Use CHG as you would any other liquid soap. You can apply CHG directly to the skin and wash gently with a scrungie or a clean washcloth.   5. Apply the CHG Soap to your body ONLY FROM THE NECK DOWN.  Do not use on open wounds or open sores. Avoid contact with your eyes, ears, mouth and genitals (private parts). Wash Face and genitals (private parts)  with your normal soap.   6. Wash thoroughly, paying special attention to the area where your surgery will be performed.  7. Thoroughly rinse your body with warm water from the neck down.  8. DO NOT shower/wash with your normal soap after using and rinsing off the CHG Soap.  9. Pat yourself dry with a CLEAN TOWEL.  10. Wear CLEAN PAJAMAS to bed the night before surgery, wear comfortable clothes the morning of surgery  11. Place CLEAN SHEETS on your bed  the night of your first shower and DO NOT SLEEP WITH PETS.    Day of Surgery:  Do not apply any deodorants/lotions.  Please wear clean clothes to the hospital/surgery center.   Remember to brush your teeth WITH YOUR REGULAR TOOTHPASTE.   Please read over the following fact sheets that you were given.        '

## 2019-02-21 NOTE — Progress Notes (Signed)
PCP - Celene Squibb  Cardiologist - denies  Chest x-ray - not needee EKG - 02/21/19 Stress Test - denies ECHO - denies Cardiac Cath - denies  Sleep Study - >5 years patient stated that "his was not severe enough to need a CPAP"  Anesthesia review: No  Patient denies shortness of breath, fever, cough and chest pain at PAT appointment   Patient verbalized understanding of instructions that were given to them at the PAT appointment. Patient was also instructed that they will need to review over the PAT instructions again at home before surgery.

## 2019-02-22 MED ORDER — TRANEXAMIC ACID-NACL 1000-0.7 MG/100ML-% IV SOLN: 1000.0000 mg | INTRAVENOUS | Status: DC

## 2019-02-22 NOTE — Progress Notes (Signed)
Request from Dr. Stann Mainland to add additional Vancomycin 1g with the Ancef 2 the day of surgery.  Orders placed

## 2019-02-23 ENCOUNTER — Encounter (HOSPITAL_COMMUNITY): Payer: Self-pay

## 2019-02-23 ENCOUNTER — Other Ambulatory Visit: Payer: Self-pay

## 2019-02-23 ENCOUNTER — Ambulatory Visit (HOSPITAL_COMMUNITY): Payer: No Typology Code available for payment source | Admitting: Anesthesiology

## 2019-02-23 ENCOUNTER — Encounter (HOSPITAL_COMMUNITY)
Admission: AD | Disposition: A | Discharge status: Home / Self Care | Payer: Self-pay | Source: Home / Self Care | Attending: Orthopedic Surgery

## 2019-02-23 ENCOUNTER — Observation Stay (HOSPITAL_COMMUNITY)
Admission: AD | Admit: 2019-02-23 | Discharge: 2019-02-24 | Disposition: A | Discharge status: Home / Self Care | Payer: No Typology Code available for payment source | Attending: Orthopedic Surgery | Admitting: Orthopedic Surgery

## 2019-02-23 ENCOUNTER — Inpatient Hospital Stay (HOSPITAL_COMMUNITY): Payer: No Typology Code available for payment source

## 2019-02-23 DIAGNOSIS — Z96612 Presence of left artificial shoulder joint: Secondary | ICD-10-CM

## 2019-02-23 DIAGNOSIS — I10 Essential (primary) hypertension: Secondary | ICD-10-CM | POA: Insufficient documentation

## 2019-02-23 DIAGNOSIS — M12812 Other specific arthropathies, not elsewhere classified, left shoulder: Secondary | ICD-10-CM | POA: Diagnosis present

## 2019-02-23 DIAGNOSIS — E7800 Pure hypercholesterolemia, unspecified: Secondary | ICD-10-CM | POA: Insufficient documentation

## 2019-02-23 DIAGNOSIS — M75102 Unspecified rotator cuff tear or rupture of left shoulder, not specified as traumatic: Principal | ICD-10-CM | POA: Insufficient documentation

## 2019-02-23 DIAGNOSIS — Z79899 Other long term (current) drug therapy: Secondary | ICD-10-CM | POA: Insufficient documentation

## 2019-02-23 DIAGNOSIS — Z7982 Long term (current) use of aspirin: Secondary | ICD-10-CM | POA: Insufficient documentation

## 2019-02-23 DIAGNOSIS — F329 Major depressive disorder, single episode, unspecified: Secondary | ICD-10-CM | POA: Insufficient documentation

## 2019-02-23 DIAGNOSIS — Z1159 Encounter for screening for other viral diseases: Secondary | ICD-10-CM | POA: Insufficient documentation

## 2019-02-23 DIAGNOSIS — G4730 Sleep apnea, unspecified: Secondary | ICD-10-CM | POA: Insufficient documentation

## 2019-02-23 HISTORY — PX: REVERSE SHOULDER ARTHROPLASTY: SHX5054

## 2019-02-23 LAB — CBC
HCT: 39.4 % (ref 39.0–52.0)
Hemoglobin: 12.6 g/dL — ABNORMAL LOW (ref 13.0–17.0)
MCH: 30.4 pg (ref 26.0–34.0)
MCHC: 32 g/dL (ref 30.0–36.0)
MCV: 94.9 fL (ref 80.0–100.0)
Platelets: 296 10*3/uL (ref 150–400)
RBC: 4.15 MIL/uL — ABNORMAL LOW (ref 4.22–5.81)
RDW: 13.1 % (ref 11.5–15.5)
WBC: 4.7 10*3/uL (ref 4.0–10.5)
nRBC: 0 % (ref 0.0–0.2)

## 2019-02-23 SURGERY — ARTHROPLASTY, SHOULDER, TOTAL, REVERSE
Anesthesia: General | Site: Shoulder | Laterality: Left

## 2019-02-23 MED ORDER — HYDROCODONE-ACETAMINOPHEN 7.5-325 MG PO TABS
1.0000 | ORAL_TABLET | ORAL | Status: DC | PRN
  Administered 2019-02-24 (×2): 2 via ORAL
  Filled 2019-02-23 (×2): qty 2

## 2019-02-23 MED ORDER — TRANEXAMIC ACID-NACL 1000-0.7 MG/100ML-% IV SOLN
1000.0000 mg | INTRAVENOUS | Status: AC
  Administered 2019-02-23: 1000 mg via INTRAVENOUS
  Filled 2019-02-23 (×2): qty 100

## 2019-02-23 MED ORDER — MENTHOL 3 MG MT LOZG: 1.0000 | LOZENGE | OROMUCOSAL | Status: DC | PRN

## 2019-02-23 MED ORDER — CHLORHEXIDINE GLUCONATE CLOTH 2 % EX PADS: 6.0000 | MEDICATED_PAD | Freq: Every day | CUTANEOUS | Status: DC

## 2019-02-23 MED ORDER — MIDAZOLAM HCL 2 MG/2ML IJ SOLN
INTRAMUSCULAR | Status: AC
  Administered 2019-02-23: 13:00:00 2 mg via INTRAVENOUS
  Filled 2019-02-23: qty 2

## 2019-02-23 MED ORDER — ONDANSETRON HCL 4 MG/2ML IJ SOLN
INTRAMUSCULAR | Status: DC | PRN
  Administered 2019-02-23: 4 mg via INTRAVENOUS

## 2019-02-23 MED ORDER — DEXAMETHASONE SODIUM PHOSPHATE 10 MG/ML IJ SOLN
INTRAMUSCULAR | Status: DC | PRN
  Administered 2019-02-23: 10 mg via INTRAVENOUS

## 2019-02-23 MED ORDER — FENTANYL CITRATE (PF) 100 MCG/2ML IJ SOLN
INTRAMUSCULAR | Status: AC
  Administered 2019-02-23: 13:00:00 50 ug via INTRAVENOUS
  Filled 2019-02-23: qty 2

## 2019-02-23 MED ORDER — LAMOTRIGINE 100 MG PO TABS
150.0000 mg | ORAL_TABLET | Freq: Two times a day (BID) | ORAL | Status: DC
  Administered 2019-02-23 – 2019-02-24 (×2): 150 mg via ORAL
  Filled 2019-02-23 (×2): qty 2

## 2019-02-23 MED ORDER — METOCLOPRAMIDE HCL 5 MG PO TABS: 5.0000 mg | ORAL_TABLET | Freq: Three times a day (TID) | ORAL | Status: DC | PRN

## 2019-02-23 MED ORDER — CITALOPRAM HYDROBROMIDE 20 MG PO TABS
20.0000 mg | ORAL_TABLET | Freq: Every day | ORAL | Status: DC
  Administered 2019-02-23: 20 mg via ORAL
  Filled 2019-02-23: qty 1

## 2019-02-23 MED ORDER — LIDOCAINE 2% (20 MG/ML) 5 ML SYRINGE
INTRAMUSCULAR | Status: DC | PRN
  Administered 2019-02-23: 60 mg via INTRAVENOUS

## 2019-02-23 MED ORDER — LACTATED RINGERS IV SOLN
INTRAVENOUS | Status: DC
  Administered 2019-02-23 (×2): via INTRAVENOUS

## 2019-02-23 MED ORDER — TRANEXAMIC ACID-NACL 1000-0.7 MG/100ML-% IV SOLN
1000.0000 mg | Freq: Once | INTRAVENOUS | Status: AC
  Administered 2019-02-23: 1000 mg via INTRAVENOUS
  Filled 2019-02-23: qty 100

## 2019-02-23 MED ORDER — ACETAMINOPHEN 325 MG PO TABS: 325.0000 mg | ORAL_TABLET | Freq: Four times a day (QID) | ORAL | Status: DC | PRN

## 2019-02-23 MED ORDER — OXYCODONE HCL 5 MG/5ML PO SOLN: 5.0000 mg | Freq: Once | ORAL | Status: DC | PRN

## 2019-02-23 MED ORDER — MUPIROCIN 2 % EX OINT
1.0000 "application " | TOPICAL_OINTMENT | Freq: Two times a day (BID) | CUTANEOUS | Status: DC
  Administered 2019-02-23 – 2019-02-24 (×2): 1 via NASAL
  Filled 2019-02-23: qty 22

## 2019-02-23 MED ORDER — VANCOMYCIN HCL IN DEXTROSE 1-5 GM/200ML-% IV SOLN
1000.0000 mg | Freq: Once | INTRAVENOUS | Status: AC
  Administered 2019-02-23: 1000 mg via INTRAVENOUS
  Filled 2019-02-23: qty 200

## 2019-02-23 MED ORDER — PROMETHAZINE HCL 25 MG/ML IJ SOLN: 6.2500 mg | INTRAMUSCULAR | Status: DC | PRN

## 2019-02-23 MED ORDER — ONDANSETRON HCL 4 MG/2ML IJ SOLN: 4.0000 mg | Freq: Four times a day (QID) | INTRAMUSCULAR | Status: DC | PRN

## 2019-02-23 MED ORDER — METOCLOPRAMIDE HCL 5 MG/ML IJ SOLN: 5.0000 mg | Freq: Three times a day (TID) | INTRAMUSCULAR | Status: DC | PRN

## 2019-02-23 MED ORDER — MIDAZOLAM HCL 2 MG/2ML IJ SOLN
2.0000 mg | Freq: Once | INTRAMUSCULAR | Status: AC
  Administered 2019-02-23: 13:00:00 2 mg via INTRAVENOUS

## 2019-02-23 MED ORDER — ROCURONIUM BROMIDE 10 MG/ML (PF) SYRINGE
PREFILLED_SYRINGE | INTRAVENOUS | Status: DC | PRN
  Administered 2019-02-23: 60 mg via INTRAVENOUS
  Administered 2019-02-23: 40 mg via INTRAVENOUS

## 2019-02-23 MED ORDER — LISINOPRIL-HYDROCHLOROTHIAZIDE 10-12.5 MG PO TABS: 1.0000 | ORAL_TABLET | Freq: Every morning | ORAL | Status: DC

## 2019-02-23 MED ORDER — ASPIRIN EC 81 MG PO TBEC
81.0000 mg | DELAYED_RELEASE_TABLET | Freq: Every morning | ORAL | Status: DC
  Administered 2019-02-24: 81 mg via ORAL
  Filled 2019-02-23: qty 1

## 2019-02-23 MED ORDER — MIDAZOLAM HCL 5 MG/5ML IJ SOLN
INTRAMUSCULAR | Status: DC | PRN
  Administered 2019-02-23: 2 mg via INTRAVENOUS

## 2019-02-23 MED ORDER — BUPIVACAINE LIPOSOME 1.3 % IJ SUSP
INTRAMUSCULAR | Status: DC | PRN
  Administered 2019-02-23: 10 mL via PERINEURAL

## 2019-02-23 MED ORDER — FENOFIBRATE 54 MG PO TABS
54.0000 mg | ORAL_TABLET | Freq: Every day | ORAL | Status: DC
  Administered 2019-02-23 – 2019-02-24 (×2): 54 mg via ORAL
  Filled 2019-02-23 (×2): qty 1

## 2019-02-23 MED ORDER — FENTANYL CITRATE (PF) 250 MCG/5ML IJ SOLN
INTRAMUSCULAR | Status: DC | PRN
  Administered 2019-02-23: 50 ug via INTRAVENOUS

## 2019-02-23 MED ORDER — MIDAZOLAM HCL 2 MG/2ML IJ SOLN
INTRAMUSCULAR | Status: AC
  Filled 2019-02-23: qty 2

## 2019-02-23 MED ORDER — LISDEXAMFETAMINE DIMESYLATE 20 MG PO CAPS: 40.0000 mg | ORAL_CAPSULE | Freq: Every day | ORAL | Status: DC | PRN

## 2019-02-23 MED ORDER — PROPOFOL 10 MG/ML IV BOLUS
INTRAVENOUS | Status: DC | PRN
  Administered 2019-02-23: 160 mg via INTRAVENOUS

## 2019-02-23 MED ORDER — EPHEDRINE SULFATE 50 MG/ML IJ SOLN
INTRAMUSCULAR | Status: DC | PRN
  Administered 2019-02-23 (×2): 5 mg via INTRAVENOUS

## 2019-02-23 MED ORDER — BUPIVACAINE-EPINEPHRINE (PF) 0.25% -1:200000 IJ SOLN
INTRAMUSCULAR | Status: AC
  Filled 2019-02-23: qty 30

## 2019-02-23 MED ORDER — CEFAZOLIN SODIUM-DEXTROSE 1-4 GM/50ML-% IV SOLN
1.0000 g | Freq: Four times a day (QID) | INTRAVENOUS | Status: AC
  Administered 2019-02-23 – 2019-02-24 (×3): 1 g via INTRAVENOUS
  Filled 2019-02-23 (×3): qty 50

## 2019-02-23 MED ORDER — FENTANYL CITRATE (PF) 100 MCG/2ML IJ SOLN
50.0000 ug | Freq: Once | INTRAMUSCULAR | Status: AC
  Administered 2019-02-23: 50 ug via INTRAVENOUS

## 2019-02-23 MED ORDER — PHENOL 1.4 % MT LIQD: 1.0000 | OROMUCOSAL | Status: DC | PRN

## 2019-02-23 MED ORDER — BUPIVACAINE HCL (PF) 0.5 % IJ SOLN
INTRAMUSCULAR | Status: DC | PRN
  Administered 2019-02-23: 15 mL via PERINEURAL

## 2019-02-23 MED ORDER — MORPHINE SULFATE (PF) 2 MG/ML IV SOLN: 0.5000 mg | INTRAVENOUS | Status: DC | PRN

## 2019-02-23 MED ORDER — NAPROXEN 250 MG PO TABS
250.0000 mg | ORAL_TABLET | Freq: Two times a day (BID) | ORAL | Status: DC
  Administered 2019-02-23 – 2019-02-24 (×2): 250 mg via ORAL
  Filled 2019-02-23 (×2): qty 1

## 2019-02-23 MED ORDER — DOCUSATE SODIUM 100 MG PO CAPS
100.0000 mg | ORAL_CAPSULE | Freq: Two times a day (BID) | ORAL | Status: DC
  Administered 2019-02-23 – 2019-02-24 (×2): 100 mg via ORAL
  Filled 2019-02-23 (×2): qty 1

## 2019-02-23 MED ORDER — LISINOPRIL 10 MG PO TABS
10.0000 mg | ORAL_TABLET | Freq: Every day | ORAL | Status: DC
  Filled 2019-02-23: qty 1

## 2019-02-23 MED ORDER — FENTANYL CITRATE (PF) 100 MCG/2ML IJ SOLN: 25.0000 ug | INTRAMUSCULAR | Status: DC | PRN

## 2019-02-23 MED ORDER — CEFAZOLIN SODIUM-DEXTROSE 2-4 GM/100ML-% IV SOLN
2.0000 g | INTRAVENOUS | Status: AC
  Administered 2019-02-23: 2 g via INTRAVENOUS
  Filled 2019-02-23: qty 100

## 2019-02-23 MED ORDER — OXYCODONE HCL 5 MG PO TABS: 5.0000 mg | ORAL_TABLET | Freq: Once | ORAL | Status: DC | PRN

## 2019-02-23 MED ORDER — SIMVASTATIN 20 MG PO TABS
40.0000 mg | ORAL_TABLET | Freq: Every evening | ORAL | Status: DC
  Administered 2019-02-23: 40 mg via ORAL
  Filled 2019-02-23: qty 2

## 2019-02-23 MED ORDER — ACETAMINOPHEN 500 MG PO TABS
500.0000 mg | ORAL_TABLET | Freq: Four times a day (QID) | ORAL | Status: DC
  Administered 2019-02-23: 500 mg via ORAL
  Filled 2019-02-23 (×2): qty 1

## 2019-02-23 MED ORDER — SODIUM CHLORIDE 0.9 % IR SOLN
Status: DC | PRN
  Administered 2019-02-23: 3000 mL

## 2019-02-23 MED ORDER — HYDROCODONE-ACETAMINOPHEN 5-325 MG PO TABS
1.0000 | ORAL_TABLET | ORAL | Status: DC | PRN
  Administered 2019-02-23: 2 via ORAL
  Filled 2019-02-23: qty 2

## 2019-02-23 MED ORDER — ONDANSETRON HCL 4 MG PO TABS: 4.0000 mg | ORAL_TABLET | Freq: Four times a day (QID) | ORAL | Status: DC | PRN

## 2019-02-23 MED ORDER — FENTANYL CITRATE (PF) 250 MCG/5ML IJ SOLN
INTRAMUSCULAR | Status: AC
  Filled 2019-02-23: qty 5

## 2019-02-23 MED ORDER — CHLORHEXIDINE GLUCONATE 4 % EX LIQD: 60.0000 mL | Freq: Once | CUTANEOUS | Status: DC

## 2019-02-23 MED ORDER — 0.9 % SODIUM CHLORIDE (POUR BTL) OPTIME
TOPICAL | Status: DC | PRN
  Administered 2019-02-23: 1000 mL

## 2019-02-23 MED ORDER — VANCOMYCIN HCL 1000 MG IV SOLR
INTRAVENOUS | Status: DC | PRN
  Administered 2019-02-23: 1000 mg

## 2019-02-23 MED ORDER — HYDROCHLOROTHIAZIDE 12.5 MG PO CAPS
12.5000 mg | ORAL_CAPSULE | Freq: Every day | ORAL | Status: DC
  Filled 2019-02-23: qty 1

## 2019-02-23 MED ORDER — VANCOMYCIN HCL 1000 MG IV SOLR
INTRAVENOUS | Status: AC
  Filled 2019-02-23: qty 1000

## 2019-02-23 MED ORDER — SODIUM CHLORIDE 0.9 % IV SOLN
INTRAVENOUS | Status: DC | PRN
  Administered 2019-02-23: 25 ug/min via INTRAVENOUS

## 2019-02-23 SURGICAL SUPPLY — 70 items
AID PSTN UNV HD RSTRNT DISP (MISCELLANEOUS) ×1
ALCOHOL 70% 16 OZ (MISCELLANEOUS) ×2 IMPLANT
BASEPLATE GLENOSPHERE 25 (Plate) ×1 IMPLANT
BEARING HUMERAL 40 STD VITE (Joint) ×1 IMPLANT
BIT DRILL 5/64X5 DISP (BIT) ×2 IMPLANT
BIT DRILL TWIST 2.7 (BIT) ×1 IMPLANT
BLADE SAG 18X100X1.27 (BLADE) ×2 IMPLANT
BRNG HUM STD 40 RVRS SHLDR (Joint) ×1 IMPLANT
COVER SURGICAL LIGHT HANDLE (MISCELLANEOUS) ×2 IMPLANT
COVER WAND RF STERILE (DRAPES) ×2 IMPLANT
DRAPE IMP U-DRAPE 54X76 (DRAPES) ×4 IMPLANT
DRAPE INCISE IOBAN 66X45 STRL (DRAPES) ×2 IMPLANT
DRAPE ORTHO SPLIT 77X108 STRL (DRAPES) ×4
DRAPE SURG 17X23 STRL (DRAPES) ×2 IMPLANT
DRAPE SURG ORHT 6 SPLT 77X108 (DRAPES) ×2 IMPLANT
DRAPE U-SHAPE 47X51 STRL (DRAPES) ×2 IMPLANT
DRSG AQUACEL AG ADV 3.5X10 (GAUZE/BANDAGES/DRESSINGS) ×2 IMPLANT
DURAPREP 26ML APPLICATOR (WOUND CARE) ×2 IMPLANT
ELECT BLADE 4.0 EZ CLEAN MEGAD (MISCELLANEOUS) ×2
ELECT REM PT RETURN 9FT ADLT (ELECTROSURGICAL) ×2
ELECTRODE BLDE 4.0 EZ CLN MEGD (MISCELLANEOUS) ×1 IMPLANT
ELECTRODE REM PT RTRN 9FT ADLT (ELECTROSURGICAL) ×1 IMPLANT
GLENOSPHERE VERSADIAL 40 +3 (Joint) ×1 IMPLANT
GLOVE BIO SURGEON STRL SZ7.5 (GLOVE) ×3 IMPLANT
GLOVE BIOGEL PI IND STRL 8 (GLOVE) ×1 IMPLANT
GLOVE BIOGEL PI INDICATOR 8 (GLOVE) ×1
GOWN STRL REUS W/ TWL LRG LVL3 (GOWN DISPOSABLE) ×1 IMPLANT
GOWN STRL REUS W/ TWL XL LVL3 (GOWN DISPOSABLE) ×1 IMPLANT
GOWN STRL REUS W/TWL LRG LVL3 (GOWN DISPOSABLE) ×2
GOWN STRL REUS W/TWL XL LVL3 (GOWN DISPOSABLE) ×4
KIT BASIN OR (CUSTOM PROCEDURE TRAY) ×2 IMPLANT
KIT TURNOVER KIT B (KITS) ×2 IMPLANT
MANIFOLD NEPTUNE II (INSTRUMENTS) ×2 IMPLANT
NDL 1/2 CIR MAYO (NEEDLE) ×1 IMPLANT
NDL HYPO 25GX1X1/2 BEV (NEEDLE) ×1 IMPLANT
NEEDLE 1/2 CIR MAYO (NEEDLE) ×2 IMPLANT
NEEDLE HYPO 25GX1X1/2 BEV (NEEDLE) ×2 IMPLANT
NS IRRIG 1000ML POUR BTL (IV SOLUTION) ×2 IMPLANT
PACK SHOULDER (CUSTOM PROCEDURE TRAY) ×2 IMPLANT
PAD ARMBOARD 7.5X6 YLW CONV (MISCELLANEOUS) ×4 IMPLANT
PIN THREADED REVERSE (PIN) ×1 IMPLANT
RESTRAINT HEAD UNIVERSAL NS (MISCELLANEOUS) ×2 IMPLANT
SCREW BONE LOCKING 4.75X35X3.5 (Screw) ×1 IMPLANT
SCREW BONE STRL 6.5MMX25MM (Screw) ×1 IMPLANT
SCREW LOCKING 4.75MMX15MM (Screw) ×2 IMPLANT
SCREW LOCKING STRL 4.75X25X3.5 (Screw) ×1 IMPLANT
SLING ARM IMMOBILIZER LRG (SOFTGOODS) ×1 IMPLANT
SLING ARM IMMOBILIZER MED (SOFTGOODS) IMPLANT
SPONGE LAP 18X18 RF (DISPOSABLE) ×1 IMPLANT
SPONGE LAP 4X18 RFD (DISPOSABLE) ×2 IMPLANT
STEM HUMERAL STRL 11MMX83MM (Stem) ×1 IMPLANT
STRIP CLOSURE SKIN 1/2X4 (GAUZE/BANDAGES/DRESSINGS) ×2 IMPLANT
SUCTION FRAZIER HANDLE 10FR (MISCELLANEOUS) ×1
SUCTION TUBE FRAZIER 10FR DISP (MISCELLANEOUS) ×1 IMPLANT
SUT FIBERWIRE #2 38 T-5 BLUE (SUTURE) ×2
SUT MNCRL AB 4-0 PS2 18 (SUTURE) ×2 IMPLANT
SUT SILK 2 0 TIES 17X18 (SUTURE) ×2
SUT SILK 2-0 18XBRD TIE BLK (SUTURE) IMPLANT
SUT VIC AB 1 CT1 27 (SUTURE)
SUT VIC AB 1 CT1 27XBRD ANBCTR (SUTURE) IMPLANT
SUT VIC AB 2-0 CT1 27 (SUTURE) ×4
SUT VIC AB 2-0 CT1 TAPERPNT 27 (SUTURE) ×1 IMPLANT
SUTURE FIBERWR #2 38 T-5 BLUE (SUTURE) ×1 IMPLANT
SYR CONTROL 10ML LL (SYRINGE) ×2 IMPLANT
TOWEL GREEN STERILE (TOWEL DISPOSABLE) ×2 IMPLANT
TOWEL GREEN STERILE FF (TOWEL DISPOSABLE) ×2 IMPLANT
TOWER CARTRIDGE SMART MIX (DISPOSABLE) IMPLANT
TRAY HUM MINI SHOULDER +0 40D (Shoulder) ×1 IMPLANT
WATER STERILE IRR 1000ML POUR (IV SOLUTION) ×2 IMPLANT
YANKAUER SUCT BULB TIP NO VENT (SUCTIONS) ×2 IMPLANT

## 2019-02-23 NOTE — Brief Op Note (Signed)
02/23/2019  3:11 PM  PATIENT:  Jacob Arellano  63 y.o. male  PRE-OPERATIVE DIAGNOSIS:  Left shoulder rotator cuff arthropathy  POST-OPERATIVE DIAGNOSIS:  Left shoulder rotator cuff arthropathy  PROCEDURE:  Procedure(s) with comments: REVERSE SHOULDER ARTHROPLASTY (Left) - 170mins  SURGEON:  Surgeon(s) and Role:    * Stann Mainland, Elly Modena, MD - Primary  PHYSICIAN ASSISTANT:   ASSISTANTS: Katy Apo, RNFA   ANESTHESIA:   regional and general  EBL:  300 mL   BLOOD ADMINISTERED:none  DRAINS: none   LOCAL MEDICATIONS USED:  NONE  SPECIMEN:  No Specimen  DISPOSITION OF SPECIMEN:  N/A  COUNTS:  YES  TOURNIQUET:  * No tourniquets in log *  DICTATION: .Note written in EPIC  PLAN OF CARE: Admit to inpatient   PATIENT DISPOSITION:  PACU - hemodynamically stable.   Delay start of Pharmacological VTE agent (>24hrs) due to surgical blood loss or risk of bleeding: not applicable

## 2019-02-23 NOTE — Anesthesia Procedure Notes (Signed)
Procedure Name: Intubation Date/Time: 02/23/2019 1:37 PM Performed by: Mariea Clonts, CRNA Pre-anesthesia Checklist: Patient identified, Emergency Drugs available, Suction available and Patient being monitored Patient Re-evaluated:Patient Re-evaluated prior to induction Oxygen Delivery Method: Circle System Utilized Preoxygenation: Pre-oxygenation with 100% oxygen Induction Type: IV induction Ventilation: Mask ventilation without difficulty Laryngoscope Size: Glidescope and 4 Grade View: Grade I Tube type: Oral Tube size: 8.0 mm Number of attempts: 3 Airway Equipment and Method: Stylet,  Oral airway and Video-laryngoscopy Placement Confirmation: ETT inserted through vocal cords under direct vision,  positive ETCO2 and breath sounds checked- equal and bilateral Tube secured with: Tape Dental Injury: Teeth and Oropharynx as per pre-operative assessment  Difficulty Due To: Difficulty was unanticipated and Difficult Airway- due to anterior larynx Future Recommendations: Recommend- induction with short-acting agent, and alternative techniques readily available Comments: Grade 3 view with miller 3.

## 2019-02-23 NOTE — Progress Notes (Signed)
Pt arrived to room, pt given cell phone, call bell within reach. RN asked pt if there was anyone I could call for him he stated no that he was going to text his sister.

## 2019-02-23 NOTE — H&P (Signed)
ORTHOPAEDIC H and P  REQUESTING PHYSICIAN: Nicholes Stairs, MD  PCP:  Celene Squibb, MD  Chief Complaint: Left shoulder rotator cuff arthropathy  HPI: Jacob Arellano is a 63 y.o. male who complains of chronic left shoulder pain related to a massive retracted rotator cuff tear and subsequent rotator cuff arthropathy.  He presents today for reverse shoulder arthroplasty for definitive treatment.  No new complaints today.  No recent changes in his medical history.  Past Medical History:  Diagnosis Date  . Depression   . Hypercholesteremia   . Hypertension   . Sleep apnea    no CPAP, mild diagnosis no CPAP needed   Past Surgical History:  Procedure Laterality Date  . BACK SURGERY     a couple  . broken nose    . CATARACT EXTRACTION W/PHACO Right 11/01/2012   Procedure: CATARACT EXTRACTION PHACO AND INTRAOCULAR LENS PLACEMENT (IOC);  Surgeon: Tonny Branch, MD;  Location: [facility];  Service: Ophthalmology;  Laterality: Right;  CDE=7.50  . CATARACT EXTRACTION W/PHACO Left 11/15/2012   Procedure: CATARACT EXTRACTION PHACO AND INTRAOCULAR LENS PLACEMENT (IOC);  Surgeon: Tonny Branch, MD;  Location: [facility];  Service: Ophthalmology;  Laterality: Left;  CDE 6.68  . COLONOSCOPY N/A 10/31/2016   Procedure: COLONOSCOPY;  Surgeon: Danie Binder, MD;  Location: [facility];  Service: Endoscopy;  Laterality: N/A;  11:45 pm  . GANGLION CYST EXCISION     Bilateral hands  . left bicep repair    . NASAL SEPTUM SURGERY    . ROTATOR CUFF REPAIR     right 2 repairs and one reconstruction  . TONSILLECTOMY    . TOTAL HIP ARTHROPLASTY Right   . VITRECTOMY     Social History   Socioeconomic History  . Marital status: Divorced    Spouse name: Not on file  . Number of children: Not on file  . Years of education: Not on file  . Highest education level: Not on file  Occupational History  . Not on file  Social Needs  . Financial resource strain: Not on file  . Food insecurity    Worry: Not on  file    Inability: Not on file  . Transportation needs    Medical: Not on file    Non-medical: Not on file  Tobacco Use  . Smoking status: Never Smoker  . Smokeless tobacco: Never Used  Substance and Sexual Activity  . Alcohol use: Yes    Alcohol/week: 5.0 standard drinks    Types: 5 Cans of beer per week    Comment: occasionally  . Drug use: No  . Sexual activity: Not on file  Lifestyle  . Physical activity    Days per week: Not on file    Minutes per session: Not on file  . Stress: Not on file  Relationships  . Social Herbalist on phone: Not on file    Gets together: Not on file    Attends religious service: Not on file    Active member of club or organization: Not on file    Attends meetings of clubs or organizations: Not on file    Relationship status: Not on file  Other Topics Concern  . Not on file  Social History Narrative  . Not on file   Family History  Problem Relation Age of Onset  . Colon cancer Neg Hx    No Known Allergies Prior to Admission medications   Medication Sig Start Date  End Date Taking? Authorizing Provider  aspirin EC 81 MG tablet Take 81 mg by mouth every morning.    Yes [provider]  citalopram (CELEXA) 20 MG tablet Take 20 mg by mouth at bedtime.    Yes [provider]  fenofibrate (TRICOR) 48 MG tablet Take 48 mg by mouth at bedtime.    Yes [provider]  lamoTRIgine (LAMICTAL) 150 MG tablet Take 150 mg by mouth 2 (two) times daily. 08/14/16  Yes [provider]  lisdexamfetamine (VYVANSE) 40 MG capsule Take 40 mg by mouth daily as needed (ADHD).    Yes [provider]  lisinopril-hydrochlorothiazide (PRINZIDE,ZESTORETIC) 10-12.5 MG per tablet Take 1 tablet by mouth every morning.    Yes [provider]  simvastatin (ZOCOR) 40 MG tablet Take 40 mg by mouth every evening.   Yes [provider]   No results found.  Positive ROS: All other systems have been  reviewed and were otherwise negative with the exception of those mentioned in the HPI and as above.  Physical Exam: General: Alert, no acute distress Cardiovascular: No pedal edema Respiratory: No cyanosis, no use of accessory musculature GI: No organomegaly, abdomen is soft and non-tender Skin: No lesions in the area of chief complaint Neurologic: Sensation intact distally Psychiatric: Patient is competent for consent with normal mood and affect Lymphatic: No axillary or cervical lymphadenopathy  MUSCULOSKELETAL:  Left upper extremity is warm and well-perfused with no open wounds.  Assessment: Left shoulder rotator cuff arthropathy  Plan: -Plan is for reverse shoulder arthroplasty of the left shoulder today.  We have previously discussed this at length in the office.  All questions were again solicited and answered to his satisfaction.  We discussed the risk of bleeding, infection, damage to surrounding neurovascular structures, fracture, dislocation, hardware loosening and persistent pain as well as the risk of anesthesia.  He has provided informed consent to proceed.  -Plan for admission to the orthopedic floor for routine postoperative care.    Nicholes Stairs, MD Cell [phone]    02/23/2019 12:37 PM

## 2019-02-23 NOTE — Anesthesia Preprocedure Evaluation (Addendum)
Anesthesia Evaluation  Patient identified by MRN, date of birth, ID band Patient awake    Reviewed: Allergy & Precautions, NPO status , Patient's Chart, lab work & pertinent test results  History of Anesthesia Complications Negative for: history of anesthetic complications  Airway Mallampati: III  TM Distance: >3 FB Neck ROM: Full    Dental  (+) Dental Advisory Given, Teeth Intact   Pulmonary sleep apnea ,    breath sounds clear to auscultation       Cardiovascular hypertension, Pt. on medications (-) angina Rhythm:Regular Rate:Normal     Neuro/Psych PSYCHIATRIC DISORDERS Depression negative neurological ROS     GI/Hepatic negative GI ROS, Neg liver ROS,   Endo/Other  negative endocrine ROS  Renal/GU negative Renal ROS     Musculoskeletal negative musculoskeletal ROS (+)   Abdominal   Peds  Hematology negative hematology ROS (+)   Anesthesia Other Findings   Reproductive/Obstetrics                            Anesthesia Physical Anesthesia Plan  ASA: II  Anesthesia Plan: General   Post-op Pain Management:  Regional for Post-op pain   Induction: Intravenous  PONV Risk Score and Plan: 2 and Treatment may vary due to age or medical condition, Ondansetron, Dexamethasone and Midazolam  Airway Management Planned: Oral ETT  Additional Equipment: None  Intra-op Plan:   Post-operative Plan: Extubation in OR  Informed Consent: I have reviewed the patients History and Physical, chart, labs and discussed the procedure including the risks, benefits and alternatives for the proposed anesthesia with the patient or authorized representative who has indicated his/her understanding and acceptance.     Dental advisory given  Plan Discussed with: CRNA and Anesthesiologist  Anesthesia Plan Comments:        Anesthesia Quick Evaluation

## 2019-02-23 NOTE — Op Note (Signed)
02/23/2019  3:12 PM  PATIENT:  Jacob Arellano    PRE-OPERATIVE DIAGNOSIS:  Left shoulder rotator cuff arthropathy  POST-OPERATIVE DIAGNOSIS:  Same  PROCEDURE:  REVERSE SHOULDER ARTHROPLASTY  SURGEON:  Nicholes Stairs, MD  ASSISTANT: Katy Apo, RNFA  ANESTHESIA:   General  ESTIMATED BLOOD LOSS: 300 cc  PREOPERATIVE INDICATIONS:  Jacob Arellano is a  63 y.o. male with a diagnosis of Left shoulder rotator cuff arthropathy who failed conservative measures and elected for surgical management.    The risks benefits and alternatives were discussed with the patient preoperatively including but not limited to the risks of infection, bleeding, nerve injury, cardiopulmonary complications, the need for revision surgery, dislocation, brachial plexus palsy, incomplete relief of pain, among others, and the patient was willing to proceed.  OPERATIVE IMPLANTS: Biomet size 11 mini humeral stem press-fit standard with a 40 mm reverse shoulder arthroplasty tray with a standard liner and a 40 +3 mm glenosphere with a mini baseplate and 4 locking screws and one central nonlocking screw.  OPERATIVE FINDINGS: Massive, retracted supraspinatus and infraspinatus tears to the glenoid with ruptured Long head of the biceps and upper 1/3 tearing of the subscapularis.  moderarate glenoid OA and mild humeral head OA.  OPERATIVE PROCEDURE: The patient was brought to the operating room and placed in the supine position. General anesthesia was administered. IV antibiotics were given. A Foley was not placed. Time out was performed. The upper extremity was prepped and draped in usual sterile fashion. The patient was in a beachchair position. Deltopectoral approach was carried out. The biceps was tenodesed to the pectoralis tendon with #2 Fiberwire. The subscapularis was released off of the bone and tagged for later repair.  I then performed circumferential releases of the humerus, and then dislocated the head,  and then reamed with the reamer to the above named size.  I then applied the extramedullary jig, and cut the humeral head in 30 of retroversion, and then turned my attention to the glenoid.  Deep retractors were placed, and I resected the labrum, and then placed a guidepin into the center position on the glenoid, with slight inferior inclination. I then reamed over the guidepin, and this created a small metaphyseal cancellus blush inferiorly, removing just the cartilage to the subchondral bone superiorly. The base plate was selected and impacted place, and then I secured it centrally with a nonlocking screw, and I had excellent purchase both inferiorly and superiorly. I placed a short locking screws on anterior and posterior aspects.  I then turned my attention to the glenosphere, and impacted this into place, placing slight inferior offset (set on B).   The glenoid sphere was completely seated, and had engagement of the [facility] taper. I then turned my attention back to the humerus.  I sequentially broached, and then trialed, and was found to restore soft tissue tension, and it had 2 finger tightness. Therefore the above named components were selected. The shoulder felt stable throughout functional motion.  Before I placed the real prosthesis I had also placed a total of 2 #2 FiberWire through drill holes in the humerus for later subscapularis repair.  I then impacted the real prosthesis into place, as well as the real humeral tray, and reduced the shoulder. The shoulder had excellent motion, and was stable, and I irrigated the wounds copiously.    I then used these to repair the subscapularis. This came down to bone.  I then irrigated the shoulder copiously once more, repaired the  deltopectoral interval with fiberwire followed by subcutaneous Vicryl with subcutaneous monocryl and Steri-Strips and sterile gauze for the skin. The patient was awakened and returned back in stable and satisfactory  condition. There no complications and He tolerated the procedure well.  All counts were correct x2.  Disposition: Patient was transported to PACU in stable condition.  Sling was placed on the left upper extremity to be worn for comfort only once his block has resolved he may remove this and begin active and passive motion as tolerated.  No lifting with the left arm.  He will be admitted to the orthopedics floor for routine postoperative care.  Anticipate discharge home tomorrow.  He will be seen in the office in 2 weeks for routine follow-up with no x-rays at that time.

## 2019-02-23 NOTE — Anesthesia Procedure Notes (Signed)
Anesthesia Regional Block: Interscalene brachial plexus block   Pre-Anesthetic Checklist: ,, timeout performed, Correct Patient, Correct Site, Correct Laterality, Correct Procedure, Correct Position, site marked, Risks and benefits discussed,  Surgical consent,  Pre-op evaluation,  At surgeon's request and post-op pain management  Laterality: Left  Prep: chloraprep       Needles:  Injection technique: Single-shot  Needle Type: Echogenic Needle     Needle Length: 5cm  Needle Gauge: 21     Additional Needles:   Narrative:  Start time: 02/23/2019 12:25 PM End time: 02/23/2019 12:29 PM Injection made incrementally with aspirations every 5 mL.  Performed by: Personally  Anesthesiologist: Audry Pili, MD  Additional Notes: No pain on injection. No increased resistance to injection. Injection made in 5cc increments. Good needle visualization. Patient tolerated the procedure well.

## 2019-02-23 NOTE — Transfer of Care (Signed)
Immediate Anesthesia Transfer of Care Note  Patient: Jacob Arellano  Procedure(s) Performed: REVERSE SHOULDER ARTHROPLASTY (Left Shoulder)  Patient Location: PACU  Anesthesia Type:GA combined with regional for post-op pain  Level of Consciousness: alert , oriented and drowsy  Airway & Oxygen Therapy: Patient Spontanous Breathing and Patient connected to nasal cannula oxygen  Post-op Assessment: Report given to RN and Post -op Vital signs reviewed and stable  Post vital signs: Reviewed and stable  Last Vitals:  Vitals Value Taken Time  BP    Temp 36.2 C 02/23/19 1542  Pulse 62 02/23/19 1545  Resp 13 02/23/19 1545  SpO2 100 % 02/23/19 1545  Vitals shown include unvalidated device data.  Last Pain:  Vitals:   02/23/19 1131  TempSrc:   PainSc: 0-No pain      Patients Stated Pain Goal: 3 (64/33/29 5188)  Complications: No apparent anesthesia complications

## 2019-02-23 NOTE — Discharge Instructions (Signed)
Ortho D/C instructions: - Sling to be worn for comfort only for 2 weeks.  May remove to do exercises as tolerated - no lifting with left arm - maintain post op bandage until your follow up appointment.  You may shower with this bandage in place but do not submerge under water.  - apply ice to the left shoulder for 20-30 minutes at a time per hour that you are awake  - return to see Dr. Stann Mainland in 2 weeks  - for mild to moderate pain use tylenol and or advil as directed, and for breakthrough pain use oxycodone as needed.

## 2019-02-24 ENCOUNTER — Encounter (HOSPITAL_COMMUNITY): Payer: Self-pay | Admitting: Orthopedic Surgery

## 2019-02-24 DIAGNOSIS — M75102 Unspecified rotator cuff tear or rupture of left shoulder, not specified as traumatic: Secondary | ICD-10-CM | POA: Diagnosis not present

## 2019-02-24 LAB — HEMOGLOBIN AND HEMATOCRIT, BLOOD
HCT: 30 % — ABNORMAL LOW (ref 39.0–52.0)
Hemoglobin: 9.9 g/dL — ABNORMAL LOW (ref 13.0–17.0)

## 2019-02-24 MED ORDER — ALPRAZOLAM 0.25 MG PO TABS: 0.2500 mg | ORAL_TABLET | Freq: Three times a day (TID) | ORAL | 0 refills | Status: DC | PRN

## 2019-02-24 MED ORDER — WHITE PETROLATUM EX OINT
TOPICAL_OINTMENT | CUTANEOUS | Status: AC
  Filled 2019-02-24: qty 28.35

## 2019-02-24 MED ORDER — ALPRAZOLAM 0.25 MG PO TABS
0.2500 mg | ORAL_TABLET | Freq: Three times a day (TID) | ORAL | Status: DC | PRN
  Administered 2019-02-24: 01:00:00 0.25 mg via ORAL
  Filled 2019-02-24: qty 1

## 2019-02-24 MED ORDER — ONDANSETRON 4 MG PO TBDP: 4.0000 mg | ORAL_TABLET | Freq: Three times a day (TID) | ORAL | 0 refills | Status: DC | PRN

## 2019-02-24 MED ORDER — OXYCODONE HCL 5 MG PO TABS: 5.0000 mg | ORAL_TABLET | ORAL | 0 refills | Status: DC | PRN

## 2019-02-24 NOTE — Plan of Care (Signed)
  Problem: Activity: Goal: Risk for activity intolerance will decrease Outcome: Adequate for Discharge   Problem: Pain Managment: Goal: General experience of comfort will improve Outcome: Adequate for Discharge   Problem: Safety: Goal: Ability to remain free from injury will improve Outcome: Adequate for Discharge   

## 2019-02-24 NOTE — Anesthesia Postprocedure Evaluation (Signed)
Anesthesia Post Note  Patient: Jacob Arellano  Procedure(s) Performed: REVERSE SHOULDER ARTHROPLASTY (Left Shoulder)     Patient location during evaluation: PACU Anesthesia Type: General Level of consciousness: awake and alert Pain management: pain level controlled Vital Signs Assessment: post-procedure vital signs reviewed and stable Respiratory status: spontaneous breathing, nonlabored ventilation and respiratory function stable Cardiovascular status: blood pressure returned to baseline and stable Postop Assessment: no apparent nausea or vomiting Anesthetic complications: no    Last Vitals:  Vitals:   02/23/19 2139 02/24/19 0854  BP: (!) 108/54 94/64  Pulse: 82 70  Resp: 16 18  Temp: 37.3 C 36.9 C  SpO2: 96% 98%    Last Pain:  Vitals:   02/24/19 0854  TempSrc: Oral  PainSc:                  Audry Pili

## 2019-02-24 NOTE — Progress Notes (Signed)
Pt given discharge instructions and gone over with him. Pt verbalized understanding. Belongings gathered to be sent home. Pt waiting on ride.

## 2019-02-24 NOTE — Evaluation (Signed)
Occupational Therapy Evaluation and Discharge Patient Details Name: Jacob Arellano MRN: [id number] DOB: 1956/03/30 Today's Date: 02/24/2019    History of Present Illness s/p L reverse TSA   Clinical Impression   All education completed as detailed below and reinforced with written handouts. Pt demonstrated and/or verbalized understanding.     Follow Up Recommendations  Follow surgeon's recommendation for DC plan and follow-up therapies    Equipment Recommendations  None recommended by OT    Recommendations for Other Services       Precautions / Restrictions Precautions Precautions: Shoulder Type of Shoulder Precautions: A/PROM FF to 140, ER to 30, ABD to 60 Shoulder Interventions: Shoulder sling/immobilizer;For comfort(and sleep) Precaution Booklet Issued: Yes (comment) Required Braces or Orthoses: Sling Restrictions Weight Bearing Restrictions: Yes LUE Weight Bearing: Non weight bearing      Mobility Bed Mobility Overal bed mobility: Modified Independent             General bed mobility comments: recommended getting up to R side of bed  Transfers Overall transfer level: Independent                    Balance                                           ADL either performed or assessed with clinical judgement   ADL Overall ADL's : Modified independent                                       General ADL Comments: instructed in positioning L UE in bed and chair, sling use and wearing schedule, NWB status, and compensatory strategies for ADL     Vision Baseline Vision/History: Wears glasses Wears Glasses: Reading only Patient Visual Report: No change from baseline       Perception     Praxis      Pertinent Vitals/Pain Pain Assessment: No/denies pain     Hand Dominance Right   Extremity/Trunk Assessment Upper Extremity Assessment Upper Extremity Assessment: LUE deficits/detail LUE Deficits / Details:  performed PROM L shoulder within MDs parameters x 10, PROM elbow to hand, nerve block intact LUE Coordination: decreased fine motor;decreased gross motor   Lower Extremity Assessment Lower Extremity Assessment: Overall WFL for tasks assessed   Cervical / Trunk Assessment Cervical / Trunk Assessment: Normal   Communication Communication Communication: No difficulties   Cognition Arousal/Alertness: Awake/alert Behavior During Therapy: WFL for tasks assessed/performed Overall Cognitive Status: Within Functional Limits for tasks assessed                                     General Comments       Exercises     Shoulder Instructions      Home Living Family/patient expects to be discharged to:: Private residence Living Arrangements: Alone Available Help at Discharge: Neighbor;Available PRN/intermittently;Family                                    Prior Functioning/Environment Level of Independence: Independent                 OT Problem List:  OT Treatment/Interventions:      OT Goals(Current goals can be found in the care plan section) Acute Rehab OT Goals Patient Stated Goal: regain use of L UE  OT Frequency:     Barriers to D/C:            Co-evaluation              AM-PAC OT "6 Clicks" Daily Activity     Outcome Measure Help from another person eating meals?: None Help from another person taking care of personal grooming?: None Help from another person toileting, which includes using toliet, bedpan, or urinal?: None Help from another person bathing (including washing, rinsing, drying)?: None Help from another person to put on and taking off regular upper body clothing?: None Help from another person to put on and taking off regular lower body clothing?: None 6 Click Score: 24   End of Session    Activity Tolerance: Patient tolerated treatment well Patient left: in bed;with call bell/phone within reach  OT  Visit Diagnosis: Muscle weakness (generalized) (M62.81)                Time: 0920-1010 OT Time Calculation (min): 50 min Charges:  OT General Charges $OT Visit: 1 Visit OT Evaluation $OT Eval Low Complexity: 1 Low OT Treatments $Self Care/Home Management : 8-22 mins $Therapeutic Exercise: 8-22 mins  Nestor Lewandowsky, OTR/L Acute Rehabilitation Services Pager: [phone] Office: [phone]  Jacob Arellano 02/24/2019, 10:53 AM

## 2019-02-24 NOTE — Progress Notes (Signed)
   Subjective:  Patient reports pain as mild.  Block slowly resolving.  Some anxiety over night about block, but better this am.  No SOB/CP or n/v.  Objective:   VITALS:   Vitals:   02/23/19 1625 02/23/19 1650 02/23/19 2139 02/24/19 0854  BP: 113/79 113/68 (!) 108/54 94/64  Pulse: 67 64 82 70  Resp: 15  16 18   Temp: (!) 97.2 F (36.2 C) (!) 97.5 F (36.4 C) 99.2 F (37.3 C) 98.4 F (36.9 C)  TempSrc:  Oral Oral Oral  SpO2: 99% 95% 96% 98%  Weight:      Height:        Sensation intact distally Intact pulses distally Incision: scant drainage Motor intact distal but not axillary and MC yet.  Lab Results  Component Value Date   WBC 4.7 02/23/2019   HGB 9.9 (L) 02/24/2019   HCT 30.0 (L) 02/24/2019   MCV 94.9 02/23/2019   PLT 296 02/23/2019   BMET    Component Value Date/Time   NA 139 02/21/2019 0843   K 4.5 02/21/2019 0843   CL 108 02/21/2019 0843   CO2 20 (L) 02/21/2019 0843   GLUCOSE 105 (H) 02/21/2019 0843   BUN 29 (H) 02/21/2019 0843   CREATININE 1.08 02/21/2019 0843   CALCIUM 9.6 02/21/2019 0843   GFRNONAA >60 02/21/2019 0843   GFRAA >60 02/21/2019 0843     Assessment/Plan: 1 Day Post-Op   Active Problems:   Rotator cuff arthropathy of left shoulder   S/P reverse total shoulder arthroplasty, left   Advance diet Up with therapy - OT this am, then home when comfy  - maintain sling for comfort - maintain dressing - follow up in 2 weeks    Nicholes Stairs 02/24/2019, 9:41 AM   Geralynn Rile, MD [phone]

## 2019-02-25 NOTE — Discharge Summary (Signed)
Patient ID: Jacob Arellano MRN: [id number] DOB/AGE: March 21, 1956 63 y.o.  Admit date: 02/23/2019 Discharge date: 02/24/2019  Primary Diagnosis: Left rotator cuff arthropathy  Admission Diagnoses:  Past Medical History:  Diagnosis Date  . Depression   . Hypercholesteremia   . Hypertension   . Rotator cuff arthropathy of left shoulder 02/23/2019  . Sleep apnea    no CPAP, mild diagnosis no CPAP needed   Discharge Diagnoses:   Active Problems:   Rotator cuff arthropathy of left shoulder   S/P reverse total shoulder arthroplasty, left  Estimated body mass index is 28 kg/m as calculated from the following:   Height as of this encounter: 6\' 1"  (1.854 m).   Weight as of this encounter: 96.3 kg.  Procedure:  Procedure(s) (LRB): REVERSE SHOULDER ARTHROPLASTY (Left)   Consults: None  HPI: Jacob Arellano was admitted for left shoulder reverse arthroplasty, and presented electively for the procedure and perioperative care.  Laboratory Data: Admission on 02/23/2019, Discharged on 02/24/2019  Component Date Value Ref Range Status  . WBC 02/23/2019 4.7  4.0 - 10.5 K/uL Final  . RBC 02/23/2019 4.15* 4.22 - 5.81 MIL/uL Final  . Hemoglobin 02/23/2019 12.6* 13.0 - 17.0 g/dL Final  . HCT 02/23/2019 39.4  39.0 - 52.0 % Final  . MCV 02/23/2019 94.9  80.0 - 100.0 fL Final  . MCH 02/23/2019 30.4  26.0 - 34.0 pg Final  . MCHC 02/23/2019 32.0  30.0 - 36.0 g/dL Final  . RDW 02/23/2019 13.1  11.5 - 15.5 % Final  . Platelets 02/23/2019 296  150 - 400 K/uL Final  . nRBC 02/23/2019 0.0  0.0 - 0.2 % Final   Performed at [facility], [place] [address] [place], [place] [address]  . Hemoglobin 02/24/2019 9.9* 13.0 - 17.0 g/dL Final   REPEATED TO VERIFY  . HCT 02/24/2019 30.0* 39.0 - 52.0 % Final   Performed at [facility], [place] [address], [place], [place] [address]  Hospital Outpatient Visit on 02/21/2019  Component Date Value Ref Range Status  . SARS Coronavirus 2 02/21/2019 NEGATIVE   NEGATIVE Final   Comment: (NOTE) SARS-CoV-2 target nucleic acids are NOT DETECTED. The SARS-CoV-2 RNA is generally detectable in upper and lower respiratory specimens during the acute phase of infection. Negative results do not preclude SARS-CoV-2 infection, do not rule out co-infections with other pathogens, and should not be used as the sole basis for treatment or other patient management decisions. Negative results must be combined with clinical observations, patient history, and epidemiological information. The expected result is Negative. Fact Sheet for Patients: SugarRoll.be Fact Sheet for Healthcare Providers: https://www.woods-mathews.com/ This test is not yet approved or cleared by the [place] FDA and  has been authorized for detection and/or diagnosis of SARS-CoV-2 by FDA under an Emergency Use Authorization (EUA). This EUA will remain  in effect (meaning this test can be used) for the duration of the COVID-19 declaration under Section 56                          4(b)(1) of the Act, 21 U.S.C. section 360bbb-3(b)(1), unless the authorization is terminated or revoked sooner. Performed at [facility], [place] [address] [place], [place] [address]   Hospital Outpatient Visit on 02/21/2019  Component Date Value Ref Range Status  . MRSA, PCR 02/21/2019 POSITIVE* NEGATIVE Final   Comment: RESULT CALLED TO, READ BACK BY AND VERIFIED WITH: Saunders Glance RN 11:05 02/21/19 (  [email or web address])   . Staphylococcus aureus 02/21/2019 POSITIVE* NEGATIVE Final   Comment: (NOTE) The Xpert SA Assay (FDA approved for NASAL specimens in patients 76 years of age and older), is one component of a comprehensive surveillance program. It is not intended to diagnose infection nor to guide or monitor treatment. Performed at [facility], [place] [address], [place], [place] [address]   . Sodium 02/21/2019 139  135 - 145 mmol/L Final  . Potassium 02/21/2019  4.5  3.5 - 5.1 mmol/L Final  . Chloride 02/21/2019 108  98 - 111 mmol/L Final  . CO2 02/21/2019 20* 22 - 32 mmol/L Final  . Glucose, Bld 02/21/2019 105* 70 - 99 mg/dL Final  . BUN 02/21/2019 29* 8 - 23 mg/dL Final  . Creatinine, Ser 02/21/2019 1.08  0.61 - 1.24 mg/dL Final  . Calcium 02/21/2019 9.6  8.9 - 10.3 mg/dL Final  . GFR calc non Af Amer 02/21/2019 >60  >60 mL/min Final  . GFR calc Af Amer 02/21/2019 >60  >60 mL/min Final  . Anion gap 02/21/2019 11  5 - 15 Final   Performed at [facility], [place] [address] [place], [place] [address]     X-Rays:Dg Shoulder Left Port  Result Date: 02/23/2019 CLINICAL DATA:  Status post total shoulder arthroplasty. EXAM: LEFT SHOULDER - 1 VIEW COMPARISON:  None. FINDINGS: There are expected postsurgical changes related to total shoulder arthroplasty on the left. Alignment appears near anatomic. There is no unexpected radiopaque foreign body. There is postoperative atelectasis at the left lung base. IMPRESSION: Expected postsurgical changes related to total shoulder arthroplasty on the left. Electronically Signed   By: Constance Holster M.D.   On: 02/23/2019 16:35    EKG: Orders placed or performed during the hospital encounter of 02/21/19  . EKG 12 lead  . EKG 12 lead     Hospital Course: Jacob Arellano is a 63 y.o. who was admitted to Hospital. They were brought to the operating room on 02/23/2019 and underwent Procedure(s): [place].  Patient tolerated the procedure well and was later transferred to the recovery room and then to the orthopaedic floor for postoperative care.  They were given PO and IV analgesics for pain control following their surgery.  They were given 24 hours of postoperative antibiotics of  Anti-infectives (From admission, onward)   Start     Dose/Rate Route Frequency Ordered Stop   02/23/19 1715  ceFAZolin (ANCEF) IVPB 1 g/50 mL premix     1 g 100 mL/hr over 30 Minutes Intravenous Every 6 hours  02/23/19 1701 02/24/19 0702   02/23/19 1438  vancomycin (VANCOCIN) powder  Status:  Discontinued       As needed 02/23/19 1438 02/23/19 1538   02/23/19 1100  ceFAZolin (ANCEF) IVPB 2g/100 mL premix     2 g 200 mL/hr over 30 Minutes Intravenous On call to O.R. 02/23/19 1057 02/23/19 1415   02/23/19 1100  vancomycin (VANCOCIN) IVPB 1000 mg/200 mL premix     1,000 mg 200 mL/hr over 60 Minutes Intravenous  Once 02/23/19 1055 02/23/19 1243     and started on DVT prophylaxis in the form of None.   OT was ordered for total joint protocol.  the incision was C/D/I.  By day one, the patient had progressed with therapy and meeting their goals.  Incision was healing well.  Patient was seen in rounds and was ready to go home.   Diet: Regular diet Activity:NWB Follow-up:in 2 weeks  Disposition - Home Discharged Condition: good   Discharge Instructions    Call MD / Call 911   Complete by: As directed    If you experience chest pain or shortness of breath, CALL 911 and be transported to the hospital emergency room.  If you develope a fever above 101 F, pus (white drainage) or increased drainage or redness at the wound, or calf pain, call your surgeon's office.   Constipation Prevention   Complete by: As directed    Drink plenty of fluids.  Prune juice may be helpful.  You may use a stool softener, such as Colace (over the counter) 100 mg twice a day.  Use MiraLax (over the counter) for constipation as needed.   Diet - low sodium heart healthy   Complete by: As directed    Increase activity slowly as tolerated   Complete by: As directed      Allergies as of 02/24/2019   No Known Allergies     Medication List    TAKE these medications   ALPRAZolam 0.25 MG tablet Commonly known as: XANAX Take 1 tablet (0.25 mg total) by mouth 3 (three) times daily as needed for anxiety.   aspirin EC 81 MG tablet Take 81 mg by mouth every morning.   citalopram 20 MG tablet Commonly known as: CELEXA Take 20  mg by mouth at bedtime.   fenofibrate 48 MG tablet Commonly known as: TRICOR Take 48 mg by mouth at bedtime.   lamoTRIgine 150 MG tablet Commonly known as: LAMICTAL Take 150 mg by mouth 2 (two) times daily.   lisdexamfetamine 40 MG capsule Commonly known as: VYVANSE Take 40 mg by mouth daily as needed (ADHD).   lisinopril-hydrochlorothiazide 10-12.5 MG tablet Commonly known as: ZESTORETIC Take 1 tablet by mouth every morning.   ondansetron 4 MG disintegrating tablet Commonly known as: Zofran ODT Take 1 tablet (4 mg total) by mouth every 8 (eight) hours as needed.   oxyCODONE 5 MG immediate release tablet Commonly known as: Roxicodone Take 1-2 tablets (5-10 mg total) by mouth every 4 (four) hours as needed for severe pain.   simvastatin 40 MG tablet Commonly known as: ZOCOR Take 40 mg by mouth every evening.      Follow-up Information    Nicholes Stairs, MD In 2 weeks.   Specialty: Orthopedic Surgery Why: For wound re-check Contact information: [address] [place]  [address] [phone]           Signed: Geralynn Rile, MD Orthopaedic Surgery 02/25/2019, 3:16 PM

## 2019-02-28 IMAGING — DX DG SHOULDER 2+V*R*
2 series · 2 of 2 positions shown · non-contrast
Comparison: 04/15/2016

CLINICAL DATA: Right shoulder pain

EXAM:
RIGHT SHOULDER - 2+ VIEW

[shoulder grashey]
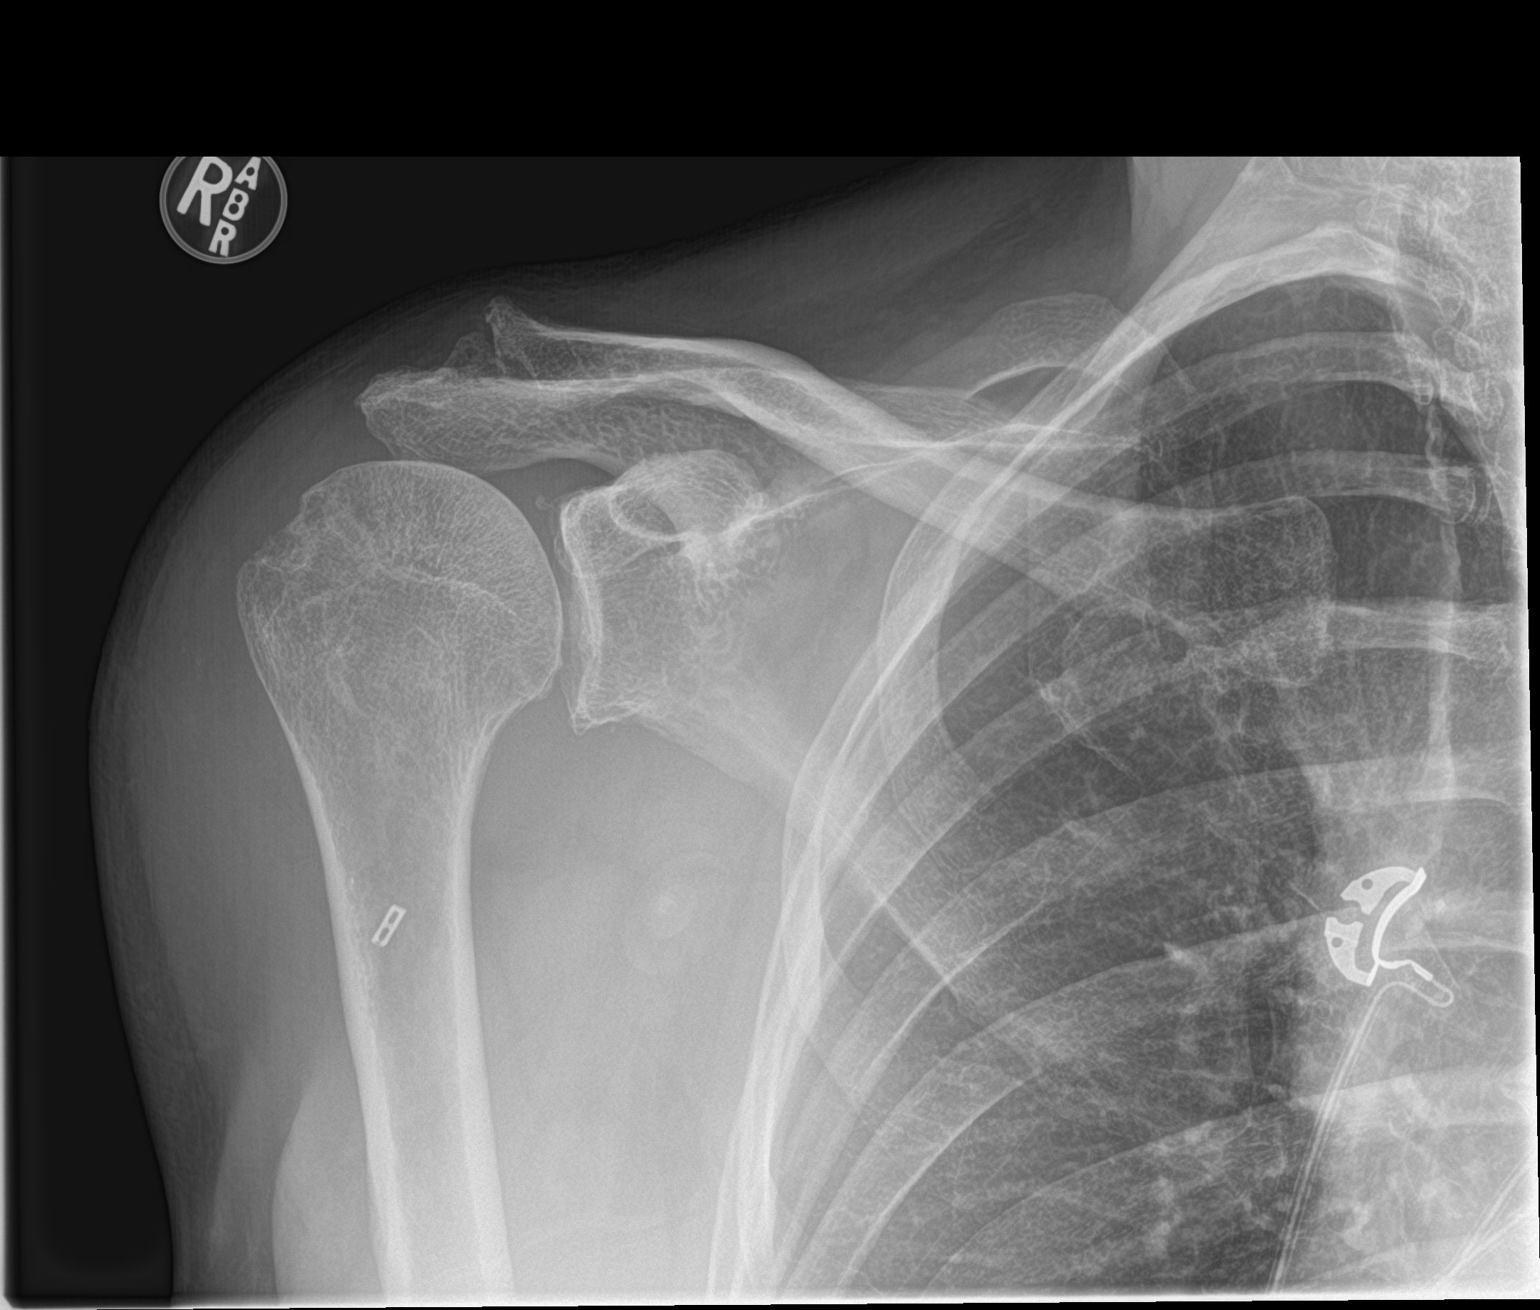

[shoulder y view]
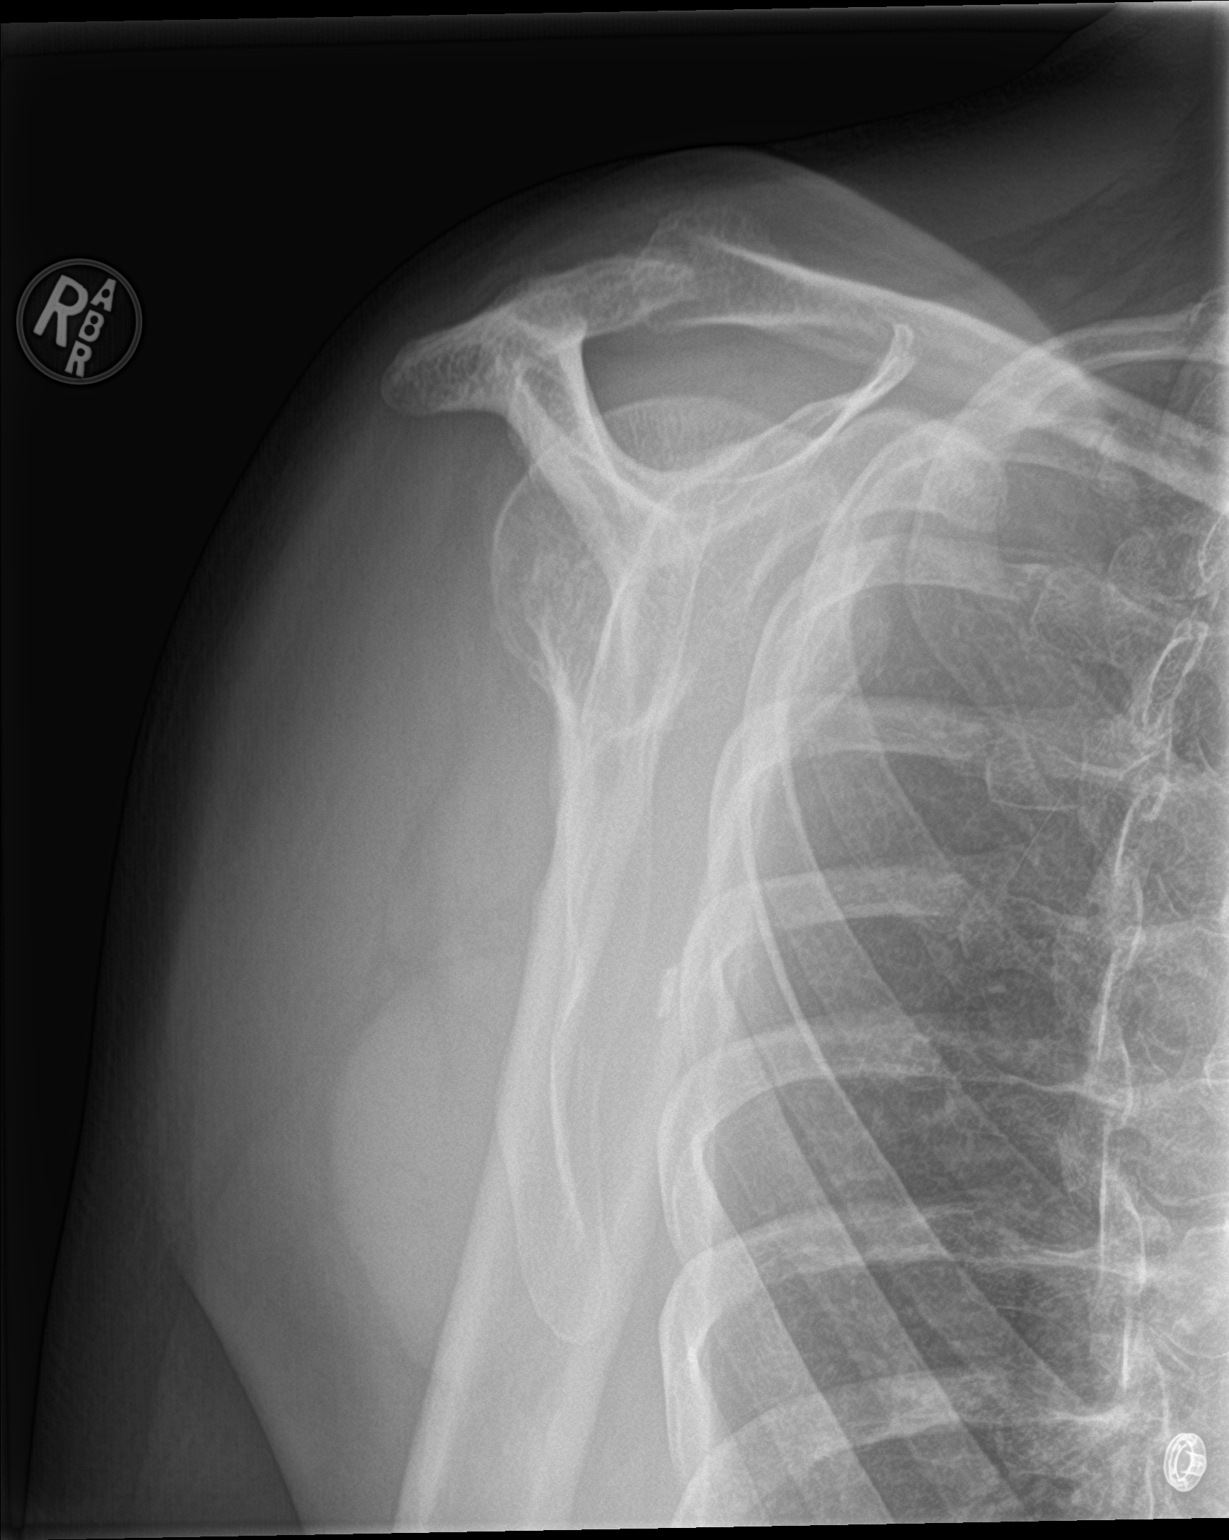

[2 of 2 positions shown; findings below may reference images not displayed]

FINDINGS: Moderate AC joint degenerative change. No dislocation or fracture of
the right humeral head. Right lung apex is clear. Mild glenohumeral
out degenerative change with superior and inferior bony spurring of
the glenoid.
IMPRESSION: 1. No definite acute displaced fracture or dislocation
2. Degenerative changes of the AC joint and glenohumeral interval.

## 2019-03-14 ENCOUNTER — Ambulatory Visit (HOSPITAL_COMMUNITY): Payer: PRIVATE HEALTH INSURANCE | Attending: Orthopedic Surgery | Admitting: Specialist

## 2019-03-14 ENCOUNTER — Other Ambulatory Visit: Payer: Self-pay

## 2019-03-14 DIAGNOSIS — M25512 Pain in left shoulder: Secondary | ICD-10-CM | POA: Insufficient documentation

## 2019-03-14 DIAGNOSIS — M25612 Stiffness of left shoulder, not elsewhere classified: Secondary | ICD-10-CM | POA: Diagnosis present

## 2019-03-14 DIAGNOSIS — R29898 Other symptoms and signs involving the musculoskeletal system: Secondary | ICD-10-CM

## 2019-03-14 NOTE — Patient Instructions (Signed)
SHOULDER: Flexion On Table   Place hands on towel placed on table, elbows straight. Lean forward with you upper body, pushing towel away from body.  ___ reps per set, ___ sets per day  Abduction (Passive)   With arm out to side, resting on towel placed on table with palm DOWN, keeping trunk away from table, lean to the side while pushing towel away from body.  Repeat ____ times. Do ____ sessions per day.  Copyright  VHI. All rights reserved.     Internal Rotation (Assistive)   Seated with elbow bent at right angle and held against side, slide arm on table surface in an inward arc keeping elbow anchored in place. Repeat ____ times. Do ____ sessions per day. Activity: Use this motion to brush crumbs off the table.  Copyright  VHI. All rights reserved.    

## 2019-03-15 ENCOUNTER — Encounter (HOSPITAL_COMMUNITY): Payer: Self-pay | Admitting: Specialist

## 2019-03-15 NOTE — Therapy (Signed)
[place], [place], [address] Phone: [phone]   Fax:  [phone]  Occupational Therapy Evaluation  Patient Details  Name: Jacob Arellano MRN: [id number] Date of Birth: [birth year] Referring Provider (OT): Dr. Victorino December   Encounter Date: 03/14/2019  OT End of Session - 03/15/19 0946    Visit Number  1    Number of Visits  16    Date for OT Re-Evaluation  05/14/19   mini reassess on 8/20   Authorization Type  [employer]    Authorization Time Period  30 visits OT/PT combined, able to request additional visits if medically necessary    Authorization - Visit Number  1    Authorization - Number of Visits  30    OT Start Time  1545    OT Stop Time  1630    OT Time Calculation (min)  45 min    Activity Tolerance  Patient tolerated treatment well    Behavior During Therapy  [facility] for tasks assessed/performed       Past Medical History:  Diagnosis Date  . Depression   . Hypercholesteremia   . Hypertension   . Rotator cuff arthropathy of left shoulder 02/23/2019  . Sleep apnea    no CPAP, mild diagnosis no CPAP needed    Past Surgical History:  Procedure Laterality Date  . BACK SURGERY     a couple  . broken nose    . CATARACT EXTRACTION W/PHACO Right 11/01/2012   Procedure: CATARACT EXTRACTION PHACO AND INTRAOCULAR LENS PLACEMENT (IOC);  Surgeon: Tonny Branch, MD;  Location: [facility];  Service: Ophthalmology;  Laterality: Right;  CDE=7.50  . CATARACT EXTRACTION W/PHACO Left 11/15/2012   Procedure: CATARACT EXTRACTION PHACO AND INTRAOCULAR LENS PLACEMENT (IOC);  Surgeon: Tonny Branch, MD;  Location: [facility];  Service: Ophthalmology;  Laterality: Left;  CDE 6.68  . COLONOSCOPY N/A 10/31/2016   Procedure: COLONOSCOPY;  Surgeon: Danie Binder, MD;  Location: [facility];  Service: Endoscopy;  Laterality: N/A;  11:45 pm  . GANGLION CYST EXCISION     Bilateral hands  . left bicep repair    . NASAL SEPTUM SURGERY    .  REVERSE SHOULDER ARTHROPLASTY Left 02/23/2019   Procedure: REVERSE SHOULDER ARTHROPLASTY;  Surgeon: Nicholes Stairs, MD;  Location: [facility];  Service: Orthopedics;  Laterality: Left;  170mins  . ROTATOR CUFF REPAIR     right 2 repairs and one reconstruction  . TONSILLECTOMY    . TOTAL HIP ARTHROPLASTY Right   . VITRECTOMY      There were no vitals filed for this visit.  Subjective Assessment - 03/15/19 0943    Subjective   S:  I am having much better results after this surgery than I did with the rotator cuff surgery.    Pertinent History  Jacob Arellano has been experiencing increased pain and decreased mobility in his left shoulder for greater than a year.  He consulted with Dr. Stann Mainland and underwent a left reverse total shoulder repair on 02/23/2019.  He has been referred to occupational therapy for evaluation and treatment.    Special Tests  FOTO:  52% independent    Patient Stated Goals  Regain full use of left arm    Currently in Pain?  Yes    Pain Score  4     Pain Location  Shoulder    Pain Orientation  Left    Pain Descriptors / Indicators  Aching  Pain Type  Acute pain    Pain Onset  More than a month ago    Pain Frequency  Intermittent    Aggravating Factors   movement of left shoulder    Pain Relieving Factors  ice        [facility] OT Assessment - 03/15/19 0001      Assessment   Medical Diagnosis  S/P Left Reverse Total Shoulder Replacement    Referring Provider (OT)  Dr. Victorino December    Onset Date/Surgical Date  02/23/19    Hand Dominance  Right    Prior Therapy  for left shoulder and hip      Precautions   Precautions  Shoulder    Type of Shoulder Precautions  follow protocol:  through 03/23/19 passive range to external rotaiton 30 degrees, abduction 60 degrees, flexion 90 degrees.  may begin aa/arom within same restrictions, 7/29-8/26 progress p/rom to external rotation 45, abduction 90, flexion 115, 8/26 and beyond push range to tolerance      Restrictions   Weight  Bearing Restrictions  No      Balance Screen   Has the patient fallen in the past 6 months  No    Has the patient had a decrease in activity level because of a fear of falling?   No    Is the patient reluctant to leave their home because of a fear of falling?   No      Home  Environment   Family/patient expects to be discharged to:  Private residence    Living Arrangements  Alone    Available Help at Discharge  Friend(s)      Prior Function   Level of Independence  Independent    Vocation  Full time employment    Health visitor    Leisure  spending time with friends      ADL   ADL comments  unable to use left arm for activities above waist height, has returned to work, unable to use left arm actively at work      Vision - History   Baseline Vision  No visual deficits      Observation/Other Assessments   Skin Integrity  healing 4.5" surgical incision on anterior left shoulder    Focus on Therapeutic Outcomes (FOTO)   52% independent      Sensation   Light Touch  Appears Intact      Coordination   Gross Motor Movements are Fluid and Coordinated  Yes    Fine Motor Movements are Fluid and Coordinated  Yes      ROM / Strength   AROM / PROM / Strength  AROM;PROM;Strength      Palpation   Palpation comment  mod-max fascial restrictions in left shoulder region      AROM   Overall AROM Comments  not tested due to recent surgery    AROM Assessment Site  Shoulder    Right/Left Shoulder  Left      PROM   Overall PROM Comments  assessed in supine.  external and internal rotation with shoulder adducted    PROM Assessment Site  Shoulder    Right/Left Shoulder  Left    Left Shoulder Flexion  125 Degrees    Left Shoulder ABduction  85 Degrees    Left Shoulder Internal Rotation  50 Degrees    Left Shoulder External Rotation  34 Degrees      Strength   Overall Strength Comments  not assessed due to recent surgery    Strength Assessment  Site  Shoulder    Right/Left Shoulder  Left               OT Treatments/Exercises (OP) - 03/15/19 0001      Manual Therapy   Manual Therapy  Myofascial release    Manual therapy comments  completed separate rest of treatment    Myofascial Release  myofascial release and manual stretching to left shoulder, scapular, upper arm and associated regions to decrease pain and stiffness and improve pain free mobility in left shoulder region.              OT Education - 03/15/19 0945    Education Details  educated patient on HEP for towel slides    Person(s) Educated  Patient    Methods  Explanation;Demonstration;Handout    Comprehension  Verbalized understanding;Returned demonstration       OT Short Term Goals - 03/15/19 0959      OT SHORT TERM GOAL #1   Title  Pt will understand and be independent with the HEP provided to facilitate his progress with ROM and strength.    Time  4    Period  Weeks    Status  New    Target Date  04/14/19      OT SHORT TERM GOAL #2   Title  Patient will improve left shoulder P/ROM to [facility] in order to don and doff shirts with increased ease.    Time  4    Period  Weeks    Status  New      OT SHORT TERM GOAL #3   Title  Patient will improve left shoulder strength to 3+/5 or better for improved ability to complete household tasks.    Time  4    Period  Weeks    Status  New      OT SHORT TERM GOAL #4   Title  Patient will decrease pain in his left shoulder to 3/10 or better during adl completion.    Time  4    Period  Weeks    Status  New      OT SHORT TERM GOAL #5   Title  Patient will decrease fascial restrictions to min- moderate in his left shoulder region for greater mobility needed for ADL completion.    Time  4    Period  Weeks    Status  New        OT Long Term Goals - 03/15/19 1001      OT LONG TERM GOAL #1   Title  Pt will return to prior level of independence with all daily and work activities using his left arm  actively.    Time  8    Period  Weeks    Status  New    Target Date  05/14/19      OT LONG TERM GOAL #2   Title  Pt will have decreased fascial restrictions to trace in his left shoulder region to relieve tightness and tenderness to facilitate better ROM.    Time  8    Period  Weeks    Status  New      OT LONG TERM GOAL #3   Title  L    Time  8    Period  Weeks    Status  New      OT LONG TERM GOAL #4   Title  Pt will have strength  of 4+/5 or better in his left arm for ADL tasks such as yard work and heavy household chores.    Time  8    Period  Weeks    Status  New      OT LONG TERM GOAL #5   Title  Patients A/ROM of  left arm will be within normal limits to increase his ability to complete all overhead reaching activities.    Time  8    Period  Weeks    Status  New            Plan - 03/15/19 0948    Clinical Impression Statement  A: Mr. Vernie Shanks is a 63 year old male with past medical history significant for back surgery, right rotator cuff surgeryx2 and right shoulder reconstructive surgery, right hip surgery, left knee pain.  Patient underwent left reverse total shoulder repair on 02/23/19.  Patient is unable to use his non dominant left arm with all desired B/IADLs, work, leisure tasks due to limited range and strength and increased pain.    OT Occupational Profile and History  Detailed Assessment- Review of Records and additional review of physical, cognitive, psychosocial history related to current functional performance    Occupational performance deficits (Please refer to evaluation for details):  ADL's;IADL's;Work;Leisure;Social Participation    Body Structure / Function / Physical Skills  ADL;Strength;Pain;UE functional use;ROM;IADL;Fascial restriction;Scar mobility;Muscle spasms    Rehab Potential  Good    Clinical Decision Making  Limited treatment options, no task modification necessary    Comorbidities Affecting Occupational Performance:  Presence of  comorbidities impacting occupational performance    Comorbidities impacting occupational performance description:  back surgery, right rotator cuff surgeryx2 and right shoulder reconstructive surgery, right hip surgery, left knee pain    Modification or Assistance to Complete Evaluation   No modification of tasks or assist necessary to complete eval    OT Frequency  2x / week    OT Duration  8 weeks    OT Treatment/Interventions  Self-care/ADL training;Moist Heat;DME and/or AE instruction;Therapeutic activities;Ultrasound;Therapeutic exercise;Scar mobilization;Passive range of motion;Neuromuscular education;Iontophoresis;Cryotherapy;Electrical Stimulation;Manual Therapy;Energy conservation;Patient/family education    Plan  P:  Skilled OT intervention 2 times a week for 8 weeks to decrease pain and stiffness and improve range and strength in order to return to prior level of independence with all desired B/IADLs, work, leisure tasks. Next session: P/ROM, isometrics, aa/rom progressing to a/rom within parameters of protocol, pulleys, upper cut and hitchhiker exercise .    OT Home Exercise Plan  table slides       Patient will benefit from skilled therapeutic intervention in order to improve the following deficits and impairments:   Body Structure / Function / Physical Skills: ADL, Strength, Pain, UE functional use, ROM, IADL, Fascial restriction, Scar mobility, Muscle spasms       Visit Diagnosis: 1. Stiffness of left shoulder, not elsewhere classified   2. Acute pain of left shoulder   3. Other symptoms and signs involving the musculoskeletal system       Problem List Patient Active Problem List   Diagnosis Date Noted  . Rotator cuff arthropathy of left shoulder 02/23/2019  . 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 Hip pain, acute, right 11/17/2017  . Special screening for malignant neoplasms, colon     Vangie Bicker, MHA, OTR/L [phone]  03/15/2019,  10:14 AM  [place], [place], [address] Phone: [phone]   Fax:  [phone]  Name: LUAY BALDING MRN: [id number] Date of Birth: 1956/03/15

## 2019-03-16 ENCOUNTER — Ambulatory Visit (HOSPITAL_COMMUNITY): Payer: PRIVATE HEALTH INSURANCE | Admitting: Occupational Therapy

## 2019-03-16 ENCOUNTER — Other Ambulatory Visit: Payer: Self-pay

## 2019-03-16 ENCOUNTER — Encounter (HOSPITAL_COMMUNITY): Payer: Self-pay | Admitting: Occupational Therapy

## 2019-03-16 DIAGNOSIS — R29898 Other symptoms and signs involving the musculoskeletal system: Secondary | ICD-10-CM

## 2019-03-16 DIAGNOSIS — M25512 Pain in left shoulder: Secondary | ICD-10-CM

## 2019-03-16 DIAGNOSIS — M25612 Stiffness of left shoulder, not elsewhere classified: Secondary | ICD-10-CM | POA: Diagnosis not present

## 2019-03-16 NOTE — Therapy (Signed)
[place], [place], [address] Phone: [phone]   Fax:  [phone]  Occupational Therapy Treatment  Patient Details  Name: Jacob Arellano MRN: [id number] Date of Birth: 06/12/1956 Referring Provider (OT): Dr. Victorino December   Encounter Date: 03/16/2019  OT End of Session - 03/16/19 1602    Visit Number  2    Number of Visits  16    Date for OT Re-Evaluation  05/14/19   mini reassess on 8/20   Authorization Type  [employer]    Authorization Time Period  30 visits OT/PT combined, able to request additional visits if medically necessary    Authorization - Visit Number  2    Authorization - Number of Visits  30    OT Start Time  1521    OT Stop Time  1600    OT Time Calculation (min)  39 min    Activity Tolerance  Patient tolerated treatment well    Behavior During Therapy  [facility] for tasks assessed/performed       Past Medical History:  Diagnosis Date  . Depression   . Hypercholesteremia   . Hypertension   . Rotator cuff arthropathy of left shoulder 02/23/2019  . Sleep apnea    no CPAP, mild diagnosis no CPAP needed    Past Surgical History:  Procedure Laterality Date  . BACK SURGERY     a couple  . broken nose    . CATARACT EXTRACTION W/PHACO Right 11/01/2012   Procedure: CATARACT EXTRACTION PHACO AND INTRAOCULAR LENS PLACEMENT (IOC);  Surgeon: Tonny Branch, MD;  Location: [facility];  Service: Ophthalmology;  Laterality: Right;  CDE=7.50  . CATARACT EXTRACTION W/PHACO Left 11/15/2012   Procedure: CATARACT EXTRACTION PHACO AND INTRAOCULAR LENS PLACEMENT (IOC);  Surgeon: Tonny Branch, MD;  Location: [facility];  Service: Ophthalmology;  Laterality: Left;  CDE 6.68  . COLONOSCOPY N/A 10/31/2016   Procedure: COLONOSCOPY;  Surgeon: Danie Binder, MD;  [facility];  Service: Endoscopy;  Laterality: N/A;  11:45 pm  . GANGLION CYST EXCISION     Bilateral hands  . left bicep repair    . NASAL SEPTUM SURGERY    .  REVERSE SHOULDER ARTHROPLASTY Left 02/23/2019   Procedure: REVERSE SHOULDER ARTHROPLASTY;  Surgeon: Nicholes Stairs, MD;  Location: [place];  Service: Orthopedics;  Laterality: Left;  158mins  . ROTATOR CUFF REPAIR     right 2 repairs and one reconstruction  . TONSILLECTOMY    . TOTAL HIP ARTHROPLASTY Right   . VITRECTOMY      There were no vitals filed for this visit.  Subjective Assessment - 03/16/19 1521    Subjective   S: I haven't done my exercises yet.    Currently in Pain?  No/denies         [facility] OT Assessment - 03/16/19 1521      Assessment   Medical Diagnosis  S/P Left Reverse Total Shoulder Replacement      Precautions   Precautions  Shoulder    Type of Shoulder Precautions  follow protocol:  through 03/23/19 passive range to external rotaiton 30 degrees, abduction 60 degrees, flexion 90 degrees.  may begin aa/arom within same restrictions, 7/29-8/26 progress p/rom to external rotation 45, abduction 90, flexion 115, 8/26 and beyond push range to tolerance               OT Treatments/Exercises (OP) - 03/16/19 1524      Exercises  Exercises  Shoulder      Shoulder Exercises: Supine   Protraction  PROM;5 reps;AAROM;12 reps    Horizontal ABduction  PROM;5 reps;AAROM;12 reps    External Rotation  PROM;5 reps;AAROM;12 reps    External Rotation Limitations  to 30 degrees    Internal Rotation  PROM;5 reps;AAROM;12 reps    Flexion  PROM;5 reps;AAROM;12 reps    Flexion Limitations  to 90 degrees    ABduction  PROM;5 reps;AAROM;12 reps    ABduction Limitations  to 60 degrees      Shoulder Exercises: Standing   Extension  AROM;12 reps    Row  AROM;12 reps    Other Standing Exercises  uppercuts: 12X A/ROM    Other Standing Exercises  scapular depression, A/ROM 12X      Shoulder Exercises: Pulleys   Flexion  1 minute    Flexion Limitations  to tolerance    ABduction  1 minute    ABduction Limitations  to tolerance      Shoulder Exercises: Therapy Ball    Flexion  10 reps    ABduction  10 reps      Shoulder Exercises: Isometric Strengthening   External Rotation  Supine;3X5"    ABduction  Supine;3X5"      Manual Therapy   Manual Therapy  Myofascial release    Manual therapy comments  completed separate rest of treatment    Myofascial Release  myofascial release and manual stretching to left shoulder, scapular, upper arm and associated regions to decrease pain and stiffness and improve pain free mobility in left shoulder region.                 OT Short Term Goals - 03/16/19 1604      OT SHORT TERM GOAL #1   Title  Pt will understand and be independent with the HEP provided to facilitate his progress with ROM and strength.    Time  4    Period  Weeks    Status  On-going    Target Date  04/14/19      OT SHORT TERM GOAL #2   Title  Patient will improve left shoulder P/ROM to [facility] in order to don and doff shirts with increased ease.    Time  4    Period  Weeks    Status  On-going      OT SHORT TERM GOAL #3   Title  Patient will improve left shoulder strength to 3+/5 or better for improved ability to complete household tasks.    Time  4    Period  Weeks    Status  On-going      OT SHORT TERM GOAL #4   Title  Patient will decrease pain in his left shoulder to 3/10 or better during adl completion.    Time  4    Period  Weeks    Status  On-going      OT SHORT TERM GOAL #5   Title  Patient will decrease fascial restrictions to min- moderate in his left shoulder region for greater mobility needed for ADL completion.    Time  4    Period  Weeks    Status  On-going        OT Long Term Goals - 03/16/19 1604      OT LONG TERM GOAL #1   Title  Pt will return to prior level of independence with all daily and work activities using his left arm actively.  Time  8    Period  Weeks    Status  On-going      OT LONG TERM GOAL #2   Title  Pt will have decreased fascial restrictions to trace in his left shoulder region to  relieve tightness and tenderness to facilitate better ROM.    Time  8    Period  Weeks    Status  On-going      OT LONG TERM GOAL #4   Title  Pt will have strength of 4+/5 or better in his left arm for ADL tasks such as yard work and heavy household chores.    Time  8    Period  Weeks    Status  On-going      OT LONG TERM GOAL #5   Title  Patients A/ROM of  left arm will be within normal limits to increase his ability to complete all overhead reaching activities.    Time  8    Period  Weeks    Status  On-going            Plan - 03/16/19 1602    Clinical Impression Statement  A: Initiated myofascial release, passive stretching, AA/ROM, abduction and er isometrics, and scapular ROM. Pt completing AA/ROM within protocol limits with good form, has more soreness with er and abduction. Verbal cuing for form and technique.    Body Structure / Function / Physical Skills  ADL;Strength;Pain;UE functional use;ROM;IADL;Fascial restriction;Scar mobility;Muscle spasms    Plan  P: Follow up on HEP completion, add AA/ROM in standing       Patient will benefit from skilled therapeutic intervention in order to improve the following deficits and impairments:   Body Structure / Function / Physical Skills: ADL, Strength, Pain, UE functional use, ROM, IADL, Fascial restriction, Scar mobility, Muscle spasms       Visit Diagnosis: 1. Stiffness of left shoulder, not elsewhere classified   2. Acute pain of left shoulder   3. Other symptoms and signs involving the musculoskeletal system       Problem List Patient Active Problem List   Diagnosis Date Noted  . Rotator cuff arthropathy of left shoulder 02/23/2019  . 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 Hip pain, acute, right 11/17/2017  . Special screening for malignant neoplasms, colon    Guadelupe Sabin, OTR/L  [phone] 03/16/2019, 4:05 PM  [place], [place], [address] Phone: [phone]   Fax:  [phone]  Name: Jacob Arellano MRN: [id number] Date of Birth: June 23, 1956

## 2019-03-22 ENCOUNTER — Telehealth (HOSPITAL_COMMUNITY): Payer: Self-pay | Admitting: Internal Medicine

## 2019-03-22 ENCOUNTER — Ambulatory Visit (HOSPITAL_COMMUNITY): Payer: PRIVATE HEALTH INSURANCE | Admitting: Occupational Therapy

## 2019-03-22 NOTE — Telephone Encounter (Signed)
03/22/19  pt left a message to cx said that he had a business conflict and he will call back to reschedule

## 2019-03-25 ENCOUNTER — Ambulatory Visit (HOSPITAL_COMMUNITY): Payer: PRIVATE HEALTH INSURANCE | Admitting: Specialist

## 2019-03-25 ENCOUNTER — Other Ambulatory Visit: Payer: Self-pay

## 2019-03-25 ENCOUNTER — Encounter (HOSPITAL_COMMUNITY): Payer: Self-pay | Admitting: Specialist

## 2019-03-25 DIAGNOSIS — R29898 Other symptoms and signs involving the musculoskeletal system: Secondary | ICD-10-CM

## 2019-03-25 DIAGNOSIS — M25612 Stiffness of left shoulder, not elsewhere classified: Secondary | ICD-10-CM | POA: Diagnosis not present

## 2019-03-25 DIAGNOSIS — M25512 Pain in left shoulder: Secondary | ICD-10-CM

## 2019-03-25 NOTE — Therapy (Signed)
[place], [place], [address] Phone: [phone]   Fax:  [phone]  Occupational Therapy Treatment  Patient Details  Name: Jacob Arellano MRN: [id number] Date of Birth: 1955-09-13 Referring Provider (OT): Dr. Victorino December   Encounter Date: 03/25/2019  OT End of Session - 03/25/19 1302    Visit Number  3    Number of Visits  16    Date for OT Re-Evaluation  05/14/19   mini reassess on 8/20   Authorization Type  [employer]    Authorization Time Period  30 visits OT/PT combined, able to request additional visits if medically necessary    Authorization - Visit Number  3    Authorization - Number of Visits  30    OT Start Time  1105    OT Stop Time  1145    OT Time Calculation (min)  40 min    Activity Tolerance  Patient tolerated treatment well    Behavior During Therapy  [facility] for tasks assessed/performed       Past Medical History:  Diagnosis Date  . Depression   . Hypercholesteremia   . Hypertension   . Rotator cuff arthropathy of left shoulder 02/23/2019  . Sleep apnea    no CPAP, mild diagnosis no CPAP needed    Past Surgical History:  Procedure Laterality Date  . BACK SURGERY     a couple  . broken nose    . CATARACT EXTRACTION W/PHACO Right 11/01/2012   Procedure: CATARACT EXTRACTION PHACO AND INTRAOCULAR LENS PLACEMENT (IOC);  Surgeon: Tonny Branch, MD;  Location: [facility];  Service: Ophthalmology;  Laterality: Right;  CDE=7.50  . CATARACT EXTRACTION W/PHACO Left 11/15/2012   Procedure: CATARACT EXTRACTION PHACO AND INTRAOCULAR LENS PLACEMENT (IOC);  Surgeon: Tonny Branch, MD;  Location: [facility];  Service: Ophthalmology;  Laterality: Left;  CDE 6.68  . COLONOSCOPY N/A 10/31/2016   Procedure: COLONOSCOPY;  Surgeon: Danie Binder, MD;  [facility];  Service: Endoscopy;  Laterality: N/A;  11:45 pm  . GANGLION CYST EXCISION     Bilateral hands  . left bicep repair    . NASAL SEPTUM SURGERY    .  REVERSE SHOULDER ARTHROPLASTY Left 02/23/2019   Procedure: REVERSE SHOULDER ARTHROPLASTY;  Surgeon: Nicholes Stairs, MD;  Location: [place];  Service: Orthopedics;  Laterality: Left;  154mns  . ROTATOR CUFF REPAIR     right 2 repairs and one reconstruction  . TONSILLECTOMY    . TOTAL HIP ARTHROPLASTY Right   . VITRECTOMY      There were no vitals filed for this visit.  Subjective Assessment - 03/25/19 1300    Subjective   S:  I did something I shouldnt have, I lost my balance and caught myself on the wall with my left arm.  It made my shoulder a little sore.    Currently in Pain?  Yes    Pain Score  2     Pain Location  Shoulder    Pain Orientation  Left    Pain Descriptors / Indicators  Sore    Pain Type  Acute pain    Pain Onset  1 to 4 weeks ago    Pain Frequency  Intermittent         [facility] OT Assessment - 03/25/19 0001      Assessment   Medical Diagnosis  S/P Left Reverse Total Shoulder Replacement      Precautions   Precautions  Shoulder    Type of Shoulder Precautions  follow protocol:  through 03/23/19 passive range to external rotaiton 30 degrees, abduction 60 degrees, flexion 90 degrees.  may begin aa/arom within same restrictions, 7/29-8/26 progress p/rom to external rotation 45, abduction 90, flexion 115, 8/26 and beyond push range to tolerance               OT Treatments/Exercises (OP) - 03/25/19 0001      Shoulder Exercises: Supine   Protraction  PROM;5 reps;AAROM;15 reps    Horizontal ABduction  PROM;5 reps;AAROM;15 reps    External Rotation  PROM;5 reps;AAROM;15 reps    External Rotation Limitations  to 45 degrees     Internal Rotation  PROM;5 reps;AAROM;15 reps    Flexion  PROM;5 reps;AAROM;15 reps    Flexion Limitations  to 115 degrees    ABduction  PROM;5 reps;AAROM;15 reps    ABduction Limitations  to 90 degrees       Shoulder Exercises: Seated   Elevation  AROM;15 reps    Retraction  AROM;15 reps    Row  AROM;15 reps      Shoulder  Exercises: ROM/Strengthening   Thumb Tacks  1'    Prot/Ret//Elev/Dep  1'      Manual Therapy   Manual Therapy  Myofascial release    Manual therapy comments  completed separate rest of treatment    Myofascial Release  myofascial release and manual stretching to left shoulder, scapular, upper arm and associated regions to decrease pain and stiffness and improve pain free mobility in left shoulder region.                 OT Short Term Goals - 03/16/19 1604      OT SHORT TERM GOAL #1   Title  Pt will understand and be independent with the HEP provided to facilitate his progress with ROM and strength.    Time  4    Period  Weeks    Status  On-going    Target Date  04/14/19      OT SHORT TERM GOAL #2   Title  Patient will improve left shoulder P/ROM to [facility] in order to don and doff shirts with increased ease.    Time  4    Period  Weeks    Status  On-going      OT SHORT TERM GOAL #3   Title  Patient will improve left shoulder strength to 3+/5 or better for improved ability to complete household tasks.    Time  4    Period  Weeks    Status  On-going      OT SHORT TERM GOAL #4   Title  Patient will decrease pain in his left shoulder to 3/10 or better during adl completion.    Time  4    Period  Weeks    Status  On-going      OT SHORT TERM GOAL #5   Title  Patient will decrease fascial restrictions to min- moderate in his left shoulder region for greater mobility needed for ADL completion.    Time  4    Period  Weeks    Status  On-going        OT Long Term Goals - 03/16/19 1604      OT LONG TERM GOAL #1   Title  Pt will return to prior level of independence with all daily and work activities using his left arm actively.    Time  8  Period  Weeks    Status  On-going      OT LONG TERM GOAL #2   Title  Pt will have decreased fascial restrictions to trace in his left shoulder region to relieve tightness and tenderness to facilitate better ROM.    Time  8     Period  Weeks    Status  On-going      OT LONG TERM GOAL #4   Title  Pt will have strength of 4+/5 or better in his left arm for ADL tasks such as yard work and heavy household chores.    Time  8    Period  Weeks    Status  On-going      OT LONG TERM GOAL #5   Title  Patients A/ROM of  left arm will be within normal limits to increase his ability to complete all overhead reaching activities.    Time  8    Period  Weeks    Status  On-going            Plan - 03/25/19 1307    Clinical Impression Statement  A:  Per protocol, able to advance rom limitations.  patient able to achieve limitations and tolerate with minor soreness, particularly with flexion.  added aa/rom in seated.    Body Structure / Function / Physical Skills  ADL;Strength;Pain;UE functional use;ROM;IADL;Fascial restriction;Scar mobility;Muscle spasms    Plan  P:  continue with aa/rom in standing/seated  add proximal shoulder strengthening and pulleys       Patient will benefit from skilled therapeutic intervention in order to improve the following deficits and impairments:   Body Structure / Function / Physical Skills: ADL, Strength, Pain, UE functional use, ROM, IADL, Fascial restriction, Scar mobility, Muscle spasms       Visit Diagnosis: 1. Stiffness of left shoulder, not elsewhere classified   2. Acute pain of left shoulder   3. Other symptoms and signs involving the musculoskeletal system       Problem List Patient Active Problem List   Diagnosis Date Noted  . Rotator cuff arthropathy of left shoulder 02/23/2019  . 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 Hip pain, acute, right 11/17/2017  . Special screening for malignant neoplasms, colon     Vangie Bicker, [place], OTR/L [phone]  03/25/2019, 1:37 PM  [place] [address] [place], [place], [address] Phone: [phone]   Fax:  [phone]  Name: BARRETT GOLDIE MRN:  [id number] Date of Birth: Jul 07, 1956

## 2019-03-28 ENCOUNTER — Other Ambulatory Visit: Payer: Self-pay

## 2019-03-28 ENCOUNTER — Encounter (HOSPITAL_COMMUNITY): Payer: Self-pay

## 2019-03-28 ENCOUNTER — Ambulatory Visit (HOSPITAL_COMMUNITY): Payer: No Typology Code available for payment source | Attending: Orthopedic Surgery

## 2019-03-28 DIAGNOSIS — M25612 Stiffness of left shoulder, not elsewhere classified: Secondary | ICD-10-CM

## 2019-03-28 DIAGNOSIS — M25512 Pain in left shoulder: Secondary | ICD-10-CM | POA: Insufficient documentation

## 2019-03-28 DIAGNOSIS — R29898 Other symptoms and signs involving the musculoskeletal system: Secondary | ICD-10-CM | POA: Diagnosis present

## 2019-03-28 NOTE — Therapy (Signed)
[place], [place], [address] Phone: [phone]   Fax:  [phone]  Occupational Therapy Treatment  Patient Details  Name: Jacob Arellano MRN: [id number] Date of Birth: Sep 27, 1955 Referring Provider (OT): Dr. Victorino December   Encounter Date: 03/28/2019  OT End of Session - 03/28/19 1619    Visit Number  4    Number of Visits  16    Date for OT Re-Evaluation  05/14/19   mini reassess on 8/20   Authorization Type  [employer]    Authorization Time Period  30 visits OT/PT combined, able to request additional visits if medically necessary    Authorization - Visit Number  4    Authorization - Number of Visits  30    OT Start Time  1534    OT Stop Time  1612    OT Time Calculation (min)  38 min    Activity Tolerance  Patient tolerated treatment well    Behavior During Therapy  [facility] for tasks assessed/performed       Past Medical History:  Diagnosis Date  . Depression   . Hypercholesteremia   . Hypertension   . Rotator cuff arthropathy of left shoulder 02/23/2019  . Sleep apnea    no CPAP, mild diagnosis no CPAP needed    Past Surgical History:  Procedure Laterality Date  . BACK SURGERY     a couple  . broken nose    . CATARACT EXTRACTION W/PHACO Right 11/01/2012   Procedure: CATARACT EXTRACTION PHACO AND INTRAOCULAR LENS PLACEMENT (IOC);  Surgeon: Tonny Branch, MD;  Location: [facility];  Service: Ophthalmology;  Laterality: Right;  CDE=7.50  . CATARACT EXTRACTION W/PHACO Left 11/15/2012   Procedure: CATARACT EXTRACTION PHACO AND INTRAOCULAR LENS PLACEMENT (IOC);  Surgeon: Tonny Branch, MD;  Location: [facility];  Service: Ophthalmology;  Laterality: Left;  CDE 6.68  . COLONOSCOPY N/A 10/31/2016   Procedure: COLONOSCOPY;  Surgeon: Danie Binder, MD;  [facility];  Service: Endoscopy;  Laterality: N/A;  11:45 pm  . GANGLION CYST EXCISION     Bilateral hands  . left bicep repair    . NASAL SEPTUM SURGERY    .  REVERSE SHOULDER ARTHROPLASTY Left 02/23/2019   Procedure: REVERSE SHOULDER ARTHROPLASTY;  Surgeon: Nicholes Stairs, MD;  Location: [facility];  Service: Orthopedics;  Laterality: Left;  137mns  . ROTATOR CUFF REPAIR     right 2 repairs and one reconstruction  . TONSILLECTOMY    . TOTAL HIP ARTHROPLASTY Right   . VITRECTOMY      There were no vitals filed for this visit.  Subjective Assessment - 03/28/19 1550    Subjective   S: It's been going really well. It's not as painful as a repair.    Currently in Pain?  Yes    Pain Score  2     Pain Location  Shoulder    Pain Orientation  Left    Pain Descriptors / Indicators  Sore    Pain Type  Acute pain         [facility] OT Assessment - 03/28/19 1550      Assessment   Medical Diagnosis  S/P Left Reverse Total Shoulder Replacement      Precautions   Precautions  Shoulder    Type of Shoulder Precautions  follow protocol:  through 03/23/19 passive range to external rotaiton 30 degrees, abduction 60 degrees, flexion 90 degrees.  may begin aa/arom within same restrictions, 7/29-8/26  progress p/rom to external rotation 45, abduction 90, flexion 115, 8/26 and beyond push range to tolerance               OT Treatments/Exercises (OP) - 03/28/19 1550      Exercises   Exercises  Shoulder      Shoulder Exercises: Supine   Protraction  PROM;5 reps;AAROM;15 reps    Horizontal ABduction  PROM;5 reps;AAROM;15 reps    External Rotation  PROM;5 reps;AAROM;15 reps    External Rotation Limitations  to 45 degrees     Flexion  PROM;5 reps;AAROM;15 reps    Flexion Limitations  to 115 degrees    ABduction  PROM;5 reps;AAROM;15 reps    ABduction Limitations  to 90 degrees       Shoulder Exercises: Standing   Protraction  AAROM;15 reps    Horizontal ABduction  AAROM;10 reps    External Rotation  AAROM;10 reps    External Rotation Limitations  to 45 degrees    Internal Rotation  AAROM;10 reps    Flexion  AAROM;10 reps    Flexion Limitations   to 115 degrees    ABduction  AAROM;10 reps    ABduction Limitations  to 90 degrees      Shoulder Exercises: Pulleys   Flexion  1 minute   standing   Flexion Limitations  to 115 degrees    ABduction  1 minute   standing   ABduction Limitations  to 90 degrees      Shoulder Exercises: ROM/Strengthening   Wall Wash  1'    Thumb Tacks  1' one rest break    Proximal Shoulder Strengthening, Supine  10X A/ROM one rest break    Proximal Shoulder Strengthening, Seated  10X A/ROM no rest breaks    Prot/Ret//Elev/Dep  1'      Manual Therapy   Manual Therapy  Myofascial release    Manual therapy comments  completed separate rest of treatment    Myofascial Release  myofascial release and manual stretching to left shoulder, scapular, upper arm and associated regions to decrease pain and stiffness and improve pain free mobility in left shoulder region.               OT Education - 03/28/19 1619    Education Details  AA/ROM shoulder exercises. Reviewed ROM restrictions.    Person(s) Educated  Patient    Methods  Explanation;Demonstration;Handout    Comprehension  Verbalized understanding;Returned demonstration       OT Short Term Goals - 03/16/19 1604      OT SHORT TERM GOAL #1   Title  Pt will understand and be independent with the HEP provided to facilitate his progress with ROM and strength.    Time  4    Period  Weeks    Status  On-going    Target Date  04/14/19      OT SHORT TERM GOAL #2   Title  Patient will improve left shoulder P/ROM to [facility] in order to don and doff shirts with increased ease.    Time  4    Period  Weeks    Status  On-going      OT SHORT TERM GOAL #3   Title  Patient will improve left shoulder strength to 3+/5 or better for improved ability to complete household tasks.    Time  4    Period  Weeks    Status  On-going      OT SHORT TERM GOAL #4  Title  Patient will decrease pain in his left shoulder to 3/10 or better during adl completion.    Time   4    Period  Weeks    Status  On-going      OT SHORT TERM GOAL #5   Title  Patient will decrease fascial restrictions to min- moderate in his left shoulder region for greater mobility needed for ADL completion.    Time  4    Period  Weeks    Status  On-going        OT Long Term Goals - 03/16/19 1604      OT LONG TERM GOAL #1   Title  Pt will return to prior level of independence with all daily and work activities using his left arm actively.    Time  8    Period  Weeks    Status  On-going      OT LONG TERM GOAL #2   Title  Pt will have decreased fascial restrictions to trace in his left shoulder region to relieve tightness and tenderness to facilitate better ROM.    Time  8    Period  Weeks    Status  On-going      OT LONG TERM GOAL #4   Title  Pt will have strength of 4+/5 or better in his left arm for ADL tasks such as yard work and heavy household chores.    Time  8    Period  Weeks    Status  On-going      OT LONG TERM GOAL #5   Title  Patients A/ROM of  left arm will be within normal limits to increase his ability to complete all overhead reaching activities.    Time  8    Period  Weeks    Status  On-going            Plan - 03/28/19 1620    Clinical Impression Statement  A: Pt able to complete supine and standing AA/ROM exercises while remaining within protocol ROM restrictions. Added proximal shoulder strengthening supine and standing. Patient did require one rest break during thumb tacks this date due to muscle fatigue from increased weakness. VC for form and technique were provided. Manual techniques were completed to address max fascial restrictions in [facility] joint and deltoid region.    Body Structure / Function / Physical Skills  ADL;Strength;Pain;UE functional use;ROM;IADL;Fascial restriction;Scar mobility;Muscle spasms    Plan  P: Continue with AA/ROM while remaining within patient's ROM restrictions. Increase shoulder endurance by completing thumb tacks  without rest breaks.    Consulted and Agree with Plan of Care  Patient       Patient will benefit from skilled therapeutic intervention in order to improve the following deficits and impairments:   Body Structure / Function / Physical Skills: ADL, Strength, Pain, UE functional use, ROM, IADL, Fascial restriction, Scar mobility, Muscle spasms       Visit Diagnosis: 1. Acute pain of left shoulder   2. Other symptoms and signs involving the musculoskeletal system   3. Stiffness of left shoulder, not elsewhere classified       Problem List Patient Active Problem List   Diagnosis Date Noted  . Rotator cuff arthropathy of left shoulder 02/23/2019  . 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 Hip pain, acute, right 11/17/2017  . Special screening for malignant neoplasms, colon    Ailene Ravel, OTR/L,CBIS  [phone]  03/28/2019, 4:22 PM  [place]  [facility] [address] [place], [place], [address] Phone: [phone]   Fax:  [phone]  Name: LORENA CLEARMAN MRN: [id number] Date of Birth: 1956-04-22

## 2019-03-28 NOTE — Patient Instructions (Signed)
Perform each exercise ________ reps. 2-3x days.   Protraction - STANDING  Start by holding a wand or cane at chest height.  Next, slowly push the wand outwards in front of your body so that your elbows become fully straightened. Then, return to the original position.     Shoulder FLEXION - STANDING - PALMS DOWN *Don't  go higher than 115 degrees.  In the standing position, hold a wand/cane with both arms, palms down on both sides. Raise up the wand/cane allowing your unaffected arm to perform most of the effort. Your affected arm should be partially relaxed.      Internal/External ROTATION - STANDING *Dont' go past 45 degrees  In the standing position, hold a wand/cane with both hands keeping your elbows bent. Move your arms and wand/cane to one side.  Your affected arm should be partially relaxed while your unaffected arm performs most of the effort.       Shoulder ABDUCTION - STANDING *Don't go past 90 degrees (shoulder level)  While holding a wand/cane palm face up on the injured side and palm face down on the uninjured side, slowly raise up your injured arm to the side.           Horizontal Abduction/Adduction      Straight arms holding cane at shoulder height, bring cane to right, center, left. Repeat starting to left.   Copyright  [employer]. All rights reserved.

## 2019-03-30 ENCOUNTER — Ambulatory Visit (HOSPITAL_COMMUNITY): Payer: No Typology Code available for payment source

## 2019-04-04 ENCOUNTER — Encounter (HOSPITAL_COMMUNITY): Payer: Self-pay

## 2019-04-04 ENCOUNTER — Ambulatory Visit (HOSPITAL_COMMUNITY): Payer: No Typology Code available for payment source

## 2019-04-04 ENCOUNTER — Other Ambulatory Visit: Payer: Self-pay

## 2019-04-04 DIAGNOSIS — M25512 Pain in left shoulder: Secondary | ICD-10-CM | POA: Diagnosis not present

## 2019-04-04 DIAGNOSIS — M25612 Stiffness of left shoulder, not elsewhere classified: Secondary | ICD-10-CM

## 2019-04-04 DIAGNOSIS — R29898 Other symptoms and signs involving the musculoskeletal system: Secondary | ICD-10-CM

## 2019-04-04 NOTE — Therapy (Signed)
[place], [place], [address] Phone: [phone]   Fax:  [phone]  Occupational Therapy Treatment  Patient Details  Name: Jacob Arellano MRN: [id number] Date of Birth: [birth year] Referring Provider (OT): Dr. Victorino December   Encounter Date: 04/04/2019  OT End of Session - 04/04/19 1629    Visit Number  5    Number of Visits  16    Date for OT Re-Evaluation  05/14/19   mini reassess on 8/20   Authorization Type  [employer]    Authorization Time Period  30 visits OT/PT combined, able to request additional visits if medically necessary    Authorization - Visit Number  5    Authorization - Number of Visits  30    OT Start Time  1536    OT Stop Time  1615    OT Time Calculation (min)  39 min    Activity Tolerance  Patient tolerated treatment well    Behavior During Therapy  [facility] for tasks assessed/performed       Past Medical History:  Diagnosis Date  . Depression   . Hypercholesteremia   . Hypertension   . Rotator cuff arthropathy of left shoulder 02/23/2019  . Sleep apnea    no CPAP, mild diagnosis no CPAP needed    Past Surgical History:  Procedure Laterality Date  . BACK SURGERY     a couple  . broken nose    . CATARACT EXTRACTION W/PHACO Right 11/01/2012   Procedure: CATARACT EXTRACTION PHACO AND INTRAOCULAR LENS PLACEMENT (IOC);  Surgeon: Tonny Branch, MD;  Location: [facility];  Service: Ophthalmology;  Laterality: Right;  CDE=7.50  . CATARACT EXTRACTION W/PHACO Left 11/15/2012   Procedure: CATARACT EXTRACTION PHACO AND INTRAOCULAR LENS PLACEMENT (IOC);  Surgeon: Tonny Branch, MD;  Location: [facility];  Service: Ophthalmology;  Laterality: Left;  CDE 6.68  . COLONOSCOPY N/A 10/31/2016   Procedure: COLONOSCOPY;  Surgeon: Danie Binder, MD;  [facility];  Service: Endoscopy;  Laterality: N/A;  11:45 pm  . GANGLION CYST EXCISION     Bilateral hands  . left bicep repair    . NASAL SEPTUM SURGERY    .  REVERSE SHOULDER ARTHROPLASTY Left 02/23/2019   Procedure: REVERSE SHOULDER ARTHROPLASTY;  Surgeon: Nicholes Stairs, MD;  Location: [facility];  Service: Orthopedics;  Laterality: Left;  127mns  . ROTATOR CUFF REPAIR     right 2 repairs and one reconstruction  . TONSILLECTOMY    . TOTAL HIP ARTHROPLASTY Right   . VITRECTOMY      There were no vitals filed for this visit.  Subjective Assessment - 04/04/19 1559    Subjective   S: I think I may have slept funny. It's not sore all the time. It only hurts when I move it certain ways.    Currently in Pain?  No/denies         [facility]      Assessment   Medical Diagnosis  S/P Left Reverse Total Shoulder Replacement      Precautions   Precautions  Shoulder    Type of Shoulder Precautions  follow protocol:  through 03/23/19 passive range to external rotaiton 30 degrees, abduction 60 degrees, flexion 90 degrees.  may begin aa/arom within same restrictions, 7/29-8/26 progress p/rom to external rotation 45, abduction 90, flexion 115, 8/26 and beyond push range to tolerance  OT Treatments/Exercises (OP) - 04/04/19 1559      Exercises   Exercises  Shoulder      Shoulder Exercises: Standing   Protraction  AAROM;15 reps    Horizontal ABduction  AAROM;10 reps    Horizontal ABduction Limitations  1 rest breaks    External Rotation  AAROM;15 reps    External Rotation Limitations  to 45 degrees    Internal Rotation  AAROM;15 reps    Flexion  AAROM;15 reps    Flexion Limitations  to 115 degrees    ABduction  AAROM;10 reps    ABduction Limitations  to 90 degrees    Extension  Theraband;10 reps    Theraband Level (Shoulder Extension)  Level 2 (Red)    Row  Theraband;10 reps    Theraband Level (Shoulder Row)  Level 2 (Red)    Retraction  Theraband;10 reps    Theraband Level (Shoulder Retraction)  Level 2 (Red)      Shoulder Exercises: ROM/Strengthening   Wall Wash  1'    Thumb Tacks  1'     Proximal Shoulder Strengthening, Supine  12X A/ROM one rest break    Proximal Shoulder Strengthening, Seated  10X A/ROM one rest breaks    Prot/Ret//Elev/Dep  1'      Manual Therapy   Manual Therapy  Myofascial release    Manual therapy comments  completed separate rest of treatment               OT Short Term Goals - 03/16/19 1604      OT SHORT TERM GOAL #1   Title  Pt will understand and be independent with the HEP provided to facilitate his progress with ROM and strength.    Time  4    Period  Weeks    Status  On-going    Target Date  04/14/19      OT SHORT TERM GOAL #2   Title  Patient will improve left shoulder P/ROM to [facility] in order to don and doff shirts with increased ease.    Time  4    Period  Weeks    Status  On-going      OT SHORT TERM GOAL #3   Title  Patient will improve left shoulder strength to 3+/5 or better for improved ability to complete household tasks.    Time  4    Period  Weeks    Status  On-going      OT SHORT TERM GOAL #4   Title  Patient will decrease pain in his left shoulder to 3/10 or better during adl completion.    Time  4    Period  Weeks    Status  On-going      OT SHORT TERM GOAL #5   Title  Patient will decrease fascial restrictions to min- moderate in his left shoulder region for greater mobility needed for ADL completion.    Time  4    Period  Weeks    Status  On-going        OT Long Term Goals - 03/16/19 1604      OT LONG TERM GOAL #1   Title  Pt will return to prior level of independence with all daily and work activities using his left arm actively.    Time  8    Period  Weeks    Status  On-going      OT LONG TERM GOAL #2   Title  Pt will have decreased  fascial restrictions to trace in his left shoulder region to relieve tightness and tenderness to facilitate better ROM.    Time  8    Period  Weeks    Status  On-going      OT LONG TERM GOAL #4   Title  Pt will have strength of 4+/5 or better in his left arm  for ADL tasks such as yard work and heavy household chores.    Time  8    Period  Weeks    Status  On-going      OT LONG TERM GOAL #5   Title  Patients A/ROM of  left arm will be within normal limits to increase his ability to complete all overhead reaching activities.    Time  8    Period  Weeks    Status  On-going            Plan - 04/04/19 1630    Clinical Impression Statement  A: Patient reports that MD appointment went well on friday. MD reported that everything is healing nicely. patient was able to complete thumb tacks without any rest breaks although did require a rest break for proximal shoulder strengthening when standing. VC for form and technique were provided as needed. Added scapular theraband exercises for strengthening.    Body Structure / Function / Physical Skills  ADL;Strength;Pain;UE functional use;ROM;IADL;Fascial restriction;Scar mobility;Muscle spasms    Plan  P: Provide scapular theraband for HEP.    Consulted and Agree with Plan of Care  Patient       Patient will benefit from skilled therapeutic intervention in order to improve the following deficits and impairments:   Body Structure / Function / Physical Skills: ADL, Strength, Pain, UE functional use, ROM, IADL, Fascial restriction, Scar mobility, Muscle spasms       Visit Diagnosis: 1. Other symptoms and signs involving the musculoskeletal system   2. Acute pain of left shoulder   3. Stiffness of left shoulder, not elsewhere classified       Problem List Patient Active Problem List   Diagnosis Date Noted  . Rotator cuff arthropathy of left shoulder 02/23/2019  . 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 Hip pain, acute, right 11/17/2017  . Special screening for malignant neoplasms, colon    Ailene Ravel, OTR/L,CBIS  [phone]  04/04/2019, 4:32 PM  [place] [address] [place], [place], [address] Phone:  [phone]   Fax:  [phone]  Name: Jacob Arellano MRN: [id number] Date of Birth: March 27, 1956

## 2019-04-06 ENCOUNTER — Encounter (HOSPITAL_COMMUNITY): Payer: Self-pay

## 2019-04-06 ENCOUNTER — Other Ambulatory Visit: Payer: Self-pay

## 2019-04-06 ENCOUNTER — Ambulatory Visit (HOSPITAL_COMMUNITY): Payer: No Typology Code available for payment source

## 2019-04-06 DIAGNOSIS — M25512 Pain in left shoulder: Secondary | ICD-10-CM

## 2019-04-06 DIAGNOSIS — M25612 Stiffness of left shoulder, not elsewhere classified: Secondary | ICD-10-CM

## 2019-04-06 DIAGNOSIS — R29898 Other symptoms and signs involving the musculoskeletal system: Secondary | ICD-10-CM

## 2019-04-06 IMAGING — DX DG CHEST 2V
2 series · 2 of 2 positions shown · non-contrast
Comparison: None.

CLINICAL DATA: Fever. Technologist notes state patient currently
receiving IV antibiotics for positive cultures from shoulder
surgery. Chills, fever and generalized body aches for few days.

EXAM:
CHEST  2 VIEW

[chest pa]
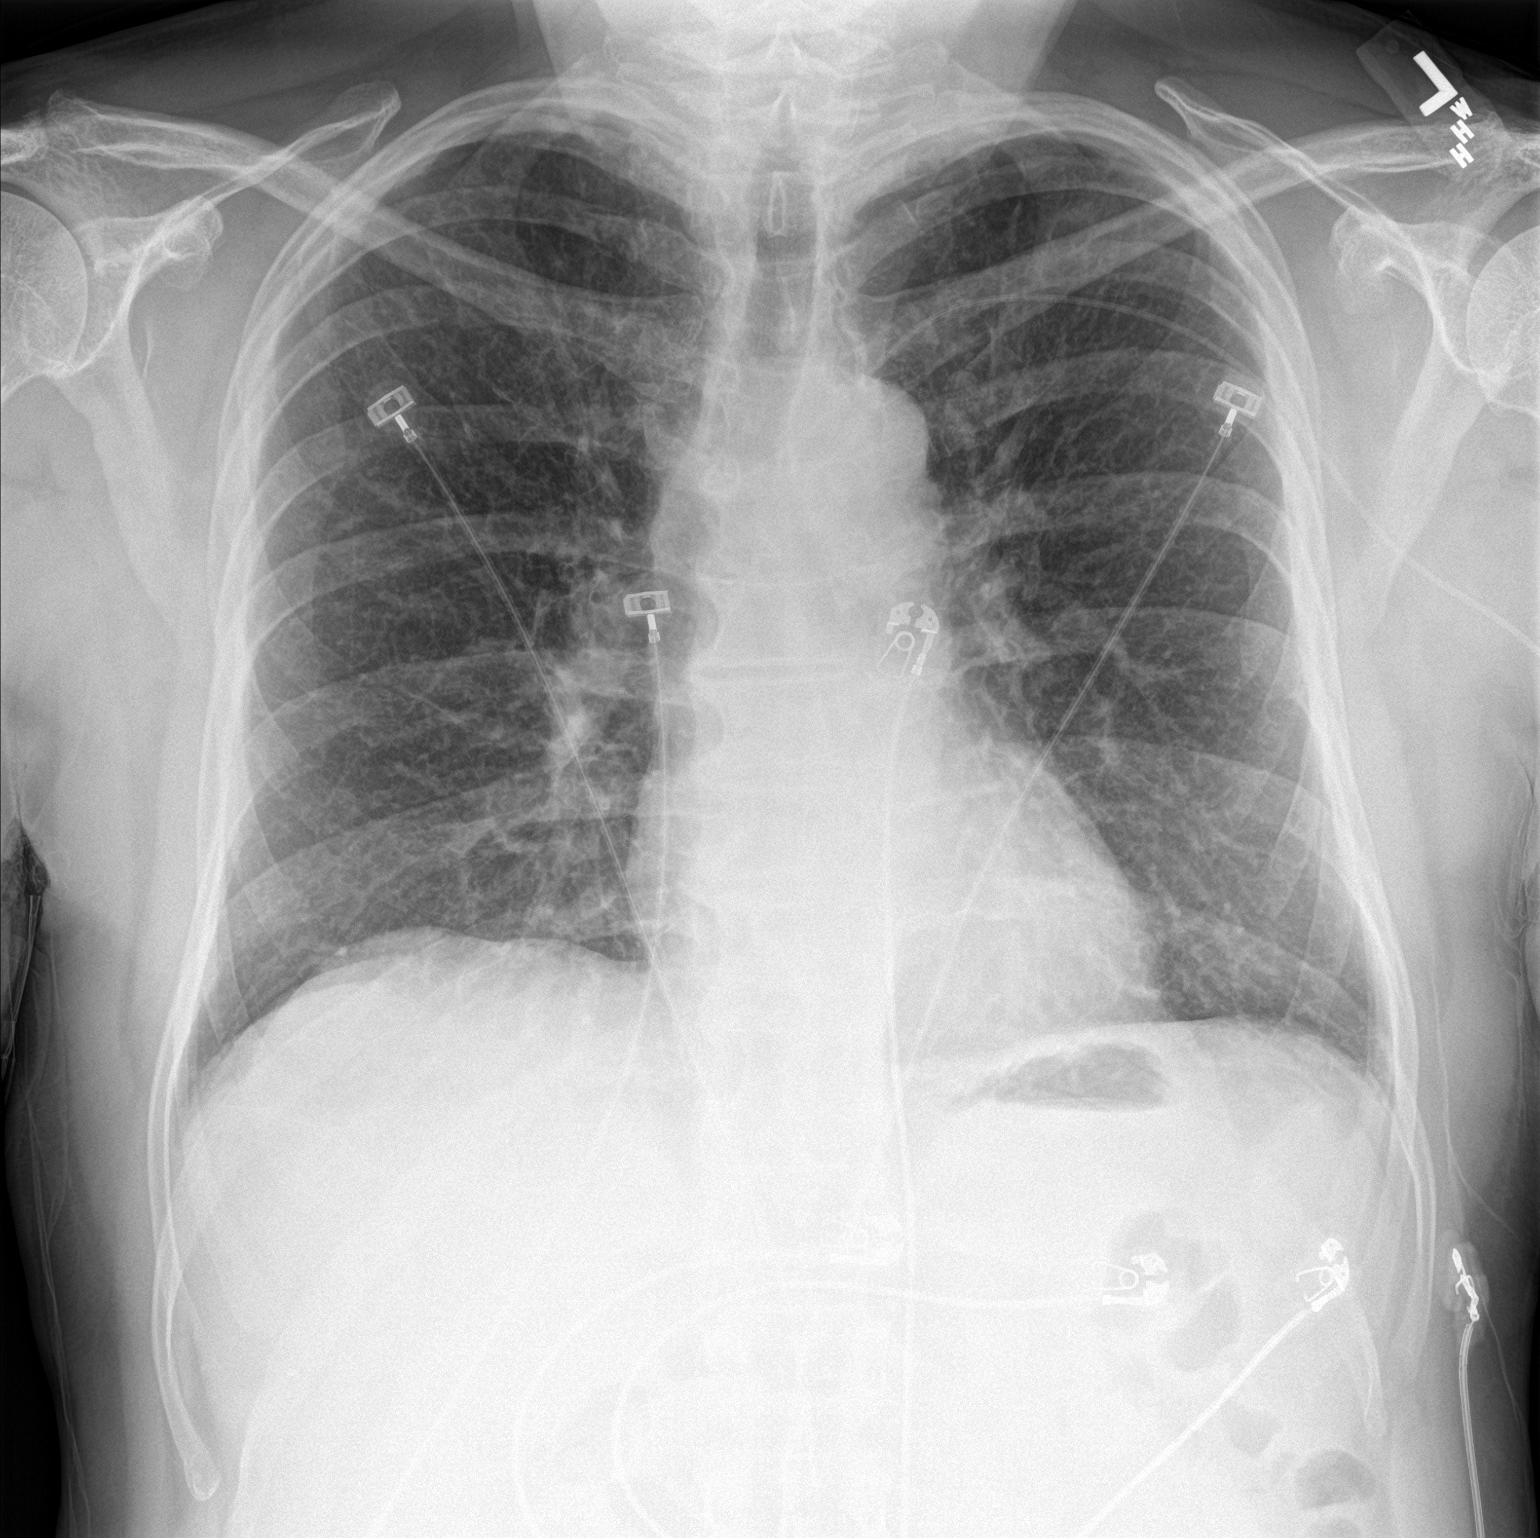

[chest lat]
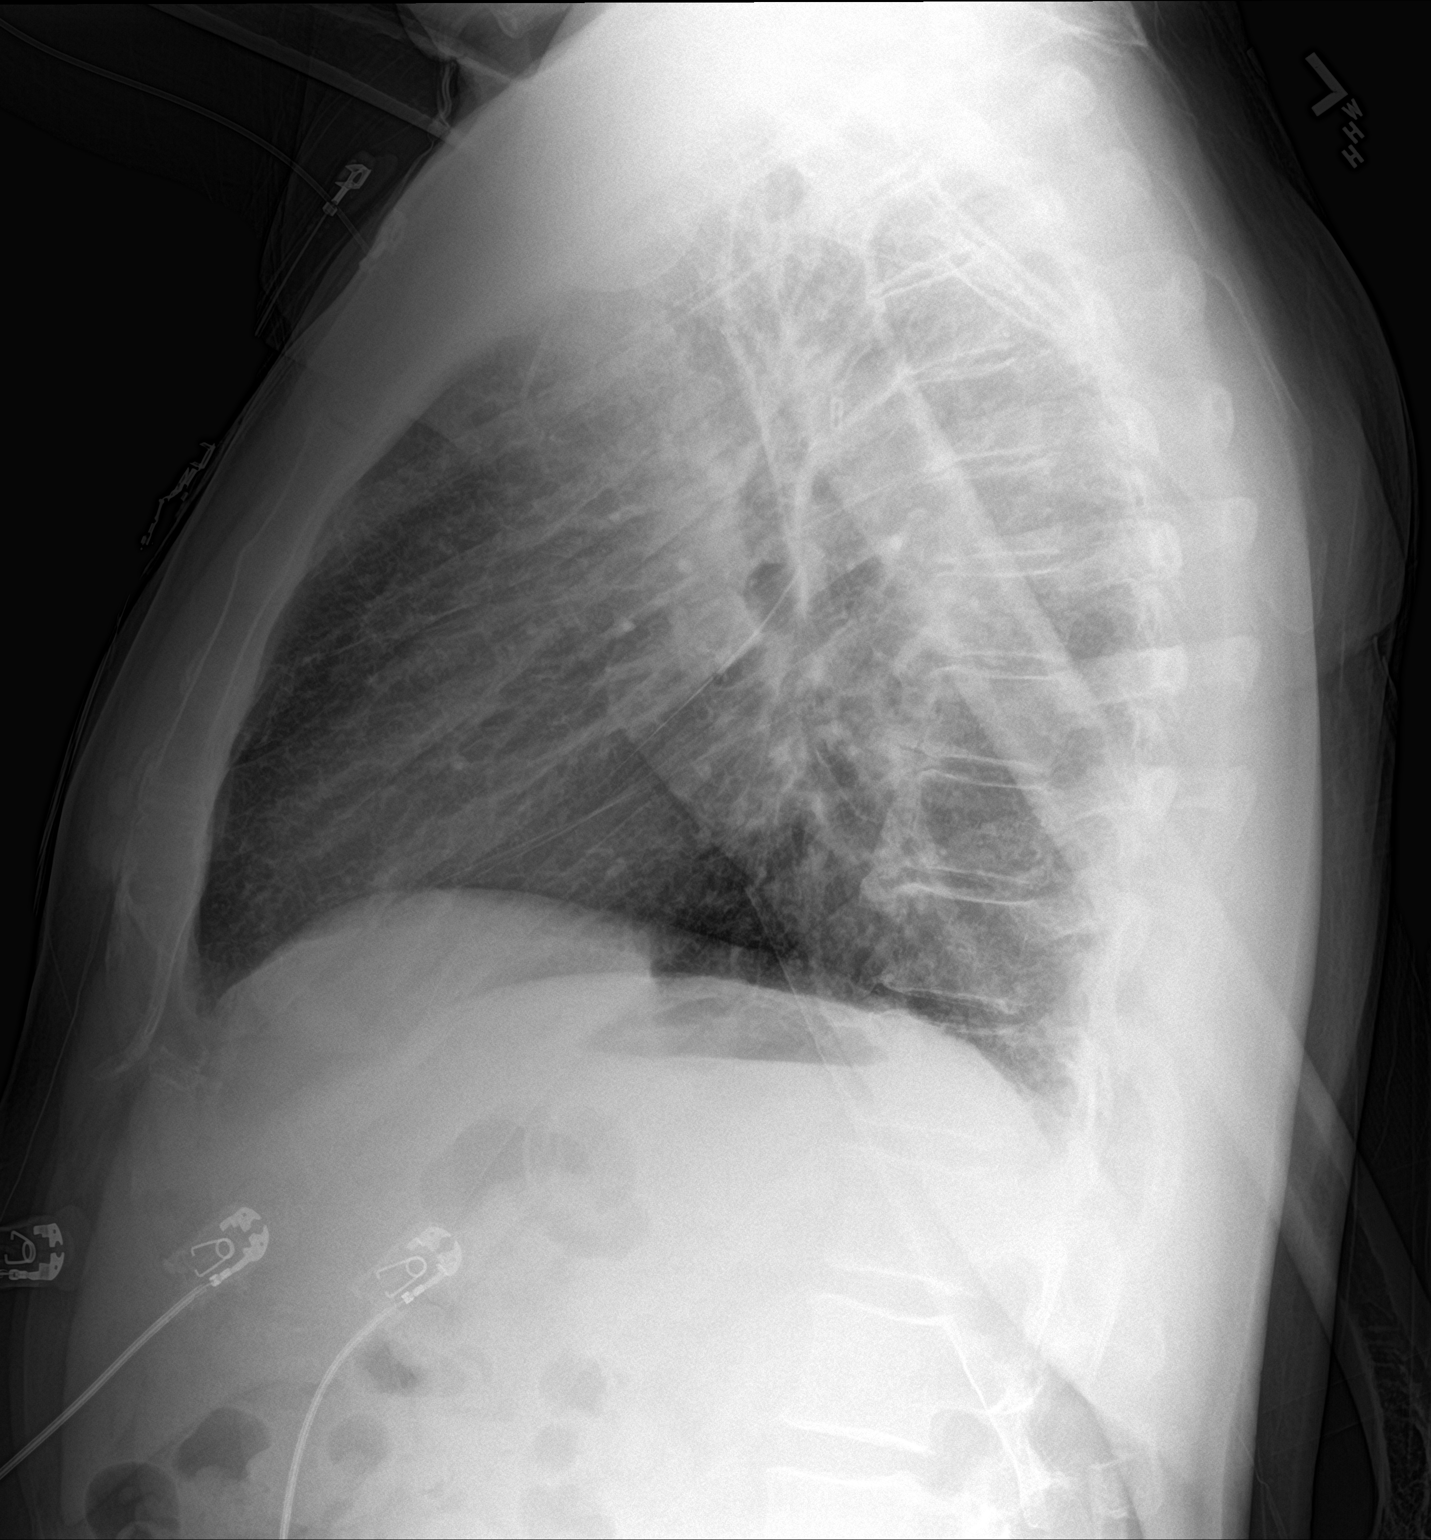

[2 of 2 positions shown; findings below may reference images not displayed]

FINDINGS: Left upper extremity PICC tip in the region of the distal
brachiocephalic vein/ proximal SVC. Minimal bibasilar atelectasis.
No consolidation. Normal heart size and mediastinal contours. No
pulmonary edema or pleural fluid. No pneumothorax. No acute osseous
abnormalities are seen.
IMPRESSION: Left upper extremity PICC tip in the distal brachiocephalic
vein/proximal SVC.

Minimal bibasilar atelectasis, no consolidation to suggest
pneumonia.

## 2019-04-06 NOTE — Therapy (Signed)
[facility] [address] [place], [place], [address] Phone: [phone]   Fax:  [phone]  Occupational Therapy Treatment  Patient Details  Name: Jacob Arellano MRN: [id number] Date of Birth: 03-20-1956 Referring Provider (OT): Dr. Victorino December   Encounter Date: 04/06/2019  OT End of Session - 04/06/19 1608    Visit Number  6    Number of Visits  16    Date for OT Re-Evaluation  05/14/19   mini reassess on 8/20   Authorization Type  [employer]    Authorization Time Period  30 visits OT/PT combined, able to request additional visits if medically necessary    Authorization - Visit Number  6    Authorization - Number of Visits  30    OT Start Time  4259   patient arrived late   OT Stop Time  1612    OT Time Calculation (min)  29 min    Activity Tolerance  Patient tolerated treatment well    Behavior During Therapy  [facility] for tasks assessed/performed       Past Medical History:  Diagnosis Date  . Depression   . Hypercholesteremia   . Hypertension   . Rotator cuff arthropathy of left shoulder 02/23/2019  . Sleep apnea    no CPAP, mild diagnosis no CPAP needed    Past Surgical History:  Procedure Laterality Date  . BACK SURGERY     a couple  . broken nose    . CATARACT EXTRACTION W/PHACO Right 11/01/2012   Procedure: CATARACT EXTRACTION PHACO AND INTRAOCULAR LENS PLACEMENT (IOC);  Surgeon: Tonny Branch, MD;  Location: [facility];  Service: Ophthalmology;  Laterality: Right;  CDE=7.50  . CATARACT EXTRACTION W/PHACO Left 11/15/2012   Procedure: CATARACT EXTRACTION PHACO AND INTRAOCULAR LENS PLACEMENT (IOC);  Surgeon: Tonny Branch, MD;  Location: [facility];  Service: Ophthalmology;  Laterality: Left;  CDE 6.68  . COLONOSCOPY N/A 10/31/2016   Procedure: COLONOSCOPY;  Surgeon: Danie Binder, MD;  [facility];  Service: Endoscopy;  Laterality: N/A;  11:45 pm  . GANGLION CYST EXCISION     Bilateral hands  . left bicep repair    . NASAL  SEPTUM SURGERY    . REVERSE SHOULDER ARTHROPLASTY Left 02/23/2019   Procedure: REVERSE SHOULDER ARTHROPLASTY;  Surgeon: Nicholes Stairs, MD;  Location: [place];  Service: Orthopedics;  Laterality: Left;  137mins  . ROTATOR CUFF REPAIR     right 2 repairs and one reconstruction  . TONSILLECTOMY    . TOTAL HIP ARTHROPLASTY Right   . VITRECTOMY      There were no vitals filed for this visit.  Subjective Assessment - 04/06/19 1544    Subjective   S: Nothing new.    Currently in Pain?  No/denies         [facility] OT Assessment - 04/06/19 1552      Assessment   Medical Diagnosis  S/P Left Reverse Total Shoulder Replacement      Precautions   Precautions  Shoulder    Type of Shoulder Precautions  follow protocol:  through 03/23/19 passive range to external rotaiton 30 degrees, abduction 60 degrees, flexion 90 degrees.  may begin aa/arom within same restrictions, 7/29-8/26 progress p/rom to external rotation 45, abduction 90, flexion 115, 8/26 and beyond push range to tolerance               OT Treatments/Exercises (OP) - 04/06/19 1552      Exercises  Exercises  Shoulder      Shoulder Exercises: Supine   Protraction  PROM;5 reps    Horizontal ABduction  PROM;5 reps    External Rotation  PROM;5 reps    External Rotation Limitations  to 45 degrees     Internal Rotation  PROM;5 reps    Flexion  PROM;5 reps    Flexion Limitations  to 115 degrees    ABduction  PROM;5 reps    ABduction Limitations  to 90 degrees       Shoulder Exercises: Standing   Protraction  AAROM;15 reps    Horizontal ABduction  AAROM;12 reps    External Rotation  AAROM;15 reps    External Rotation Limitations  to 45 degrees    Internal Rotation  AAROM;15 reps    Flexion  AAROM;15 reps    Flexion Limitations  to 115 degrees    ABduction Limitations  to 90 degrees    Extension  Theraband;10 reps    Theraband Level (Shoulder Extension)  Level 2 (Red)    Row  Theraband;10 reps    Theraband Level  (Shoulder Row)  Level 2 (Red)    Retraction  Theraband;10 reps    Theraband Level (Shoulder Retraction)  Level 2 (Red)      Shoulder Exercises: ROM/Strengthening   UBE (Upper Arm Bike)  level 1 2' forward 2' reverse   pace: 6.0-7.0   Wall Wash  1'    Thumb Tacks  1' one rest break    Proximal Shoulder Strengthening, Seated  12X A/ROM no rest breaks               OT Short Term Goals - 03/16/19 1604      OT SHORT TERM GOAL #1   Title  Pt will understand and be independent with the HEP provided to facilitate his progress with ROM and strength.    Time  4    Period  Weeks    Status  On-going    Target Date  04/14/19      OT SHORT TERM GOAL #2   Title  Patient will improve left shoulder P/ROM to [facility] in order to don and doff shirts with increased ease.    Time  4    Period  Weeks    Status  On-going      OT SHORT TERM GOAL #3   Title  Patient will improve left shoulder strength to 3+/5 or better for improved ability to complete household tasks.    Time  4    Period  Weeks    Status  On-going      OT SHORT TERM GOAL #4   Title  Patient will decrease pain in his left shoulder to 3/10 or better during adl completion.    Time  4    Period  Weeks    Status  On-going      OT SHORT TERM GOAL #5   Title  Patient will decrease fascial restrictions to min- moderate in his left shoulder region for greater mobility needed for ADL completion.    Time  4    Period  Weeks    Status  On-going        OT Long Term Goals - 03/16/19 1604      OT LONG TERM GOAL #1   Title  Pt will return to prior level of independence with all daily and work activities using his left arm actively.    Time  8    Period  Weeks    Status  On-going      OT LONG TERM GOAL #2   Title  Pt will have decreased fascial restrictions to trace in his left shoulder region to relieve tightness and tenderness to facilitate better ROM.    Time  8    Period  Weeks    Status  On-going      OT LONG TERM GOAL  #4   Title  Pt will have strength of 4+/5 or better in his left arm for ADL tasks such as yard work and heavy household chores.    Time  8    Period  Weeks    Status  On-going      OT LONG TERM GOAL #5   Title  Patients A/ROM of  left arm will be within normal limits to increase his ability to complete all overhead reaching activities.    Time  8    Period  Weeks    Status  On-going            Plan - 04/06/19 1609    Clinical Impression Statement  A: Manual therapy not completed this session due to no pain report and late arrival. Patient completed AA/ROM supine and standing with VC for form and technique. Reports mild discomfort during end stretch within ROM protocol. Added scapular theraband to HEP with patient demonstrating understanding. UBE bike added to increase shoulder stability.    Body Structure / Function / Physical Skills  ADL;Strength;Pain;UE functional use;ROM;IADL;Fascial restriction;Scar mobility;Muscle spasms    Plan  P: Progress to A/ROM at week 8 (8/26). Add rhythmic stabilization (supine and standing), standing isometrics.    Consulted and Agree with Plan of Care  Patient       Patient will benefit from skilled therapeutic intervention in order to improve the following deficits and impairments:   Body Structure / Function / Physical Skills: ADL, Strength, Pain, UE functional use, ROM, IADL, Fascial restriction, Scar mobility, Muscle spasms       Visit Diagnosis: 1. Other symptoms and signs involving the musculoskeletal system   2. Acute pain of left shoulder   3. Stiffness of left shoulder, not elsewhere classified       Problem List Patient Active Problem List   Diagnosis Date Noted  . Rotator cuff arthropathy of left shoulder 02/23/2019  . 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 Hip pain, acute, right 11/17/2017  . Special screening for malignant neoplasms, colon    Ailene Ravel, OTR/L,CBIS  [phone]  04/06/2019,  4:26 PM  [facility] [address] [place], [place], [address] Phone: [phone]   Fax:  [phone]  Name: MAXFIELD GILDERSLEEVE MRN: [id number] Date of Birth: 1956/08/09

## 2019-04-06 NOTE — Patient Instructions (Signed)

## 2019-04-11 ENCOUNTER — Ambulatory Visit (HOSPITAL_COMMUNITY): Payer: No Typology Code available for payment source | Admitting: Occupational Therapy

## 2019-04-11 ENCOUNTER — Telehealth (HOSPITAL_COMMUNITY): Payer: Self-pay | Admitting: Internal Medicine

## 2019-04-11 NOTE — Telephone Encounter (Signed)
04/11/19  pt called to reschedule he said that something came up at work... rescheduled for Tuesday 8/18 at 330

## 2019-04-12 ENCOUNTER — Ambulatory Visit (HOSPITAL_COMMUNITY): Payer: No Typology Code available for payment source

## 2019-04-12 ENCOUNTER — Other Ambulatory Visit: Payer: Self-pay

## 2019-04-12 DIAGNOSIS — M25512 Pain in left shoulder: Secondary | ICD-10-CM

## 2019-04-12 DIAGNOSIS — R29898 Other symptoms and signs involving the musculoskeletal system: Secondary | ICD-10-CM

## 2019-04-12 DIAGNOSIS — M25612 Stiffness of left shoulder, not elsewhere classified: Secondary | ICD-10-CM

## 2019-04-12 NOTE — Therapy (Signed)
[facility] [place], [place], [address] Phone: [phone]   Fax:  [phone]  Occupational Therapy Treatment  Patient Details  Name: Jacob Arellano MRN: [id number] Date of Birth: March 10, 1956 Referring Provider (OT): Dr. Victorino December   Encounter Date: 04/12/2019  OT End of Session - 04/12/19 1605    Visit Number  7    Number of Visits  16    Date for OT Re-Evaluation  05/14/19   mini reassess on 8/20   Authorization Type  [employer]    Authorization Time Period  30 visits OT/PT combined, able to request additional visits if medically necessary    Authorization - Visit Number  7    Authorization - Number of Visits  30    OT Start Time  1534    OT Stop Time  1612    OT Time Calculation (min)  38 min    Activity Tolerance  Patient tolerated treatment well    Behavior During Therapy  [facility] for tasks assessed/performed       Past Medical History:  Diagnosis Date  . Depression   . Hypercholesteremia   . Hypertension   . Rotator cuff arthropathy of left shoulder 02/23/2019  . Sleep apnea    no CPAP, mild diagnosis no CPAP needed    Past Surgical History:  Procedure Laterality Date  . BACK SURGERY     a couple  . broken nose    . CATARACT EXTRACTION W/PHACO Right 11/01/2012   Procedure: CATARACT EXTRACTION PHACO AND INTRAOCULAR LENS PLACEMENT (IOC);  Surgeon: Tonny Branch, MD;  [facility]: [facility];  Service: Ophthalmology;  Laterality: Right;  CDE=7.50  . CATARACT EXTRACTION W/PHACO Left 11/15/2012   Procedure: CATARACT EXTRACTION PHACO AND INTRAOCULAR LENS PLACEMENT (IOC);  Surgeon: Tonny Branch, MD;  Location: [facility];  Service: Ophthalmology;  Laterality: Left;  CDE 6.68  . COLONOSCOPY N/A 10/31/2016   Procedure: COLONOSCOPY;  Surgeon: Danie Binder, MD;  [facility];  Service: Endoscopy;  Laterality: N/A;  11:45 pm  . GANGLION CYST EXCISION     Bilateral hands  . left bicep repair    . NASAL SEPTUM SURGERY    .  REVERSE SHOULDER ARTHROPLASTY Left 02/23/2019   Procedure: REVERSE SHOULDER ARTHROPLASTY;  Surgeon: Nicholes Stairs, MD;  Location: [place];  Service: Orthopedics;  Laterality: Left;  180mins  . ROTATOR CUFF REPAIR     right 2 repairs and one reconstruction  . TONSILLECTOMY    . TOTAL HIP ARTHROPLASTY Right   . VITRECTOMY      There were no vitals filed for this visit.  Subjective Assessment - 04/12/19 1627    Subjective   S: My arm is doing good.    Currently in Pain?  No/denies         [facility] OT Assessment - 04/12/19 1627      Assessment   Medical Diagnosis  S/P Left Reverse Total Shoulder Replacement      Precautions   Precautions  Shoulder    Type of Shoulder Precautions  follow protocol:  through 03/23/19 passive range to external rotaiton 30 degrees, abduction 60 degrees, flexion 90 degrees.  may begin aa/arom within same restrictions, 7/29-8/26 progress p/rom to external rotation 45, abduction 90, flexion 115, 8/26 and beyond push range to tolerance               OT Treatments/Exercises (OP) - 04/12/19 1548      Exercises   Exercises  Shoulder      Shoulder Exercises: Supine   Protraction  PROM;5 reps    Horizontal ABduction  PROM;5 reps    External Rotation  PROM;5 reps    External Rotation Limitations  to 45 degrees     Internal Rotation  PROM;5 reps    Flexion  PROM;5 reps    Flexion Limitations  to 115 degrees    ABduction  PROM;5 reps    ABduction Limitations  to 90 degrees       Shoulder Exercises: Standing   Protraction  AAROM;15 reps    Horizontal ABduction  AAROM;15 reps    External Rotation  AAROM;15 reps    External Rotation Limitations  to 45 degrees    Internal Rotation  AAROM;15 reps    Flexion  AAROM;15 reps    Flexion Limitations  to 115 degrees    ABduction Limitations  to 90 degrees    Extension  Theraband;10 reps    Theraband Level (Shoulder Extension)  Level 2 (Red)    Row  Theraband;10 reps    Theraband Level (Shoulder Row)   Level 2 (Red)    Retraction  Theraband;10 reps    Theraband Level (Shoulder Retraction)  Level 2 (Red)      Shoulder Exercises: ROM/Strengthening   UBE (Upper Arm Bike)  level 1 2' forward 2' reverse   pace: 6.0-7.0   Wall Wash  1'    Thumb Tacks  1'    Rhythmic Stabilization, Supine  1' at 90 degrees 1' at 25% range    Rhythmic Stabilization, Seated  1' at 90 degrees      Shoulder Exercises: Isometric Strengthening   Flexion  --   1x30" standing   Extension  --   1x30" standing   External Rotation  --   1x30" standing   Internal Rotation  --   1x30" standing   ABduction  --   1x30" standing   ADduction  --   1x30" standing              OT Short Term Goals - 03/16/19 1604      OT SHORT TERM GOAL #1   Title  Pt will understand and be independent with the HEP provided to facilitate his progress with ROM and strength.    Time  4    Period  Weeks    Status  On-going    Target Date  04/14/19      OT SHORT TERM GOAL #2   Title  Patient will improve left shoulder P/ROM to [facility] in order to don and doff shirts with increased ease.    Time  4    Period  Weeks    Status  On-going      OT SHORT TERM GOAL #3   Title  Patient will improve left shoulder strength to 3+/5 or better for improved ability to complete household tasks.    Time  4    Period  Weeks    Status  On-going      OT SHORT TERM GOAL #4   Title  Patient will decrease pain in his left shoulder to 3/10 or better during adl completion.    Time  4    Period  Weeks    Status  On-going      OT SHORT TERM GOAL #5   Title  Patient will decrease fascial restrictions to min- moderate in his left shoulder region for greater mobility needed for ADL completion.  Time  4    Period  Weeks    Status  On-going        OT Long Term Goals - 03/16/19 1604      OT LONG TERM GOAL #1   Title  Pt will return to prior level of independence with all daily and work activities using his left arm actively.    Time  8     Period  Weeks    Status  On-going      OT LONG TERM GOAL #2   Title  Pt will have decreased fascial restrictions to trace in his left shoulder region to relieve tightness and tenderness to facilitate better ROM.    Time  8    Period  Weeks    Status  On-going      OT LONG TERM GOAL #4   Title  Pt will have strength of 4+/5 or better in his left arm for ADL tasks such as yard work and heavy household chores.    Time  8    Period  Weeks    Status  On-going      OT LONG TERM GOAL #5   Title  Patients A/ROM of  left arm will be within normal limits to increase his ability to complete all overhead reaching activities.    Time  8    Period  Weeks    Status  On-going            Plan - 04/12/19 1606    Clinical Impression Statement  A: Added rhythmic stabilization standing and supine to further strengthening scapular stability. Add isometric strengthening standing also this session. Patient required VC for form and technique as needed. Less rest breaks required during activities although patient continues to voice muscle fatigue.    Body Structure / Function / Physical Skills  ADL;Strength;Pain;UE functional use;ROM;IADL;Fascial restriction;Scar mobility;Muscle spasms    Plan  P: Mini reassessment. FOTO. review goals and HEP.    Consulted and Agree with Plan of Care  Patient       Patient will benefit from skilled therapeutic intervention in order to improve the following deficits and impairments:   Body Structure / Function / Physical Skills: ADL, Strength, Pain, UE functional use, ROM, IADL, Fascial restriction, Scar mobility, Muscle spasms       Visit Diagnosis: 1. Other symptoms and signs involving the musculoskeletal system   2. Acute pain of left shoulder   3. Stiffness of left shoulder, not elsewhere classified       Problem List Patient Active Problem List   Diagnosis Date Noted  . Rotator cuff arthropathy of left shoulder 02/23/2019  . S/P reverse total  shoulder arthroplasty, left 02/23/2019  . Hip pain, acute, right 11/17/2017  . Special screening for malignant neoplasms, colon    Ailene Ravel, OTR/L,CBIS  [phone]  04/12/2019, 4:29 PM  [facility] [address] [place], [place], [address] Phone: [phone]   Fax:  [phone]  Name: Jacob Arellano MRN: [id number] Date of Birth: [birth year]

## 2019-04-13 ENCOUNTER — Encounter (HOSPITAL_COMMUNITY): Payer: Self-pay

## 2019-04-13 ENCOUNTER — Ambulatory Visit (HOSPITAL_COMMUNITY): Payer: No Typology Code available for payment source

## 2019-04-13 DIAGNOSIS — R29898 Other symptoms and signs involving the musculoskeletal system: Secondary | ICD-10-CM

## 2019-04-13 DIAGNOSIS — M25512 Pain in left shoulder: Secondary | ICD-10-CM | POA: Diagnosis not present

## 2019-04-13 DIAGNOSIS — M25612 Stiffness of left shoulder, not elsewhere classified: Secondary | ICD-10-CM

## 2019-04-13 NOTE — Therapy (Signed)
[place], [place], [address] Phone: [phone]   Fax:  [phone]  Occupational Therapy Treatment Mini reassessment Patient Details  Name: Jacob Arellano MRN: [id number] Date of Birth: 1956/02/17 Referring Provider (OT): Dr. Victorino December   Encounter Date: 04/13/2019  OT End of Session - 04/13/19 1601    Visit Number  8    Number of Visits  16    Date for OT Re-Evaluation  05/14/19    Authorization Type  [employer]    Authorization Time Period  30 visits OT/PT combined, able to request additional visits if medically necessary    Authorization - Visit Number  8    Authorization - Number of Visits  30    OT Start Time  1540   reassessment. Pt arrived late   OT Stop Time  1615    OT Time Calculation (min)  35 min    Activity Tolerance  Patient tolerated treatment well    Behavior During Therapy  WFL for tasks assessed/performed       Past Medical History:  Diagnosis Date  . Depression   . Hypercholesteremia   . Hypertension   . Rotator cuff arthropathy of left shoulder 02/23/2019  . Sleep apnea    no CPAP, mild diagnosis no CPAP needed    Past Surgical History:  Procedure Laterality Date  . BACK SURGERY     a couple  . broken nose    . CATARACT EXTRACTION W/PHACO Right 11/01/2012   Procedure: CATARACT EXTRACTION PHACO AND INTRAOCULAR LENS PLACEMENT (IOC);  Surgeon: Tonny Branch, MD;  [facility];  Service: Ophthalmology;  Laterality: Right;  CDE=7.50  . CATARACT EXTRACTION W/PHACO Left 11/15/2012   Procedure: CATARACT EXTRACTION PHACO AND INTRAOCULAR LENS PLACEMENT (IOC);  Surgeon: Tonny Branch, MD;  Location: [facility];  Service: Ophthalmology;  Laterality: Left;  CDE 6.68  . COLONOSCOPY N/A 10/31/2016   Procedure: COLONOSCOPY;  Surgeon: Danie Binder, MD;  [facility];  Service: Endoscopy;  Laterality: N/A;  11:45 pm  . GANGLION CYST EXCISION     Bilateral hands  . left bicep repair    .  NASAL SEPTUM SURGERY    . REVERSE SHOULDER ARTHROPLASTY Left 02/23/2019   Procedure: REVERSE SHOULDER ARTHROPLASTY;  Surgeon: Nicholes Stairs, MD;  Location: [facility];  Service: Orthopedics;  Laterality: Left;  117mns  . ROTATOR CUFF REPAIR     right 2 repairs and one reconstruction  . TONSILLECTOMY    . TOTAL HIP ARTHROPLASTY Right   . VITRECTOMY      There were no vitals filed for this visit.  Subjective Assessment - 04/13/19 1614    Subjective   S: I'm surprised I'm not sore from yesterday.    Currently in Pain?  No/denies         [facility] 04[facility] 15[facility]      Assessment   Medical Diagnosis  S/P Left Reverse Total Shoulder Replacement      Precautions   Precautions  Shoulder    Type of Shoulder Precautions  follow protocol:  through 03/23/19 passive range to external rotaiton 30 degrees, abduction 60 degrees, flexion 90 degrees.  may begin aa/arom within same restrictions, 7/29-8/26 progress p/rom to external rotation 45, abduction 90, flexion 115, 8/26 and beyond push range to tolerance      Observation/Other Assessments   Focus on Therapeutic Outcomes (FOTO)   59/100      ROM / Strength  AROM / PROM / Strength  AROM;PROM;Strength      AROM   Overall AROM Comments  Assessed seated. IR/er adducted. A/ROM not assessed prior to this date.    AROM Assessment Site  Shoulder    Right/Left Shoulder  Left    Left Shoulder Flexion  140 Degrees    Left Shoulder ABduction  138 Degrees    Left Shoulder Internal Rotation  90 Degrees    Left Shoulder External Rotation  46 Degrees      PROM   Overall PROM Comments  assessed in supine.  external and internal rotation with shoulder adducted    PROM Assessment Site  Shoulder    Right/Left Shoulder  Left    Left Shoulder Flexion  140 Degrees   previous: 125   Left Shoulder ABduction  105 Degrees   previous: 85   Left Shoulder Internal Rotation  90 Degrees   previous: 50   Left Shoulder External Rotation  52 Degrees    previous: 34     Strength   Overall Strength Comments  Assessed seated. IR/er adducted. Not assessed prior to this date.     Strength Assessment Site  Shoulder    Right/Left Shoulder  Left    Left Shoulder Flexion  4-/5    Left Shoulder ABduction  4-/5    Left Shoulder Internal Rotation  5/5    Left Shoulder External Rotation  3/5               OT Treatments/Exercises (OP) - 04/13/19 1558      Exercises   Exercises  Shoulder      Shoulder Exercises: Standing   Protraction  AAROM;15 reps    Horizontal ABduction  AAROM;15 reps    Flexion  AAROM;15 reps    ABduction  AAROM;15 reps    Extension  Theraband;15 reps    Theraband Level (Shoulder Extension)  Level 2 (Red)    Row  Theraband;15 reps    Theraband Level (Shoulder Row)  Level 2 (Red)    Retraction  Theraband;15 reps    Theraband Level (Shoulder Retraction)  Level 2 (Red)      Shoulder Exercises: ROM/Strengthening   UBE (Upper Arm Bike)  level 1 2' forward 2' reverse   pace: 6.0-7.0   Wall Wash  1'    Proximal Shoulder Strengthening, Seated  12X A/ROM no rest breaks               OT Short Term Goals - 04/13/19 1556      OT SHORT TERM GOAL #1   Title  Pt will understand and be independent with the HEP provided to facilitate his progress with ROM and strength.    Time  4    Period  Weeks    Status  Achieved    Target Date  04/14/19      OT SHORT TERM GOAL #2   Title  Patient will improve left shoulder P/ROM to [facility] in order to don and doff shirts with increased ease.    Time  4    Period  Weeks    Status  Achieved      OT SHORT TERM GOAL #3   Title  Patient will improve left shoulder strength to 3+/5 or better for improved ability to complete household tasks.    Time  4    Period  Weeks    Status  Achieved      OT SHORT TERM GOAL #4   Title  Patient will decrease pain in his left shoulder to 3/10 or better during adl completion.    Time  4    Period  Weeks    Status  Achieved      OT  SHORT TERM GOAL #5   Title  Patient will decrease fascial restrictions to min- moderate in his left shoulder region for greater mobility needed for ADL completion.    Time  4    Period  Weeks    Status  Achieved        OT Long Term Goals - 04/13/19 1557      OT LONG TERM GOAL #1   Title  Pt will return to prior level of independence with all daily and work activities using his left arm actively.    Time  8    Period  Weeks    Status  On-going      OT LONG TERM GOAL #2   Title  Pt will have decreased fascial restrictions to trace in his left shoulder region to relieve tightness and tenderness to facilitate better ROM.    Time  8    Period  Weeks    Status  Achieved      OT LONG TERM GOAL #4   Title  Pt will have strength of 4+/5 or better in his left arm for ADL tasks such as yard work and heavy household chores.    Time  8    Period  Weeks    Status  On-going      OT LONG TERM GOAL #5   Title  Patients A/ROM of  left arm will be within normal limits to increase his ability to complete all overhead reaching activities.    Time  8    Period  Weeks    Status  On-going            Plan - 04/13/19 1609    Clinical Impression Statement  A: Patient reports that he is able to complete waist level activities fairly well. He can get his shirts on and off. If he reaches about his shoulder level he will experience pain and sometimes pain with the bringing his arm down. He has met all short term goals at this time and is progressing well in therapy. VC were required for form and technique during session.    Body Structure / Function / Physical Skills  ADL;Strength;Pain;UE functional use;ROM;IADL;Fascial restriction;Scar mobility;Muscle spasms    Plan  P: Continue OT services focuses on increasing ROM and strength and decreasing pain during functional tasks. Complete A/ROM Supine.    Consulted and Agree with Plan of Care  Patient       Patient will benefit from skilled therapeutic  intervention in order to improve the following deficits and impairments:   Body Structure / Function / Physical Skills: ADL, Strength, Pain, UE functional use, ROM, IADL, Fascial restriction, Scar mobility, Muscle spasms       Visit Diagnosis: 1. Other symptoms and signs involving the musculoskeletal system   2. Acute pain of left shoulder   3. Stiffness of left shoulder, not elsewhere classified       Problem List Patient Active Problem List   Diagnosis Date Noted  . Rotator cuff arthropathy of left shoulder 02/23/2019  . 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 Hip pain, acute, right 11/17/2017  . Special screening for malignant neoplasms, colon    Ailene Ravel, OTR/L,CBIS  [phone]  04/13/2019, 4:14 PM  [facility]  [place], [place], [address] Phone: [phone]   Fax:  [phone]  Name: MYKLE PASCUA MRN: [id number] Date of Birth: 01-31-1956

## 2019-04-18 ENCOUNTER — Other Ambulatory Visit: Payer: Self-pay

## 2019-04-18 ENCOUNTER — Ambulatory Visit (HOSPITAL_COMMUNITY): Payer: No Typology Code available for payment source | Admitting: Occupational Therapy

## 2019-04-18 DIAGNOSIS — M25512 Pain in left shoulder: Secondary | ICD-10-CM

## 2019-04-18 DIAGNOSIS — R29898 Other symptoms and signs involving the musculoskeletal system: Secondary | ICD-10-CM

## 2019-04-18 DIAGNOSIS — M25612 Stiffness of left shoulder, not elsewhere classified: Secondary | ICD-10-CM

## 2019-04-18 NOTE — Therapy (Signed)
[place], [place], [address] Phone: [phone]   Fax:  [phone]  Occupational Therapy Treatment  Patient Details  Name: Jacob Arellano MRN: [id number] Date of Birth: [birth year] Referring Provider (OT): Dr. Victorino December   Encounter Date: 04/18/2019  OT End of Session - 04/18/19 1619    Visit Number  9    Number of Visits  16    Date for OT Re-Evaluation  05/14/19    Authorization Type  [employer]    Authorization Time Period  30 visits OT/PT combined, able to request additional visits if medically necessary    Authorization - Visit Number  9    Authorization - Number of Visits  30    OT Start Time  1540    OT Stop Time  1618    OT Time Calculation (min)  38 min    Activity Tolerance  Patient tolerated treatment well    Behavior During Therapy  [facility] for tasks assessed/performed       Past Medical History:  Diagnosis Date  . Depression   . Hypercholesteremia   . Hypertension   . Rotator cuff arthropathy of left shoulder 02/23/2019  . Sleep apnea    no CPAP, mild diagnosis no CPAP needed    Past Surgical History:  Procedure Laterality Date  . BACK SURGERY     a couple  . broken nose    . CATARACT EXTRACTION W/PHACO Right 11/01/2012   Procedure: CATARACT EXTRACTION PHACO AND INTRAOCULAR LENS PLACEMENT (IOC);  Surgeon: Tonny Branch, MD;  [facility]: [facility];  Service: Ophthalmology;  Laterality: Right;  CDE=7.50  . CATARACT EXTRACTION W/PHACO Left 11/15/2012   Procedure: CATARACT EXTRACTION PHACO AND INTRAOCULAR LENS PLACEMENT (IOC);  Surgeon: Tonny Branch, MD;  Location: [facility];  Service: Ophthalmology;  Laterality: Left;  CDE 6.68  . COLONOSCOPY N/A 10/31/2016   Procedure: COLONOSCOPY;  Surgeon: Danie Binder, MD;  [facility];  Service: Endoscopy;  Laterality: N/A;  11:45 pm  . GANGLION CYST EXCISION     Bilateral hands  . left bicep repair    . NASAL SEPTUM SURGERY    . REVERSE SHOULDER  ARTHROPLASTY Left 02/23/2019   Procedure: REVERSE SHOULDER ARTHROPLASTY;  Surgeon: Nicholes Stairs, MD;  Location: [place];  Service: Orthopedics;  Laterality: Left;  130mins  . ROTATOR CUFF REPAIR     right 2 repairs and one reconstruction  . TONSILLECTOMY    . TOTAL HIP ARTHROPLASTY Right   . VITRECTOMY      There were no vitals filed for this visit.                OT Treatments/Exercises (OP) - 04/18/19 1543      Exercises   Exercises  Shoulder      Shoulder Exercises: Supine   Protraction  PROM;5 reps;AROM;10 reps    Horizontal ABduction  PROM;5 reps;AROM;10 reps    External Rotation  PROM;5 reps;AROM;10 reps    External Rotation Limitations  to 45 degrees     Internal Rotation  PROM;5 reps;AROM;10 reps    Flexion  PROM;5 reps;AROM;10 reps    Flexion Limitations  to 115 degrees    ABduction  PROM;5 reps;AROM;10 reps    ABduction Limitations  to 100 degrees       Shoulder Exercises: Standing   Protraction  AROM;10 reps    Horizontal ABduction  AAROM;15 reps    External Rotation  AROM;15 reps  External Rotation Limitations  to 45 degrees    Internal Rotation  AROM;15 reps    Flexion  AAROM;15 reps    ABduction  AAROM;15 reps    Extension  Theraband;15 reps    Theraband Level (Shoulder Extension)  Level 2 (Red)    Row  Theraband;15 reps    Theraband Level (Shoulder Row)  Level 2 (Red)    Retraction  Theraband;15 reps    Theraband Level (Shoulder Retraction)  Level 2 (Red)      Shoulder Exercises: ROM/Strengthening   UBE (Upper Arm Bike)  level 1 3' forward 3' reverse   Pace: 6.0-7.0   Over Head Lace  2' seated    Ball on Wall  1' flexion      Shoulder Exercises: Stretch   External Rotation Stretch  2 reps   15 seconds   Wall Stretch - Flexion  2 reps   15 seconds   Wall Stretch - ABduction  2 reps   15 seconds              OT Short Term Goals - 04/13/19 1556      OT SHORT TERM GOAL #1   Title  Pt will understand and be  independent with the HEP provided to facilitate his progress with ROM and strength.    Time  4    Period  Weeks    Status  Achieved    Target Date  04/14/19      OT SHORT TERM GOAL #2   Title  Patient will improve left shoulder P/ROM to [facility] in order to don and doff shirts with increased ease.    Time  4    Period  Weeks    Status  Achieved      OT SHORT TERM GOAL #3   Title  Patient will improve left shoulder strength to 3+/5 or better for improved ability to complete household tasks.    Time  4    Period  Weeks    Status  Achieved      OT SHORT TERM GOAL #4   Title  Patient will decrease pain in his left shoulder to 3/10 or better during adl completion.    Time  4    Period  Weeks    Status  Achieved      OT SHORT TERM GOAL #5   Title  Patient will decrease fascial restrictions to min- moderate in his left shoulder region for greater mobility needed for ADL completion.    Time  4    Period  Weeks    Status  Achieved        OT Long Term Goals - 04/13/19 1557      OT LONG TERM GOAL #1   Title  Pt will return to prior level of independence with all daily and work activities using his left arm actively.    Time  8    Period  Weeks    Status  On-going      OT LONG TERM GOAL #2   Title  Pt will have decreased fascial restrictions to trace in his left shoulder region to relieve tightness and tenderness to facilitate better ROM.    Time  8    Period  Weeks    Status  Achieved      OT LONG TERM GOAL #4   Title  Pt will have strength of 4+/5 or better in his left arm for ADL tasks such as yard work and  heavy household chores.    Time  8    Period  Weeks    Status  On-going      OT LONG TERM GOAL #5   Title  Patients A/ROM of  left arm will be within normal limits to increase his ability to complete all overhead reaching activities.    Time  8    Period  Weeks    Status  On-going            Plan - 04/18/19 1620    Clinical Impression Statement  A: Pt arrived  late for session, therefore no manual therapy completed with exception of passive stretching. Pt completing exercises with LUE, only incorporating RUE when necessary due to discomfort in RUE. Pt performing A/ROM supine, progressed to A/ROM er and protraction in standing. Added ball on wall and overhead lacing for shoulder stability work. Verbal cuing for form and technique.    Body Structure / Function / Physical Skills  ADL;Strength;Pain;UE functional use;ROM;IADL;Fascial restriction;Scar mobility;Muscle spasms    Plan  P: Continue to progress to A/ROM in standing, resume proximal shoulder strengthening and rhythmic stabilization exercises       Patient will benefit from skilled therapeutic intervention in order to improve the following deficits and impairments:   Body Structure / Function / Physical Skills: ADL, Strength, Pain, UE functional use, ROM, IADL, Fascial restriction, Scar mobility, Muscle spasms       Visit Diagnosis: Other symptoms and signs involving the musculoskeletal system  Acute pain of left shoulder  Stiffness of left shoulder, not elsewhere classified    Problem List Patient Active Problem List   Diagnosis Date Noted  . Rotator cuff arthropathy of left shoulder 02/23/2019  . 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 Hip pain, acute, right 11/17/2017  . Special screening for malignant neoplasms, colon    Guadelupe Sabin, OTR/L  [phone] 04/18/2019, 4:22 PM  [facility] [address] [place], [place], [address] Phone: [phone]   Fax:  [phone]  Name: Jacob Arellano MRN: [id number] Date of Birth: Dec 18, 1955

## 2019-04-20 ENCOUNTER — Ambulatory Visit (HOSPITAL_COMMUNITY): Payer: No Typology Code available for payment source

## 2019-04-25 ENCOUNTER — Ambulatory Visit (HOSPITAL_COMMUNITY): Payer: No Typology Code available for payment source | Admitting: Occupational Therapy

## 2019-04-25 ENCOUNTER — Other Ambulatory Visit: Payer: Self-pay

## 2019-04-25 ENCOUNTER — Encounter (HOSPITAL_COMMUNITY): Payer: Self-pay | Admitting: Occupational Therapy

## 2019-04-25 DIAGNOSIS — R29898 Other symptoms and signs involving the musculoskeletal system: Secondary | ICD-10-CM

## 2019-04-25 DIAGNOSIS — M25612 Stiffness of left shoulder, not elsewhere classified: Secondary | ICD-10-CM

## 2019-04-25 DIAGNOSIS — M25512 Pain in left shoulder: Secondary | ICD-10-CM | POA: Diagnosis not present

## 2019-04-25 NOTE — Therapy (Signed)
[place], [place], [address] Phone: [phone]   Fax:  [phone]  Occupational Therapy Treatment  Patient Details  Name: Jacob Arellano MRN: [id number] Date of Birth: 1955/12/29 Referring Provider (OT): Dr. Victorino December   Encounter Date: 04/25/2019  OT End of Session - 04/25/19 1653    Visit Number  10    Number of Visits  16    Date for OT Re-Evaluation  05/14/19    Authorization Type  [employer]    Authorization Time Period  30 visits OT/PT combined, able to request additional visits if medically necessary    Authorization - Visit Number  10    Authorization - Number of Visits  30    OT Start Time  1610    OT Stop Time  1648    OT Time Calculation (min)  38 min    Activity Tolerance  Patient tolerated treatment well    Behavior During Therapy  [facility] for tasks assessed/performed       Past Medical History:  Diagnosis Date  . Depression   . Hypercholesteremia   . Hypertension   . Rotator cuff arthropathy of left shoulder 02/23/2019  . Sleep apnea    no CPAP, mild diagnosis no CPAP needed    Past Surgical History:  Procedure Laterality Date  . BACK SURGERY     a couple  . broken nose    . CATARACT EXTRACTION W/PHACO Right 11/01/2012   Procedure: CATARACT EXTRACTION PHACO AND INTRAOCULAR LENS PLACEMENT (IOC);  Surgeon: Tonny Branch, MD;  [facility]: [facility];  Service: Ophthalmology;  Laterality: Right;  CDE=7.50  . CATARACT EXTRACTION W/PHACO Left 11/15/2012   Procedure: CATARACT EXTRACTION PHACO AND INTRAOCULAR LENS PLACEMENT (IOC);  Surgeon: Tonny Branch, MD;  [facility];  Service: Ophthalmology;  Laterality: Left;  CDE 6.68  . COLONOSCOPY N/A 10/31/2016   Procedure: COLONOSCOPY;  Surgeon: Danie Binder, MD;  [facility];  Service: Endoscopy;  Laterality: N/A;  11:45 pm  . GANGLION CYST EXCISION     Bilateral hands  . left bicep repair    . NASAL SEPTUM SURGERY    . REVERSE SHOULDER  ARTHROPLASTY Left 02/23/2019   Procedure: REVERSE SHOULDER ARTHROPLASTY;  Surgeon: Nicholes Stairs, MD;  Location: [place];  Service: Orthopedics;  Laterality: Left;  131mins  . ROTATOR CUFF REPAIR     right 2 repairs and one reconstruction  . TONSILLECTOMY    . TOTAL HIP ARTHROPLASTY Right   . VITRECTOMY      There were no vitals filed for this visit.  Subjective Assessment - 04/25/19 1608    Subjective   S: I might have slept on it wrong.    Currently in Pain?  No/denies         [facility] OT Assessment - 04/25/19 1608      Assessment   Medical Diagnosis  S/P Left Reverse Total Shoulder Replacement      Precautions   Precautions  Shoulder    Type of Shoulder Precautions  follow protocol:  through 03/23/19 passive range to external rotaiton 30 degrees, abduction 60 degrees, flexion 90 degrees.  may begin aa/arom within same restrictions, 7/29-8/26 progress p/rom to external rotation 45, abduction 90, flexion 115, 8/26 and beyond push range to tolerance               OT Treatments/Exercises (OP) - 04/25/19 1612      Exercises   Exercises  Shoulder  Shoulder Exercises: Supine   Protraction  PROM;5 reps;AROM;10 reps    Horizontal ABduction  PROM;5 reps;AROM;10 reps    External Rotation  PROM;5 reps;AROM;10 reps    External Rotation Limitations  to 45 degrees     Internal Rotation  PROM;5 reps;AROM;10 reps    Flexion  PROM;5 reps;AROM;10 reps    ABduction  PROM;5 reps;AROM;10 reps      Shoulder Exercises: Standing   Horizontal ABduction  AROM;12 reps    External Rotation  AROM;15 reps    Internal Rotation  AROM;15 reps    Flexion  AROM;12 reps    ABduction  AROM;12 reps    Extension  Theraband;15 reps    Theraband Level (Shoulder Extension)  Level 2 (Red)    Row  Theraband;15 reps    Theraband Level (Shoulder Row)  Level 2 (Red)    Retraction  Theraband;15 reps    Theraband Level (Shoulder Retraction)  Level 2 (Red)      Shoulder Exercises:  ROM/Strengthening   Over Head Lace  2' seated    Proximal Shoulder Strengthening, Supine  12X A/ROM, no rest breaks    Ball on Wall  1' flexion 1' abduction    Rhythmic Stabilization, Supine  30" at 45 and 90 degrees; min difficulty at 45 degrees and mod at 90 degrees    Other ROM/Strengthening Exercises  Pt passing small pink ball behind back working on IR, 10X      Shoulder Exercises: Stretch   Internal Rotation Stretch  3 reps   10"   External Rotation Stretch  3 reps;20 seconds    Wall Stretch - Flexion  3 reps;20 seconds    Wall Stretch - ABduction  3 reps;20 seconds               OT Short Term Goals - 04/13/19 1556      OT SHORT TERM GOAL #1   Title  Pt will understand and be independent with the HEP provided to facilitate his progress with ROM and strength.    Time  4    Period  Weeks    Status  Achieved    Target Date  04/14/19      OT SHORT TERM GOAL #2   Title  Patient will improve left shoulder P/ROM to [facility] in order to don and doff shirts with increased ease.    Time  4    Period  Weeks    Status  Achieved      OT SHORT TERM GOAL #3   Title  Patient will improve left shoulder strength to 3+/5 or better for improved ability to complete household tasks.    Time  4    Period  Weeks    Status  Achieved      OT SHORT TERM GOAL #4   Title  Patient will decrease pain in his left shoulder to 3/10 or better during adl completion.    Time  4    Period  Weeks    Status  Achieved      OT SHORT TERM GOAL #5   Title  Patient will decrease fascial restrictions to min- moderate in his left shoulder region for greater mobility needed for ADL completion.    Time  4    Period  Weeks    Status  Achieved        OT Long Term Goals - 04/13/19 1557      OT LONG TERM GOAL #1   Title  Pt  will return to prior level of independence with all daily and work activities using his left arm actively.    Time  8    Period  Weeks    Status  On-going      OT LONG TERM GOAL  #2   Title  Pt will have decreased fascial restrictions to trace in his left shoulder region to relieve tightness and tenderness to facilitate better ROM.    Time  8    Period  Weeks    Status  Achieved      OT LONG TERM GOAL #4   Title  Pt will have strength of 4+/5 or better in his left arm for ADL tasks such as yard work and heavy household chores.    Time  8    Period  Weeks    Status  On-going      OT LONG TERM GOAL #5   Title  Patients A/ROM of  left arm will be within normal limits to increase his ability to complete all overhead reaching activities.    Time  8    Period  Weeks    Status  On-going            Plan - 04/25/19 1629    Clinical Impression Statement  A: Pt able to progress to all A/ROM today and achieve ROM WFL. Added ball on wall in abduction and began working on improving IR with towel stretch and ball pass. Continued with shoulder stretches and scapular stability exercises. Verbal cuing for form and technique.    Body Structure / Function / Physical Skills  ADL;Strength;Pain;UE functional use;ROM;IADL;Fascial restriction;Scar mobility;Muscle spasms    Plan  P: Continue with shoulder stretches and add to HEP. Continue to work on improving IR and er, add ball pass behind head for er       Patient will benefit from skilled therapeutic intervention in order to improve the following deficits and impairments:   Body Structure / Function / Physical Skills: ADL, Strength, Pain, UE functional use, ROM, IADL, Fascial restriction, Scar mobility, Muscle spasms       Visit Diagnosis: Other symptoms and signs involving the musculoskeletal system  Acute pain of left shoulder  Stiffness of left shoulder, not elsewhere classified    Problem List Patient Active Problem List   Diagnosis Date Noted  . Rotator cuff arthropathy of left shoulder 02/23/2019  . 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 Hip pain, acute, right 11/17/2017  . Special  screening for malignant neoplasms, colon    Guadelupe Sabin, OTR/L  [phone] 04/25/2019, 4:55 PM  [facility] [address] [place], [place], [address] Phone: [phone]   Fax:  [phone]  Name: Jacob Arellano MRN: [id number] Date of Birth: 1956/07/28

## 2019-04-27 ENCOUNTER — Ambulatory Visit (HOSPITAL_COMMUNITY): Payer: No Typology Code available for payment source | Attending: Orthopedic Surgery

## 2019-04-27 ENCOUNTER — Encounter (HOSPITAL_COMMUNITY): Payer: Self-pay

## 2019-04-27 ENCOUNTER — Other Ambulatory Visit: Payer: Self-pay

## 2019-04-27 DIAGNOSIS — M25512 Pain in left shoulder: Secondary | ICD-10-CM | POA: Diagnosis present

## 2019-04-27 DIAGNOSIS — M25612 Stiffness of left shoulder, not elsewhere classified: Secondary | ICD-10-CM

## 2019-04-27 DIAGNOSIS — R29898 Other symptoms and signs involving the musculoskeletal system: Secondary | ICD-10-CM | POA: Diagnosis not present

## 2019-04-27 NOTE — Therapy (Signed)
[place], [place], [address] Phone: [phone]   Fax:  [phone]  Occupational Therapy Treatment  Patient Details  Name: Jacob Arellano MRN: [id number] Date of Birth: Feb 20, 1956 Referring Provider (OT): Dr. Victorino December   Encounter Date: 04/27/2019  OT End of Session - 04/27/19 1642    Visit Number  11    Number of Visits  16    Date for OT Re-Evaluation  05/14/19    Authorization Type  [employer]    Authorization Time Period  30 visits OT/PT combined, able to request additional visits if medically necessary    Authorization - Visit Number  11    Authorization - Number of Visits  30    OT Start Time  [id number]    OT Stop Time  1644    OT Time Calculation (min)  35 min    Activity Tolerance  Patient tolerated treatment well    Behavior During Therapy  [facility] for tasks assessed/performed       Past Medical History:  Diagnosis Date  . Depression   . Hypercholesteremia   . Hypertension   . Rotator cuff arthropathy of left shoulder 02/23/2019  . Sleep apnea    no CPAP, mild diagnosis no CPAP needed    Past Surgical History:  Procedure Laterality Date  . BACK SURGERY     a couple  . broken nose    . CATARACT EXTRACTION W/PHACO Right 11/01/2012   Procedure: CATARACT EXTRACTION PHACO AND INTRAOCULAR LENS PLACEMENT (IOC);  Surgeon: Tonny Branch, MD;  [facility];  Service: Ophthalmology;  Laterality: Right;  CDE=7.50  . CATARACT EXTRACTION W/PHACO Left 11/15/2012   Procedure: CATARACT EXTRACTION PHACO AND INTRAOCULAR LENS PLACEMENT (IOC);  Surgeon: Tonny Branch, MD;  Location: [facility];  Service: Ophthalmology;  Laterality: Left;  CDE 6.68  . COLONOSCOPY N/A 10/31/2016   Procedure: COLONOSCOPY;  Surgeon: Danie Binder, MD;  [facility];  Service: Endoscopy;  Laterality: N/A;  11:45 pm  . GANGLION CYST EXCISION     Bilateral hands  . left bicep repair    . NASAL SEPTUM SURGERY    . REVERSE SHOULDER  ARTHROPLASTY Left 02/23/2019   Procedure: REVERSE SHOULDER ARTHROPLASTY;  Surgeon: Nicholes Stairs, MD;  Location: [place];  Service: Orthopedics;  Laterality: Left;  12mins  . ROTATOR CUFF REPAIR     right 2 repairs and one reconstruction  . TONSILLECTOMY    . TOTAL HIP ARTHROPLASTY Right   . VITRECTOMY      There were no vitals filed for this visit.  Subjective Assessment - 04/27/19 1611    Subjective   S: I'm tired.    Currently in Pain?  No/denies         [facility] OT Assessment - 04/27/19 1612      Assessment   Medical Diagnosis  S/P Left Reverse Total Shoulder Replacement      Precautions   Precautions  Shoulder    Type of Shoulder Precautions  follow protocol:  through 03/23/19 passive range to external rotaiton 30 degrees, abduction 60 degrees, flexion 90 degrees.  may begin aa/arom within same restrictions, 7/29-8/26 progress p/rom to external rotation 45, abduction 90, flexion 115, 8/26 and beyond push range to tolerance               OT Treatments/Exercises (OP) - 04/27/19 1612      Exercises   Exercises  Shoulder      Shoulder  Exercises: Supine   Protraction  PROM;5 reps;AROM;12 reps    Horizontal ABduction  PROM;5 reps;AROM;12 reps    External Rotation  PROM;5 reps;AROM;12 reps   abducted   Internal Rotation  PROM;5 reps;AROM;12 reps   abducted   Flexion  PROM;5 reps;AROM;12 reps    ABduction  PROM;5 reps;AROM;12 reps      Shoulder Exercises: Standing   Protraction  AROM;12 reps    Horizontal ABduction  AROM;12 reps    External Rotation  AROM;12 reps    Internal Rotation  AROM;12 reps    Flexion  AROM;12 reps    ABduction  AROM;12 reps      Shoulder Exercises: ROM/Strengthening   UBE (Upper Arm Bike)  Level 2 2' forward 2' reverse    pace: 8.5   Proximal Shoulder Strengthening, Seated  20X A/ROM no rest breaks    Ball on Wall  1' flexion 1' abduction    Other ROM/Strengthening Exercises  Pt passing small pink ball behind back working on IR,  15X Then passed behind head 12X               OT Short Term Goals - 04/27/19 1644      OT SHORT TERM GOAL #1   Title  Pt will understand and be independent with the HEP provided to facilitate his progress with ROM and strength.    Time  4    Period  Weeks    Target Date  04/14/19      OT SHORT TERM GOAL #2   Title  Patient will improve left shoulder P/ROM to [facility] in order to don and doff shirts with increased ease.    Time  4    Period  Weeks      OT SHORT TERM GOAL #3   Title  Patient will improve left shoulder strength to 3+/5 or better for improved ability to complete household tasks.    Time  4    Period  Weeks      OT SHORT TERM GOAL #4   Title  Patient will decrease pain in his left shoulder to 3/10 or better during adl completion.    Time  4    Period  Weeks      OT SHORT TERM GOAL #5   Title  Patient will decrease fascial restrictions to min- moderate in his left shoulder region for greater mobility needed for ADL completion.    Time  4    Period  Weeks        OT Long Term Goals - 04/27/19 1644      OT LONG TERM GOAL #1   Title  Pt will return to prior level of independence with all daily and work activities using his left arm actively.    Time  8    Period  Weeks    Status  On-going      OT LONG TERM GOAL #2   Title  Pt will have decreased fascial restrictions to trace in his left shoulder region to relieve tightness and tenderness to facilitate better ROM.    Time  8    Period  Weeks      OT LONG TERM GOAL #4   Title  Pt will have strength of 4+/5 or better in his left arm for ADL tasks such as yard work and heavy household chores.    Time  8    Period  Weeks    Status  On-going  OT LONG TERM GOAL #5   Title  Patients A/ROM of  left arm will be within normal limits to increase his ability to complete all overhead reaching activities.    Time  8    Period  Weeks    Status  On-going            Plan - 04/27/19 1642    Clinical  Impression Statement  A: Patient demonstrated increased difficulty with external rotation with ball pass. Did complete IR/er supine and standing with shoulder abducted with greater mobility although continues to be limiting.    Body Structure / Function / Physical Skills  ADL;Strength;Pain;UE functional use;ROM;IADL;Fascial restriction;Scar mobility;Muscle spasms    Plan  P: Continue with shoulder stretches and add to HEP.    Consulted and Agree with Plan of Care  Patient       Patient will benefit from skilled therapeutic intervention in order to improve the following deficits and impairments:   Body Structure / Function / Physical Skills: ADL, Strength, Pain, UE functional use, ROM, IADL, Fascial restriction, Scar mobility, Muscle spasms       Visit Diagnosis: Other symptoms and signs involving the musculoskeletal system  Stiffness of left shoulder, not elsewhere classified  Acute pain of left shoulder    Problem List Patient Active Problem List   Diagnosis Date Noted  . Rotator cuff arthropathy of left shoulder 02/23/2019  . 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 Hip pain, acute, right 11/17/2017  . Special screening for malignant neoplasms, colon    Ailene Ravel, OTR/L,CBIS  [phone]  04/27/2019, 4:45 PM  [place] [address] [place], [place], [address] Phone: [phone]   Fax:  [phone]  Name: EMMILIANO LOUNSBERRY MRN: [id number] Date of Birth: [birth year]

## 2019-05-03 ENCOUNTER — Encounter (HOSPITAL_COMMUNITY): Payer: No Typology Code available for payment source

## 2019-05-04 ENCOUNTER — Encounter (HOSPITAL_COMMUNITY): Payer: Self-pay | Admitting: Occupational Therapy

## 2019-05-04 ENCOUNTER — Ambulatory Visit (HOSPITAL_COMMUNITY): Payer: No Typology Code available for payment source | Admitting: Occupational Therapy

## 2019-05-04 ENCOUNTER — Other Ambulatory Visit: Payer: Self-pay

## 2019-05-04 DIAGNOSIS — M25612 Stiffness of left shoulder, not elsewhere classified: Secondary | ICD-10-CM

## 2019-05-04 DIAGNOSIS — R29898 Other symptoms and signs involving the musculoskeletal system: Secondary | ICD-10-CM | POA: Diagnosis not present

## 2019-05-04 DIAGNOSIS — M25512 Pain in left shoulder: Secondary | ICD-10-CM

## 2019-05-04 NOTE — Therapy (Signed)
[place], [place], [address] Phone: [phone]   Fax:  [phone]  Occupational Therapy Treatment  Patient Details  Name: Jacob Arellano MRN: [id number] Date of Birth: 11-30-1955 Referring Provider (OT): Dr. Victorino December   Encounter Date: 05/04/2019  OT End of Session - 05/04/19 1600    Visit Number  12    Number of Visits  16    Date for OT Re-Evaluation  05/14/19    Authorization Type  [employer]    Authorization Time Period  30 visits OT/PT combined, able to request additional visits if medically necessary    Authorization - Visit Number  12    Authorization - Number of Visits  30    OT Start Time  [id number]    OT Stop Time  1557    OT Time Calculation (min)  40 min    Activity Tolerance  Patient tolerated treatment well    Behavior During Therapy  [facility] for tasks assessed/performed       Past Medical History:  Diagnosis Date  . Depression   . Hypercholesteremia   . Hypertension   . Rotator cuff arthropathy of left shoulder 02/23/2019  . Sleep apnea    no CPAP, mild diagnosis no CPAP needed    Past Surgical History:  Procedure Laterality Date  . BACK SURGERY     a couple  . broken nose    . CATARACT EXTRACTION W/PHACO Right 11/01/2012   Procedure: CATARACT EXTRACTION PHACO AND INTRAOCULAR LENS PLACEMENT (IOC);  Surgeon: Tonny Branch, MD;  [facility];  Service: Ophthalmology;  Laterality: Right;  CDE=7.50  . CATARACT EXTRACTION W/PHACO Left 11/15/2012   Procedure: CATARACT EXTRACTION PHACO AND INTRAOCULAR LENS PLACEMENT (IOC);  Surgeon: Tonny Branch, MD;  [facility]: [facility];  Service: Ophthalmology;  Laterality: Left;  CDE 6.68  . COLONOSCOPY N/A 10/31/2016   Procedure: COLONOSCOPY;  Surgeon: Danie Binder, MD;  [facility];  Service: Endoscopy;  Laterality: N/A;  11:45 pm  . GANGLION CYST EXCISION     Bilateral hands  . left bicep repair    . NASAL SEPTUM SURGERY    . REVERSE SHOULDER  ARTHROPLASTY Left 02/23/2019   Procedure: REVERSE SHOULDER ARTHROPLASTY;  Surgeon: Nicholes Stairs, MD;  Location: [place];  Service: Orthopedics;  Laterality: Left;  114mins  . ROTATOR CUFF REPAIR     right 2 repairs and one reconstruction  . TONSILLECTOMY    . TOTAL HIP ARTHROPLASTY Right   . VITRECTOMY      There were no vitals filed for this visit.  Subjective Assessment - 05/04/19 1517    Subjective   S: I exercise my arm but not necessarily with the exercises from here.    Currently in Pain?  No/denies         [facility] OT Assessment - 05/04/19 1517      Assessment   Medical Diagnosis  S/P Left Reverse Total Shoulder Replacement      Precautions   Precautions  Shoulder    Type of Shoulder Precautions  follow protocol:  through 03/23/19 passive range to external rotaiton 30 degrees, abduction 60 degrees, flexion 90 degrees.  may begin aa/arom within same restrictions, 7/29-8/26 progress p/rom to external rotation 45, abduction 90, flexion 115, 8/26 and beyond push range to tolerance               OT Treatments/Exercises (OP) - 05/04/19 1520      Exercises  Exercises  Shoulder      Shoulder Exercises: Supine   Protraction  PROM;5 reps;AROM;15 reps    Horizontal ABduction  PROM;5 reps;AROM;15 reps    External Rotation  PROM;5 reps;AROM;15 reps   abducted   Internal Rotation  PROM;5 reps;AROM;15 reps   abducted   Flexion  PROM;5 reps;AROM;15 reps    ABduction  PROM;5 reps;AROM;15 reps      Shoulder Exercises: Sidelying   External Rotation  AROM;12 reps    Internal Rotation  AROM;12 reps    Flexion  AROM;12 reps    ABduction  AROM;12 reps    Other Sidelying Exercises  protraction, A/ROM, 12X     Other Sidelying Exercises  horizontal abduction, A/ROM, 12X      Shoulder Exercises: Standing   Horizontal ABduction  AROM;12 reps    Flexion  AROM;12 reps    ABduction  AROM;12 reps    Extension  Theraband;10 reps    Theraband Level (Shoulder Extension)  Level  3 (Green)    Row  [employer] reps    Theraband Level (Shoulder Row)  Level 3 (Green)    Retraction  Theraband;10 reps    Theraband Level (Shoulder Retraction)  Level 3 (Green)      Shoulder Exercises: ROM/Strengthening   Over Head Lace  2' seated    X to V Arms  10X    Proximal Shoulder Strengthening, Supine  15X A/ROM, no rest breaks    Ball on Wall  1' flexion 1' abduction    Other ROM/Strengthening Exercises  Pt passing small pink ball behind back working on IR, 15X Then passed behind head 15X    Other ROM/Strengthening Exercises  red loop band: lateral wall slides, diagonal wall slides, 10X      Shoulder Exercises: Stretch   Internal Rotation Stretch  3 reps   20" horizontal towel   External Rotation Stretch  3 reps;20 seconds    Wall Stretch - Flexion  3 reps;20 seconds    Wall Stretch - ABduction  3 reps;20 seconds             OT Education - 05/04/19 1543    Education Details  shoulder stretches    Person(s) Educated  Patient    Methods  Explanation;Demonstration;Handout    Comprehension  Verbalized understanding;Returned demonstration       OT Short Term Goals - 04/27/19 1644      OT SHORT TERM GOAL #1   Title  Pt will understand and be independent with the HEP provided to facilitate his progress with ROM and strength.    Time  4    Period  Weeks    Target Date  04/14/19      OT SHORT TERM GOAL #2   Title  Patient will improve left shoulder P/ROM to [facility] in order to don and doff shirts with increased ease.    Time  4    Period  Weeks      OT SHORT TERM GOAL #3   Title  Patient will improve left shoulder strength to 3+/5 or better for improved ability to complete household tasks.    Time  4    Period  Weeks      OT SHORT TERM GOAL #4   Title  Patient will decrease pain in his left shoulder to 3/10 or better during adl completion.    Time  4    Period  Weeks      OT SHORT TERM GOAL #5   Title  Patient will decrease fascial restrictions to min-  moderate in his left shoulder region for greater mobility needed for ADL completion.    Time  4    Period  Weeks        OT Long Term Goals - 04/27/19 1644      OT LONG TERM GOAL #1   Title  Pt will return to prior level of independence with all daily and work activities using his left arm actively.    Time  8    Period  Weeks    Status  On-going      OT LONG TERM GOAL #2   Title  Pt will have decreased fascial restrictions to trace in his left shoulder region to relieve tightness and tenderness to facilitate better ROM.    Time  8    Period  Weeks      OT LONG TERM GOAL #4   Title  Pt will have strength of 4+/5 or better in his left arm for ADL tasks such as yard work and heavy household chores.    Time  8    Period  Weeks    Status  On-going      OT LONG TERM GOAL #5   Title  Patients A/ROM of  left arm will be within normal limits to increase his ability to complete all overhead reaching activities.    Time  8    Period  Weeks    Status  On-going            Plan - 05/04/19 1601    Clinical Impression Statement  A: Pt with improvement in er and IR during ball pass activities. Added sidelying A/ROM, x to v arms, and red loop band exercises for shoulder stability. Progress to green scapular theraband today. Reviewed shoulder stretches and added to HEP. Verbal cuing for form and technique.    Body Structure / Function / Physical Skills  ADL;Strength;Pain;UE functional use;ROM;IADL;Fascial restriction;Scar mobility;Muscle spasms    Plan  P: Follow up on shoulder stretching HEP. Attempt therapy ball strengthening-chest press, flexion, and circles.       Patient will benefit from skilled therapeutic intervention in order to improve the following deficits and impairments:   Body Structure / Function / Physical Skills: ADL, Strength, Pain, UE functional use, ROM, IADL, Fascial restriction, Scar mobility, Muscle spasms       Visit Diagnosis: Other symptoms and signs  involving the musculoskeletal system  Stiffness of left shoulder, not elsewhere classified  Acute pain of left shoulder    Problem List Patient Active Problem List   Diagnosis Date Noted  . Rotator cuff arthropathy of left shoulder 02/23/2019  . 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 Hip pain, acute, right 11/17/2017  . Special screening for malignant neoplasms, colon    Guadelupe Sabin, OTR/L  [phone] 05/04/2019, 4:04 PM  [place] [address] [place], [place], [address] Phone: [phone]   Fax:  [phone]  Name: BRADY MUSSEN MRN: [id number] Date of Birth: October 26, 1955

## 2019-05-04 NOTE — Patient Instructions (Signed)

## 2019-05-05 ENCOUNTER — Ambulatory Visit (HOSPITAL_COMMUNITY): Payer: No Typology Code available for payment source

## 2019-05-05 ENCOUNTER — Encounter (HOSPITAL_COMMUNITY): Payer: Self-pay

## 2019-05-05 ENCOUNTER — Other Ambulatory Visit: Payer: Self-pay

## 2019-05-05 DIAGNOSIS — R29898 Other symptoms and signs involving the musculoskeletal system: Secondary | ICD-10-CM

## 2019-05-05 DIAGNOSIS — M25512 Pain in left shoulder: Secondary | ICD-10-CM

## 2019-05-05 DIAGNOSIS — M25612 Stiffness of left shoulder, not elsewhere classified: Secondary | ICD-10-CM

## 2019-05-06 NOTE — Therapy (Signed)
[place], [place], [address] Phone: [phone]   Fax:  [phone]  Occupational Therapy Treatment  Patient Details  Name: Jacob Arellano MRN: [id number] Date of Birth: 09-29-1955 Referring Provider (OT): Dr. Victorino December   Encounter Date: 05/05/2019  OT End of Session - 05/06/19 0833    Visit Number  13    Number of Visits  16    Date for OT Re-Evaluation  05/14/19    Authorization Type  Med[employer]    Authorization Time Period  30 visits OT/PT combined, able to request additional visits if medically necessary    Authorization - Visit Number  13    Authorization - Number of Visits  30    OT Start Time  [id number]   pt arrived late   OT Stop Time  1723    OT Time Calculation (min)  33 min    Activity Tolerance  Patient tolerated treatment well    Behavior During Therapy  [facility] for tasks assessed/performed       Past Medical History:  Diagnosis Date  . Depression   . Hypercholesteremia   . Hypertension   . Rotator cuff arthropathy of left shoulder 02/23/2019  . Sleep apnea    no CPAP, mild diagnosis no CPAP needed    Past Surgical History:  Procedure Laterality Date  . BACK SURGERY     a couple  . broken nose    . CATARACT EXTRACTION W/PHACO Right 11/01/2012   Procedure: CATARACT EXTRACTION PHACO AND INTRAOCULAR LENS PLACEMENT (IOC);  Surgeon: Tonny Branch, MD;  [facility];  Service: Ophthalmology;  Laterality: Right;  CDE=7.50  . CATARACT EXTRACTION W/PHACO Left 11/15/2012   Procedure: CATARACT EXTRACTION PHACO AND INTRAOCULAR LENS PLACEMENT (IOC);  Surgeon: Tonny Branch, MD;  [facility];  Service: Ophthalmology;  Laterality: Left;  CDE 6.68  . COLONOSCOPY N/A 10/31/2016   Procedure: COLONOSCOPY;  Surgeon: Danie Binder, MD;  [facility];  Service: Endoscopy;  Laterality: N/A;  11:45 pm  . GANGLION CYST EXCISION     Bilateral hands  . left bicep repair    . NASAL SEPTUM SURGERY    .  REVERSE SHOULDER ARTHROPLASTY Left 02/23/2019   Procedure: REVERSE SHOULDER ARTHROPLASTY;  Surgeon: Nicholes Stairs, MD;  Location: [place];  Service: Orthopedics;  Laterality: Left;  150mins  . ROTATOR CUFF REPAIR     right 2 repairs and one reconstruction  . TONSILLECTOMY    . TOTAL HIP ARTHROPLASTY Right   . VITRECTOMY      There were no vitals filed for this visit.  Subjective Assessment - 05/05/19 1703    Subjective   S: My knees are hurting a lot today so I may not be able to stand much.    Currently in Pain?  No/denies         [facility] OT Assessment - 05/05/19 1703      Assessment   Medical Diagnosis  S/P Left Reverse Total Shoulder Replacement      Precautions   Precautions  Shoulder    Type of Shoulder Precautions  progress as tolerate               OT Treatments/Exercises (OP) - 05/05/19 1704      Exercises   Exercises  Shoulder      Shoulder Exercises: Supine   Protraction  PROM;5 reps    Horizontal ABduction  PROM;5 reps    External Rotation  PROM;5 reps    Internal Rotation  PROM;5 reps    Flexion  PROM;5 reps    ABduction  PROM;5 reps      Shoulder Exercises: Seated   Other Seated Exercises  Green therapy ball: circkes, flexion, chest press 12X      Shoulder Exercises: Sidelying   External Rotation  AROM;15 reps    Internal Rotation  AROM;15 reps    Flexion  AROM;15 reps    ABduction  AROM;15 reps    Other Sidelying Exercises  protraction, A/ROM, 15X     Other Sidelying Exercises  horizontal abduction, A/ROM, 15X      Shoulder Exercises: ROM/Strengthening   X to V Arms  10X    Ball on Wall  1' flexion 1' abduction               OT Short Term Goals - 04/27/19 1644      OT SHORT TERM GOAL #1   Title  Pt will understand and be independent with the HEP provided to facilitate his progress with ROM and strength.    Time  4    Period  Weeks    Target Date  04/14/19      OT SHORT TERM GOAL #2   Title  Patient will improve left  shoulder P/ROM to [facility] in order to don and doff shirts with increased ease.    Time  4    Period  Weeks      OT SHORT TERM GOAL #3   Title  Patient will improve left shoulder strength to 3+/5 or better for improved ability to complete household tasks.    Time  4    Period  Weeks      OT SHORT TERM GOAL #4   Title  Patient will decrease pain in his left shoulder to 3/10 or better during adl completion.    Time  4    Period  Weeks      OT SHORT TERM GOAL #5   Title  Patient will decrease fascial restrictions to min- moderate in his left shoulder region for greater mobility needed for ADL completion.    Time  4    Period  Weeks        OT Long Term Goals - 04/27/19 1644      OT LONG TERM GOAL #1   Title  Pt will return to prior level of independence with all daily and work activities using his left arm actively.    Time  8    Period  Weeks    Status  On-going      OT LONG TERM GOAL #2   Title  Pt will have decreased fascial restrictions to trace in his left shoulder region to relieve tightness and tenderness to facilitate better ROM.    Time  8    Period  Weeks      OT LONG TERM GOAL #4   Title  Pt will have strength of 4+/5 or better in his left arm for ADL tasks such as yard work and heavy household chores.    Time  8    Period  Weeks    Status  On-going      OT LONG TERM GOAL #5   Title  Patients A/ROM of  left arm will be within normal limits to increase his ability to complete all overhead reaching activities.    Time  8    Period  Weeks    Status  On-going  Plan - 05/06/19 0834    Clinical Impression Statement  A: Pt completed therapy ball shoulder strengthening exercises with moderate difficulty due to right arm limitations. Focused on shoulder and scapular stability throughout session with VC provided for form and technique.    Body Structure / Function / Physical Skills  ADL;Strength;Pain;UE functional use;ROM;IADL;Fascial restriction;Scar  mobility;Muscle spasms    Plan  P: Reassess and determine if ready for discharge. Should be able to progress to strengthening with light weight and resistance band at this session.    Consulted and Agree with Plan of Care  Patient       Patient will benefit from skilled therapeutic intervention in order to improve the following deficits and impairments:   Body Structure / Function / Physical Skills: ADL, Strength, Pain, UE functional use, ROM, IADL, Fascial restriction, Scar mobility, Muscle spasms       Visit Diagnosis: Other symptoms and signs involving the musculoskeletal system  Acute pain of left shoulder  Stiffness of left shoulder, not elsewhere classified    Problem List Patient Active Problem List   Diagnosis Date Noted  . Rotator cuff arthropathy of left shoulder 02/23/2019  . 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 Hip pain, acute, right 11/17/2017  . Special screening for malignant neoplasms, colon    Ailene Ravel, OTR/L,CBIS  [phone]  05/06/2019, 8:36 AM  [place] [place] [address] [place], [place], [address] Phone: [phone]   Fax:  [phone]  Name: Jacob Arellano MRN: [id number] Date of Birth: 09-11-1955

## 2019-05-09 ENCOUNTER — Encounter (HOSPITAL_COMMUNITY): Payer: No Typology Code available for payment source

## 2019-05-10 ENCOUNTER — Encounter (HOSPITAL_COMMUNITY): Payer: No Typology Code available for payment source | Admitting: Occupational Therapy

## 2019-05-11 ENCOUNTER — Encounter (HOSPITAL_COMMUNITY): Payer: No Typology Code available for payment source

## 2019-05-12 ENCOUNTER — Encounter (HOSPITAL_COMMUNITY): Payer: No Typology Code available for payment source | Admitting: Occupational Therapy

## 2019-05-17 ENCOUNTER — Ambulatory Visit (HOSPITAL_COMMUNITY): Payer: No Typology Code available for payment source

## 2019-06-20 ENCOUNTER — Observation Stay (HOSPITAL_COMMUNITY)
Admission: RE | Admit: 2019-06-20 | Discharge: 2019-06-20 | Disposition: A | Discharge status: Home / Self Care | Payer: PRIVATE HEALTH INSURANCE | Source: Ambulatory Visit | Attending: Orthopedic Surgery | Admitting: Orthopedic Surgery

## 2019-06-20 DIAGNOSIS — Z20828 Contact with and (suspected) exposure to other viral communicable diseases: Secondary | ICD-10-CM | POA: Diagnosis not present

## 2019-06-20 DIAGNOSIS — Z01812 Encounter for preprocedural laboratory examination: Secondary | ICD-10-CM | POA: Insufficient documentation

## 2019-06-22 ENCOUNTER — Encounter (HOSPITAL_BASED_OUTPATIENT_CLINIC_OR_DEPARTMENT_OTHER): Payer: Self-pay | Admitting: *Deleted

## 2019-06-22 ENCOUNTER — Other Ambulatory Visit: Payer: Self-pay

## 2019-06-22 LAB — SARS CORONAVIRUS 2 BY RT PCR (HOSPITAL ORDER, PERFORMED IN ~~LOC~~ HOSPITAL LAB): SARS Coronavirus 2: NEGATIVE

## 2019-06-22 NOTE — Progress Notes (Addendum)
Spoke w/ via phone for pre-op interview--- PT Lab needs dos---- Istat 8               Lab results----- Current EKG in chart/ epic COVID test ------ 06-20-2019 Arrive at ------- 0730 NPO after ------ MN Medications to take morning of surgery ----- Lamictal Diabetic medication ----- n/a Patient Special Instructions ----- n/a Pre-Op special Istructions ----- per pt was not told to stop ASA 81mg  prior to surgery by office,  Last dose 06-21-2019  Patient verbalized understanding of instructions that were given at this phone interview. Patient denies shortness of breath, chest pain, fever, cough a this phone interview.

## 2019-06-23 ENCOUNTER — Encounter (HOSPITAL_BASED_OUTPATIENT_CLINIC_OR_DEPARTMENT_OTHER)
Admission: RE | Disposition: A | Discharge status: Home / Self Care | Payer: Self-pay | Source: Home / Self Care | Attending: Orthopedic Surgery

## 2019-06-23 ENCOUNTER — Ambulatory Visit (HOSPITAL_BASED_OUTPATIENT_CLINIC_OR_DEPARTMENT_OTHER): Payer: No Typology Code available for payment source | Admitting: Anesthesiology

## 2019-06-23 ENCOUNTER — Encounter (HOSPITAL_BASED_OUTPATIENT_CLINIC_OR_DEPARTMENT_OTHER): Payer: Self-pay | Admitting: Emergency Medicine

## 2019-06-23 ENCOUNTER — Other Ambulatory Visit: Payer: Self-pay

## 2019-06-23 ENCOUNTER — Ambulatory Visit (HOSPITAL_BASED_OUTPATIENT_CLINIC_OR_DEPARTMENT_OTHER)
Admission: RE | Admit: 2019-06-23 | Discharge: 2019-06-23 | Disposition: A | Discharge status: Home / Self Care | Payer: No Typology Code available for payment source | Attending: Orthopedic Surgery | Admitting: Orthopedic Surgery

## 2019-06-23 ENCOUNTER — Ambulatory Visit (HOSPITAL_COMMUNITY): Payer: No Typology Code available for payment source

## 2019-06-23 DIAGNOSIS — X58XXXA Exposure to other specified factors, initial encounter: Secondary | ICD-10-CM | POA: Diagnosis not present

## 2019-06-23 DIAGNOSIS — I10 Essential (primary) hypertension: Secondary | ICD-10-CM | POA: Diagnosis not present

## 2019-06-23 DIAGNOSIS — M94262 Chondromalacia, left knee: Secondary | ICD-10-CM | POA: Diagnosis not present

## 2019-06-23 DIAGNOSIS — Z419 Encounter for procedure for purposes other than remedying health state, unspecified: Secondary | ICD-10-CM

## 2019-06-23 DIAGNOSIS — M84452A Pathological fracture, left femur, initial encounter for fracture: Secondary | ICD-10-CM | POA: Diagnosis not present

## 2019-06-23 DIAGNOSIS — Z96612 Presence of left artificial shoulder joint: Secondary | ICD-10-CM | POA: Insufficient documentation

## 2019-06-23 DIAGNOSIS — M1990 Unspecified osteoarthritis, unspecified site: Secondary | ICD-10-CM | POA: Diagnosis not present

## 2019-06-23 DIAGNOSIS — S83232A Complex tear of medial meniscus, current injury, left knee, initial encounter: Secondary | ICD-10-CM | POA: Diagnosis not present

## 2019-06-23 DIAGNOSIS — G4730 Sleep apnea, unspecified: Secondary | ICD-10-CM | POA: Insufficient documentation

## 2019-06-23 DIAGNOSIS — Z79899 Other long term (current) drug therapy: Secondary | ICD-10-CM | POA: Insufficient documentation

## 2019-06-23 DIAGNOSIS — Z96641 Presence of right artificial hip joint: Secondary | ICD-10-CM | POA: Diagnosis not present

## 2019-06-23 DIAGNOSIS — S83242A Other tear of medial meniscus, current injury, left knee, initial encounter: Secondary | ICD-10-CM | POA: Diagnosis present

## 2019-06-23 DIAGNOSIS — Z7982 Long term (current) use of aspirin: Secondary | ICD-10-CM | POA: Insufficient documentation

## 2019-06-23 HISTORY — PX: KNEE ARTHROSCOPY WITH MENISCAL REPAIR: SHX5653

## 2019-06-23 HISTORY — DX: Attention-deficit hyperactivity disorder, unspecified type: F90.9

## 2019-06-23 HISTORY — DX: Unspecified tear of unspecified meniscus, current injury, left knee, initial encounter: S83.207A

## 2019-06-23 HISTORY — DX: Unspecified osteoarthritis, unspecified site: M19.90

## 2019-06-23 HISTORY — DX: Obstructive sleep apnea (adult) (pediatric): G47.33

## 2019-06-23 HISTORY — DX: Hyperlipidemia, unspecified: E78.5

## 2019-06-23 LAB — POCT I-STAT, CHEM 8
BUN: 24 mg/dL — ABNORMAL HIGH (ref 8–23)
Calcium, Ion: 1.26 mmol/L (ref 1.15–1.40)
Chloride: 107 mmol/L (ref 98–111)
Creatinine, Ser: 1 mg/dL (ref 0.61–1.24)
Glucose, Bld: 112 mg/dL — ABNORMAL HIGH (ref 70–99)
HCT: 37 % — ABNORMAL LOW (ref 39.0–52.0)
Hemoglobin: 12.6 g/dL — ABNORMAL LOW (ref 13.0–17.0)
Potassium: 4.2 mmol/L (ref 3.5–5.1)
Sodium: 141 mmol/L (ref 135–145)
TCO2: 23 mmol/L (ref 22–32)

## 2019-06-23 SURGERY — ARTHROSCOPY, KNEE, WITH MENISCUS REPAIR
Anesthesia: General | Site: Knee | Laterality: Left

## 2019-06-23 MED ORDER — PROPOFOL 10 MG/ML IV BOLUS
INTRAVENOUS | Status: DC | PRN
  Administered 2019-06-23: 200 mg via INTRAVENOUS

## 2019-06-23 MED ORDER — LIDOCAINE 2% (20 MG/ML) 5 ML SYRINGE
INTRAMUSCULAR | Status: AC
  Filled 2019-06-23: qty 5

## 2019-06-23 MED ORDER — CEFAZOLIN SODIUM-DEXTROSE 2-4 GM/100ML-% IV SOLN
INTRAVENOUS | Status: AC
  Filled 2019-06-23: qty 100

## 2019-06-23 MED ORDER — SODIUM CHLORIDE 0.9 % IR SOLN
Status: DC | PRN
  Administered 2019-06-23: 6000 mL

## 2019-06-23 MED ORDER — LACTATED RINGERS IV SOLN
INTRAVENOUS | Status: DC
  Administered 2019-06-23 (×2): via INTRAVENOUS
  Filled 2019-06-23: qty 1000

## 2019-06-23 MED ORDER — MIDAZOLAM HCL 2 MG/2ML IJ SOLN
INTRAMUSCULAR | Status: AC
  Filled 2019-06-23: qty 2

## 2019-06-23 MED ORDER — OXYCODONE HCL 5 MG PO TABS: 5.0000 mg | ORAL_TABLET | ORAL | 0 refills | Status: DC | PRN

## 2019-06-23 MED ORDER — CEFAZOLIN SODIUM-DEXTROSE 2-4 GM/100ML-% IV SOLN
2.0000 g | INTRAVENOUS | Status: AC
  Administered 2019-06-23: 2 g via INTRAVENOUS
  Filled 2019-06-23: qty 100

## 2019-06-23 MED ORDER — OXYCODONE HCL 5 MG PO TABS
ORAL_TABLET | ORAL | Status: AC
  Filled 2019-06-23: qty 1

## 2019-06-23 MED ORDER — PROPOFOL 500 MG/50ML IV EMUL
INTRAVENOUS | Status: AC
  Filled 2019-06-23: qty 50

## 2019-06-23 MED ORDER — HYDROMORPHONE HCL 1 MG/ML IJ SOLN
INTRAMUSCULAR | Status: AC
  Filled 2019-06-23: qty 1

## 2019-06-23 MED ORDER — FENTANYL CITRATE (PF) 100 MCG/2ML IJ SOLN
INTRAMUSCULAR | Status: AC
  Filled 2019-06-23: qty 2

## 2019-06-23 MED ORDER — ONDANSETRON HCL 4 MG/2ML IJ SOLN
INTRAMUSCULAR | Status: DC | PRN
  Administered 2019-06-23: 4 mg via INTRAVENOUS

## 2019-06-23 MED ORDER — FENTANYL CITRATE (PF) 100 MCG/2ML IJ SOLN
INTRAMUSCULAR | Status: DC | PRN
  Administered 2019-06-23: 25 ug via INTRAVENOUS
  Administered 2019-06-23: 50 ug via INTRAVENOUS
  Administered 2019-06-23: 25 ug via INTRAVENOUS

## 2019-06-23 MED ORDER — LIDOCAINE HCL (CARDIAC) PF 100 MG/5ML IV SOSY
PREFILLED_SYRINGE | INTRAVENOUS | Status: DC | PRN
  Administered 2019-06-23: 80 mg via INTRAVENOUS

## 2019-06-23 MED ORDER — DEXAMETHASONE SODIUM PHOSPHATE 10 MG/ML IJ SOLN
INTRAMUSCULAR | Status: AC
  Filled 2019-06-23: qty 1

## 2019-06-23 MED ORDER — ONDANSETRON 4 MG PO TBDP: 4.0000 mg | ORAL_TABLET | Freq: Three times a day (TID) | ORAL | 0 refills | Status: DC | PRN

## 2019-06-23 MED ORDER — ONDANSETRON HCL 4 MG/2ML IJ SOLN
INTRAMUSCULAR | Status: AC
  Filled 2019-06-23: qty 2

## 2019-06-23 MED ORDER — DEXAMETHASONE SODIUM PHOSPHATE 4 MG/ML IJ SOLN
INTRAMUSCULAR | Status: DC | PRN
  Administered 2019-06-23: 10 mg via INTRAVENOUS

## 2019-06-23 MED ORDER — MIDAZOLAM HCL 5 MG/5ML IJ SOLN
INTRAMUSCULAR | Status: DC | PRN
  Administered 2019-06-23: 2 mg via INTRAVENOUS

## 2019-06-23 MED ORDER — BUPIVACAINE HCL 0.25 % IJ SOLN
INTRAMUSCULAR | Status: DC | PRN
  Administered 2019-06-23: 30 mL

## 2019-06-23 MED ORDER — CHLORHEXIDINE GLUCONATE 4 % EX LIQD
60.0000 mL | Freq: Once | CUTANEOUS | Status: DC
  Filled 2019-06-23: qty 118

## 2019-06-23 MED ORDER — OXYCODONE HCL 5 MG PO TABS
5.0000 mg | ORAL_TABLET | Freq: Once | ORAL | Status: AC
  Administered 2019-06-23: 5 mg via ORAL
  Filled 2019-06-23: qty 1

## 2019-06-23 MED ORDER — HYDROMORPHONE HCL 1 MG/ML IJ SOLN
0.2500 mg | INTRAMUSCULAR | Status: DC | PRN
  Administered 2019-06-23: 0.5 mg via INTRAVENOUS
  Filled 2019-06-23: qty 0.5

## 2019-06-23 SURGICAL SUPPLY — 41 items
BANDAGE ELASTIC 6 VELCRO ST LF (GAUZE/BANDAGES/DRESSINGS) ×2 IMPLANT
BANDAGE ESMARK 6X9 LF (GAUZE/BANDAGES/DRESSINGS) IMPLANT
BLADE CUTTER GATOR 3.5 (BLADE) IMPLANT
BLADE SHAVER TORPEDO 4X13 (MISCELLANEOUS) ×1 IMPLANT
BNDG CMPR 9X6 STRL LF SNTH (GAUZE/BANDAGES/DRESSINGS)
BNDG ELASTIC 6X5.8 VLCR STR LF (GAUZE/BANDAGES/DRESSINGS) ×1 IMPLANT
BNDG ESMARK 6X9 LF (GAUZE/BANDAGES/DRESSINGS)
BNDG GAUZE ELAST 4 BULKY (GAUZE/BANDAGES/DRESSINGS) ×1 IMPLANT
COVER WAND RF STERILE (DRAPES) ×2 IMPLANT
CUFF TOURN SGL QUICK 34 (TOURNIQUET CUFF) ×2
CUFF TRNQT CYL 34X4.125X (TOURNIQUET CUFF) ×1 IMPLANT
DRAPE ARTHROSCOPY W/POUCH 114 (DRAPES) ×2 IMPLANT
DRAPE C-ARM 42X120 X-RAY (DRAPES) ×1 IMPLANT
DRAPE U-SHAPE 47X51 STRL (DRAPES) ×2 IMPLANT
DRSG PAD ABDOMINAL 8X10 ST (GAUZE/BANDAGES/DRESSINGS) ×2 IMPLANT
DURAPREP 26ML APPLICATOR (WOUND CARE) ×2 IMPLANT
GAUZE SPONGE 4X4 12PLY STRL (GAUZE/BANDAGES/DRESSINGS) ×2 IMPLANT
GAUZE SPONGE 4X4 12PLY STRL LF (GAUZE/BANDAGES/DRESSINGS) ×1 IMPLANT
GAUZE XEROFORM 1X8 LF (GAUZE/BANDAGES/DRESSINGS) IMPLANT
GLOVE BIO SURGEON STRL SZ7.5 (GLOVE) ×2 IMPLANT
GLOVE BIOGEL PI IND STRL 8 (GLOVE) ×1 IMPLANT
GLOVE BIOGEL PI INDICATOR 8 (GLOVE) ×1
GOWN STRL REUS W/TWL XL LVL3 (GOWN DISPOSABLE) ×2 IMPLANT
GRAFT FILLER BONE 5ML (Knees) IMPLANT
IV NS IRRIG 3000ML ARTHROMATIC (IV SOLUTION) ×4 IMPLANT
KIT ACCUFILL 5CC (Knees) ×1 IMPLANT
KIT KNEE SCP 414.502 (Knees) ×2 IMPLANT
KIT TURNOVER CYSTO (KITS) ×2 IMPLANT
KNEE WRAP E Z 3 GEL PACK (MISCELLANEOUS) ×2 IMPLANT
MANIFOLD NEPTUNE II (INSTRUMENTS) ×2 IMPLANT
PACK ARTHROSCOPY DSU (CUSTOM PROCEDURE TRAY) ×2 IMPLANT
PACK BASIN DAY SURGERY FS (CUSTOM PROCEDURE TRAY) ×2 IMPLANT
PAD ABD 8X10 STRL (GAUZE/BANDAGES/DRESSINGS) ×1 IMPLANT
PAD ARMBOARD 7.5X6 YLW CONV (MISCELLANEOUS) IMPLANT
PROBE BIPOLAR 50 DEGREE SUCT (MISCELLANEOUS) IMPLANT
PROBE BIPOLAR ATHRO 135MM 90D (MISCELLANEOUS) IMPLANT
SUT ETHILON 3 0 PS 1 (SUTURE) ×1 IMPLANT
TOWEL OR 17X26 10 PK STRL BLUE (TOWEL DISPOSABLE) ×2 IMPLANT
TUBE CONNECTING 12X1/4 (SUCTIONS) ×4 IMPLANT
WAND 30 DEG SABER W/CORD (SURGICAL WAND) IMPLANT
WATER STERILE IRR 500ML POUR (IV SOLUTION) ×1 IMPLANT

## 2019-06-23 NOTE — Transfer of Care (Signed)
Immediate Anesthesia Transfer of Care Note  Patient: Jacob Arellano  Procedure(s) Performed: Procedure(s) (LRB): KNEE ARTHROSCOPY partial medial meniscectomy and medial femoral condyle subchondroplasty (Left)  Patient Location: PACU  Anesthesia Type: General  Level of Consciousness: awake, sedated, patient cooperative and responds to stimulation  Airway & Oxygen Therapy: Patient Spontanous Breathing and Patient connected to [place] 02  Post-op Assessment: Report given to PACU RN, Post -op Vital signs reviewed and stable and Patient moving all extremities  Post vital signs: Reviewed and stable  Complications: No apparent anesthesia complications

## 2019-06-23 NOTE — Anesthesia Procedure Notes (Signed)
Procedure Name: LMA Insertion Date/Time: 06/23/2019 9:33 AM Performed by: Justice Rocher, CRNA Pre-anesthesia Checklist: Patient identified, Emergency Drugs available, Suction available and Patient being monitored Patient Re-evaluated:Patient Re-evaluated prior to induction Oxygen Delivery Method: Circle system utilized Preoxygenation: Pre-oxygenation with 100% oxygen Induction Type: IV induction Ventilation: Mask ventilation without difficulty LMA: LMA inserted LMA Size: 5.0 Number of attempts: 1 Airway Equipment and Method: Bite block Placement Confirmation: positive ETCO2 and breath sounds checked- equal and bilateral Tube secured with: Tape Dental Injury: Teeth and Oropharynx as per pre-operative assessment

## 2019-06-23 NOTE — Op Note (Signed)
Surgeon(s): Nicholes Stairs, MD   ANESTHESIA:  general, and regional   FLUIDS: Per anesthesia record.    ESTIMATED BLOOD LOSS: minimal     PREOPERATIVE DIAGNOSES:  1. Left knee medial meniscus tear 2.  Left medial femoral condyle insufficiency fracture 3.   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 Left knee arthroscopically aided treatment of medial femoral condyle insufficiency fracture with percutaneous internal fixation (subchondroplasty) [id number]) 2. Left knee arthroscopy with arthroscopic partial medial meniscectomy 3.  Chondroplasty, Left medial and lateral femoral condyle and medial tibia plateau   Implant: Flowable calcium phosphate, 5 mL. Zimmer   DESCRIPTION OF PROCEDURE: The patient has a left knee medial meniscus tear. They have had pain that has been refractory to conservative management. Their preoperative MRI demonstrated subchondral bone marrow edema and insufficiency fractures of the left medial femoral condyle as well as the medial meniscus tear. Plans are to proceed with partial medial meniscectomy, internal fixation of subchondral insufficiency fractures with flowable calcium phosphate, and diagnostic arthroscopy with debridement as indicated. Full discussion held regarding risks benefits alternatives and complications related surgical intervention. Conservative care options reviewed. All questions answered.   The patient was identified in the preoperative holding area and the operative extremity was marked. The patient was brought to the operating room and transferred to operating table in a supine position. Satisfactory general anesthesia was induced by anesthesiology.     Standard anterolateral, anteromedial arthroscopy portals were obtained. The anteromedial portal was obtained with a spinal needle for localization under direct visualization with subsequent  diagnostic findings.    Anteromedial and anterolateral chambers: moderate synovitis. The synovitis was debrided with a 4.5 mm full radius shaver through both the anteromedial and lateral portals.    Suprapatellar pouch and gutters: mild synovitis or debris. Patella chondral surface: Grade 1 Trochlear chondral surface: Grade3 Patellofemoral tracking: level Medial meniscus: posterior horn, anterior horn and mid body complex degenerative tearing.  Medial femoral condyle flexion bearing surface: Grade 3 and IV Medial femoral condyle extension bearing surface: Grade 3 Medial tibial plateau: Grade 3 and 4 Anterior cruciate ligament:stable Posterior cruciate ligament:stable Lateral meniscus: no tear.   Lateral femoral condyle flexion bearing surface: Grade 1 Lateral femoral condyle extension bearing surface: Grade 3 Lateral tibial plateau: Grade 0   Medial meniscus tear was debrided using biters and motorized shaver alternating until a stable remnant was left. Upon completion the probe was used to evaluate and assess the remaining meniscus which was gleaned to be stable.   Chondroplasty was achieved on the medial and lateral femoral condyles using a motorized shaver to debride the grade 3 unstable cartilage. Completion of the chondroplasty left A medial and lateral femoral condyles with smooth stable surface.    Next we turned our attention to the internal fixation of the medial femoral condyle. Arthroscopically we evaluated the condyle noted there was no loose cartilage or debris surrounding the lesion and the fracture did not propagate to the joint surface. Using preoperative MRI we targeted the delivery device to just under the subchondral density and in the medial femoral condyle. This was achieved with intraoperative fluoroscopy. Once accurate placement was noted on 2 views and confirmed we delivered 3 mL of flowable calcium phosphate into the lesion. We left the cannulas in place for  approximately 8 minutes while the implant hardened. We removed the cannulas and again took 2 views of fluoroscopic pictures  to confirm there was no extravasation outside of the bone. There was none noted.   After completion of synovectomy, diagnostic exam, and debridements as described, all compartments were checked and no residual debris remained. Hemostasis was achieved with the cautery wand. The portals were approximated with nylon suture. All excess fluid was expressed from the joint.  Xeroform sterile gauze dressings were applied followed by Ace bandage and ice pack.    There were no immediate competitions and all counts were correct.   DISPOSITION: The patient was awakened from general anesthetic, extubated, taken to the recovery room in medically stable condition, no apparent complications. The patient may be weightbearing as tolerated to the operative lower extremity with crutches.  Range of motion of right knee as tolerated.  They will use bid asa for DVT ppx, and return in 2 weeks for suture removal.   Nicholes Stairs

## 2019-06-23 NOTE — Discharge Instructions (Signed)
-   ok for weight bearing as tolerated to the left leg.  Use crutches as needed for support  - maintain post op bandage to left leg for 3 days. Expect moderate drainage.  On the 3rd day you may remove and begin showering, but do not submerge wounds under water.  Cover with dry dressings  - for prevention of blood clots take a 81 mg asa once daily for 6 weeks  - for mild to moderate pain use ICE to the knee for 30 minutes per hour, take tylenol and advil around the clock and use oxycodone for breakthrough pain.  - return to see Dr. Stann Mainland in 2 weeks for post op   Call your surgeon if you experience:   1.  Fever over 101.0. 2.  Inability to urinate. 3.  Nausea and/or vomiting. 4.  Extreme swelling or bruising at the surgical site. 5.  Continued bleeding from the incision. 6.  Increased pain, redness or drainage from the incision. 7.  Problems related to your pain medication. 8.  Any problems and/or concerns 9. Any new numbness or tingling in left foot 10. Any changes in color, temperature, or circulation of left foot or leg.   Post Anesthesia Home Care Instructions  Activity: Get plenty of rest for the remainder of the day. A responsible individual must stay with you for 24 hours following the procedure.  For the next 24 hours, DO NOT: -Drive a car -Paediatric nurse -Drink alcoholic beverages -Take any medication unless instructed by your physician -Make any legal decisions or sign important papers.  Meals: Start with liquid foods such as gelatin or soup. Progress to regular foods as tolerated. Avoid greasy, spicy, heavy foods. If nausea and/or vomiting occur, drink only clear liquids until the nausea and/or vomiting subsides. Call your physician if vomiting continues.  Special Instructions/Symptoms: Your throat may feel dry or sore from the anesthesia or the breathing tube placed in your throat during surgery. If this causes discomfort, gargle with warm salt water. The discomfort  should disappear within 24 hours.

## 2019-06-23 NOTE — H&P (Signed)
ORTHOPAEDIC H and P  REQUESTING PHYSICIAN: Nicholes Stairs, MD  PCP:  Celene Squibb, MD  Chief Complaint: Left knee pain  HPI: Jacob Arellano is a 63 y.o. male who complains of increasing left knee pain despite appropriate conservative treatment.  He is here today for arthroscopic interventions.  No new complaints or changes to his history or otherwise.  Past Medical History:  Diagnosis Date  . Acute meniscal tear of left knee   . ADHD   . Depression   . Hyperlipidemia   . Hypertension    followed by pcp--- (06-22-2019  per pt had nuclear stress test approx. 2017 @ [facility],  told normal)  . Mild obstructive sleep apnea    per pt does not use cpap on regular basis only on travelling  . OA (osteoarthritis)    knees, hips, shoulders   Past Surgical History:  Procedure Laterality Date  . BICEPS TENDON REPAIR Left 2004  . CATARACT EXTRACTION W/PHACO Right 11/01/2012   Procedure: CATARACT EXTRACTION PHACO AND INTRAOCULAR LENS PLACEMENT (IOC);  Surgeon: Tonny Branch, MD;  Location: [facility];  Service: Ophthalmology;  Laterality: Right;  CDE=7.50  . CATARACT EXTRACTION W/PHACO Left 11/15/2012   Procedure: CATARACT EXTRACTION PHACO AND INTRAOCULAR LENS PLACEMENT (IOC);  Surgeon: Tonny Branch, MD;  Location: [facility];  Service: Ophthalmology;  Laterality: Left;  CDE 6.68  . COLONOSCOPY N/A 10/31/2016   Procedure: COLONOSCOPY;  Surgeon: Danie Binder, MD;  Location: [facility];  Service: Endoscopy;  Laterality: N/A;  11:45 pm  . GANGLION CYST EXCISION Bilateral 1975;  2016   wrist  . [place];  2018  . NASAL FRACTURE SURGERY  07-03-2008  @[facility]   . RETINAL DETACHMENT SURGERY Bilateral right x2 last one 2018;  left 2018  . REVERSE SHOULDER ARTHROPLASTY Left 02/23/2019   Procedure: REVERSE SHOULDER ARTHROPLASTY;  Surgeon: Nicholes Stairs, MD;  Location: [place];  Service: Orthopedics;  Laterality: Left;  1106mins  . ROTATOR CUFF REPAIR Right 1998;  2017  . SHOULDER  ARTHROSCOPY WITH ROTATOR CUFF REPAIR AND OPEN BICEPS TENODESIS Right 12-30-2016  @[facility]    and Capular reconstruction  . TONSILLECTOMY  child  . TOTAL HIP ARTHROPLASTY Right 01-05-2018   @[facility]   . VITRECTOMY Left 2017   Social History   Socioeconomic History  . Marital status: Divorced    Spouse name: Not on file  . Number of children: Not on file  . Years of education: Not on file  . Highest education level: Not on file  Occupational History  . Not on file  Social Needs  . Financial resource strain: Not on file  . Food insecurity    Worry: Not on file    Inability: Not on file  . Transportation needs    Medical: Not on file    Non-medical: Not on file  Tobacco Use  . Smoking status: Never Smoker  . Smokeless tobacco: Never Used  Substance and Sexual Activity  . Alcohol use: Yes    Comment: occasionally  . Drug use: Never  . Sexual activity: Not on file  Lifestyle  . Physical activity    Days per week: Not on file    Minutes per session: Not on file  . Stress: Not on file  Relationships  . Social Herbalist on phone: Not on file    Gets together: Not on file    Attends religious service: Not on file    Active member of  club or organization: Not on file    Attends meetings of clubs or organizations: Not on file    Relationship status: Not on file  Other Topics Concern  . Not on file  Social History Narrative  . Not on file   Family History  Problem Relation Age of Onset  . Colon cancer Neg Hx    No Known Allergies Prior to Admission medications   Medication Sig Start Date End Date Taking? Authorizing Provider  aspirin EC 81 MG tablet Take 81 mg by mouth every morning.    Yes [provider]  citalopram (CELEXA) 20 MG tablet Take 20 mg by mouth at bedtime.    Yes [provider]  fenofibrate (TRICOR) 48 MG tablet Take 48 mg by mouth at bedtime.    Yes [provider]  lamoTRIgine (LAMICTAL) 150 MG tablet Take 150 mg by  mouth 2 (two) times daily. 08/14/16  Yes [provider]  lisdexamfetamine (VYVANSE) 40 MG capsule Take 40 mg by mouth daily as needed (ADHD).    Yes [provider]  lisinopril-hydrochlorothiazide (PRINZIDE,ZESTORETIC) 10-12.5 MG per tablet Take 1 tablet by mouth every morning.    Yes [provider]  simvastatin (ZOCOR) 40 MG tablet Take 40 mg by mouth every evening.   Yes [provider]  ondansetron (ZOFRAN ODT) 4 MG disintegrating tablet Take 1 tablet (4 mg total) by mouth every 8 (eight) hours as needed. 02/24/19   Nicholes Stairs, MD   No results found.  Positive ROS: All other systems have been reviewed and were otherwise negative with the exception of those mentioned in the HPI and as above.  Physical Exam: General: Alert, no acute distress Cardiovascular: No pedal edema Respiratory: No cyanosis, no use of accessory musculature GI: No organomegaly, abdomen is soft and non-tender Skin: No lesions in the area of chief complaint Neurologic: Sensation intact distally Psychiatric: Patient is competent for consent with normal mood and affect Lymphatic: No axillary or cervical lymphadenopathy  MUSCULOSKELETAL:  LLE- skin is wwp without lesions or wounds  Assessment: 1. Left knee medial meniscus tear 2. Left knee medial femoral condyle stress fracture  Plan: - plan for arthroscopic partial medial meniscectomy of the left knee with [place] subcondroplasty today - The risks, benefits, and alternatives were discussed with the patient. There are risks associated with the surgery including, but not limited to, problems with anesthesia (death), infection,  fracture of bones, loosening or failure of implants, malunion, nonunion, hematoma (blood accumulation) which may require surgical drainage, blood clots, pulmonary embolism, nerve injury (foot drop), and blood vessel injury. The patient understands these risks and elects to proceed.   - DC post op from  [place], MD Cell [phone]    06/23/2019 9:02 AM

## 2019-06-23 NOTE — Anesthesia Preprocedure Evaluation (Addendum)
Anesthesia Evaluation  Patient identified by MRN, date of birth, ID band Patient awake    Reviewed: Allergy & Precautions, Patient's Chart, lab work & pertinent test results  Airway Mallampati: II  TM Distance: >3 FB     Dental   Pulmonary sleep apnea ,    breath sounds clear to auscultation       Cardiovascular hypertension,  Rhythm:Regular Rate:Normal     Neuro/Psych    GI/Hepatic negative GI ROS, Neg liver ROS,   Endo/Other  negative endocrine ROS  Renal/GU negative Renal ROS     Musculoskeletal  (+) Arthritis ,   Abdominal   Peds  Hematology   Anesthesia Other Findings   Reproductive/Obstetrics                             Anesthesia Physical Anesthesia Plan  ASA: III  Anesthesia Plan: General   Post-op Pain Management:    Induction: Intravenous  PONV Risk Score and Plan: 1 and Ondansetron, Dexamethasone and Midazolam  Airway Management Planned: LMA  Additional Equipment:   Intra-op Plan:   Post-operative Plan: Extubation in OR  Informed Consent: I have reviewed the patients History and Physical, chart, labs and discussed the procedure including the risks, benefits and alternatives for the proposed anesthesia with the patient or authorized representative who has indicated his/her understanding and acceptance.     Dental advisory given  Plan Discussed with: CRNA and Anesthesiologist  Anesthesia Plan Comments:         Anesthesia Quick Evaluation

## 2019-06-23 NOTE — Anesthesia Postprocedure Evaluation (Signed)
Anesthesia Post Note  Patient: Jacob Arellano  Procedure(s) Performed: KNEE ARTHROSCOPY partial medial meniscectomy and medial femoral condyle subchondroplasty (Left Knee)     Patient location during evaluation: PACU Anesthesia Type: General Level of consciousness: awake Pain management: pain level controlled Vital Signs Assessment: post-procedure vital signs reviewed and stable Respiratory status: spontaneous breathing Cardiovascular status: stable Postop Assessment: no apparent nausea or vomiting Anesthetic complications: no    Last Vitals:  Vitals:   06/23/19 1045 06/23/19 1100  BP: 111/82 114/74  Pulse: 67 66  Resp: 10 17  Temp:    SpO2: 100% 100%    Last Pain:  Vitals:   06/23/19 1100  TempSrc:   PainSc: 4                  Jerome Viglione

## 2019-06-23 NOTE — Brief Op Note (Signed)
06/23/2019  10:31 AM  PATIENT:  Jacob Arellano  63 y.o. male  PRE-OPERATIVE DIAGNOSIS:  Left knee medial meniscus tear and medial femur stress fracture  POST-OPERATIVE DIAGNOSIS:  Left knee medial meniscus tear and medial femur stress fracture  PROCEDURE:  Procedure(s): KNEE ARTHROSCOPY partial medial meniscectomy and medial femoral condyle subchondroplasty (Left)  SURGEON:  Surgeon(s) and Role:    * Nicholes Stairs, MD - Primary  PHYSICIAN ASSISTANT:   ASSISTANTS: none   ANESTHESIA:   general  EBL:  15 mL   BLOOD ADMINISTERED:none  DRAINS: none   LOCAL MEDICATIONS USED:  MARCAINE     SPECIMEN:  No Specimen  DISPOSITION OF SPECIMEN:  N/A  COUNTS:  YES  TOURNIQUET:  * Missing tourniquet times found for documented tourniquets in log: [id number] *  DICTATION: .Note written in EPIC  PLAN OF CARE: Discharge to home after PACU  PATIENT DISPOSITION:  PACU - hemodynamically stable.   Delay start of Pharmacological VTE agent (>24hrs) due to surgical blood loss or risk of bleeding: not applicable

## 2019-06-24 NOTE — Progress Notes (Signed)
Patient is having severe pain in his knee and also having anxiety.  RN instructed him to elevate knee, ice and call his Doctor.

## 2019-06-27 ENCOUNTER — Encounter (HOSPITAL_BASED_OUTPATIENT_CLINIC_OR_DEPARTMENT_OTHER): Payer: Self-pay | Admitting: Orthopedic Surgery

## 2019-08-01 ENCOUNTER — Encounter: Payer: Self-pay | Admitting: Gastroenterology

## 2019-08-15 ENCOUNTER — Ambulatory Visit: Payer: No Typology Code available for payment source | Admitting: Gastroenterology

## 2019-08-17 ENCOUNTER — Encounter (HOSPITAL_COMMUNITY): Payer: Self-pay | Admitting: Occupational Therapy

## 2019-08-17 NOTE — Therapy (Signed)
[place], [place], [address] Phone: [phone]   Fax:  [phone]  Patient Details  Name: SAIM ALMANZA MRN: [id number] Date of Birth: 02/21/1956 Referring Provider:  No ref. provider found  Encounter Date: 08/17/2019  OCCUPATIONAL THERAPY DISCHARGE SUMMARY  Visits from Start of Care:   Current functional level related to goals / functional outcomes: Unknown. Pt did not return to clinic for final visit/reassessment, current functional status is unknown. At last visit pt was doing well, using LUE during ADL completion without difficulty.    Remaining deficits: unknown   Education / Equipment: HEP for ROM and strengthening Plan: Patient agrees to discharge.  Patient goals were partially met. Patient is being discharged due to not returning since the last visit.  ?????       Guadelupe Sabin, OTR/L  [phone] 08/17/2019, 10:57 AM  [facility] [place], [place], [address] Phone: [phone]   Fax:  [phone]

## 2019-08-31 ENCOUNTER — Ambulatory Visit: Payer: No Typology Code available for payment source | Admitting: Gastroenterology

## 2019-09-11 ENCOUNTER — Encounter: Payer: Self-pay | Admitting: Gastroenterology

## 2019-09-11 NOTE — Progress Notes (Signed)
Referring Provider: Celene Squibb, MD Primary Care Physician:  Celene Squibb, MD Primary GI Physician: Dr. Oneida Alar  Chief Complaint  Patient presents with  . Consult    TCS last done 2018    HPI:   Jacob Arellano is a 64 y.o. male presenting today to schedule surveillance colonoscopy.  Last colonoscopy in March 2018 with two 5-7 mm polyps in the sigmoid colon and splenic flexure, one 3 mm polyp in the ascending colon, diverticulosis throughout the entire colon, internal hemorrhoids.  Pathology with tubular adenomas.  Recommendations to repeat colonoscopy in 3 years.  Use Preparation H 4 times daily if needed to relieve rectal pain/pressure/bleeding.   Today: No GI concerns. BMs most every day. No constipation or diarrhea. No abdominal pain. No blood in the stool or black stools. No nausea, vomiting, GERD symptoms, or dysphagia. No unintentional weight loss.     Past Medical History:  Diagnosis Date  . Acute meniscal tear of left knee   . ADHD   . Depression   . Hyperlipidemia   . Hypertension    followed by pcp--- (06-22-2019  per pt had nuclear stress test approx. 2017 @ [facility],  told normal)  . Mild obstructive sleep apnea    per pt does not use cpap on regular basis only on travelling  . OA (osteoarthritis)    knees, hips, shoulders    Past Surgical History:  Procedure Laterality Date  . BICEPS TENDON REPAIR Left 2004  . CATARACT EXTRACTION W/PHACO Right 11/01/2012   Procedure: CATARACT EXTRACTION PHACO AND INTRAOCULAR LENS PLACEMENT (IOC);  Surgeon: Tonny Branch, MD;  Location: [facility];  Service: Ophthalmology;  Laterality: Right;  CDE=7.50  . CATARACT EXTRACTION W/PHACO Left 11/15/2012   Procedure: CATARACT EXTRACTION PHACO AND INTRAOCULAR LENS PLACEMENT (IOC);  Surgeon: Tonny Branch, MD;  Location: [facility];  Service: Ophthalmology;  Laterality: Left;  CDE 6.68  . COLONOSCOPY N/A 10/31/2016   Procedure: COLONOSCOPY;  Surgeon: Danie Binder, MD; 3 tubular adenomas,  diverticulosis throughout the entire colon, internal hemorrhoids.  Repeat in 2021.  Marland Kitchen GANGLION CYST EXCISION Bilateral 1975;  2016   wrist  . KNEE ARTHROSCOPY WITH MENISCAL REPAIR Left 06/23/2019   Procedure: KNEE ARTHROSCOPY partial medial meniscectomy and medial femoral condyle subchondroplasty;  Surgeon: Nicholes Stairs, MD;  Location: [facility];  Service: Orthopedics;  Laterality: Left;  . [place];  2018  . NASAL FRACTURE SURGERY  07-03-2008  '@[facility]'$   . RETINAL DETACHMENT SURGERY Bilateral right x2 last one 2018;  left 2018  . REVERSE SHOULDER ARTHROPLASTY Left 02/23/2019   Procedure: REVERSE SHOULDER ARTHROPLASTY;  Surgeon: Nicholes Stairs, MD;  Location: [place];  Service: Orthopedics;  Laterality: Left;  146mns  . ROTATOR CUFF REPAIR Right 1998;  2017  . SHOULDER ARTHROSCOPY WITH ROTATOR CUFF REPAIR AND OPEN BICEPS TENODESIS Right 12-30-2016  '@[facility]'$    and Capular reconstruction  . TONSILLECTOMY  child  . TOTAL HIP ARTHROPLASTY Right 01-05-2018   '@[facility]'$   . VITRECTOMY Left 2017    Current Outpatient Medications  Medication Sig Dispense Refill  . aspirin EC 81 MG tablet Take 81 mg by mouth every morning.     . citalopram (CELEXA) 20 MG tablet Take 20 mg by mouth at bedtime.     . fenofibrate (TRICOR) 48 MG tablet Take 48 mg by mouth at bedtime.     . lamoTRIgine (LAMICTAL) 150 MG tablet Take 150 mg by mouth 2 (two) times daily.    .Marland Kitchen  lisdexamfetamine (VYVANSE) 40 MG capsule Take 40 mg by mouth daily as needed (ADHD).     Marland Kitchen lisinopril-hydrochlorothiazide (PRINZIDE,ZESTORETIC) 10-12.5 MG per tablet Take 1 tablet by mouth every morning.     . simvastatin (ZOCOR) 40 MG tablet Take 40 mg by mouth every evening.     No current facility-administered medications for this visit.    Allergies as of 09/12/2019  . (No Known Allergies)    Family History  Problem Relation Age of Onset  . Colon cancer Neg Hx     Social History   Socioeconomic  History  . Marital status: Divorced    Spouse name: Not on file  . Number of children: Not on file  . Years of education: Not on file  . Highest education level: Not on file  Occupational History  . Not on file  Tobacco Use  . Smoking status: Never Smoker  . Smokeless tobacco: Never Used  Substance and Sexual Activity  . Alcohol use: Yes    Comment: occasionally  . Drug use: Never  . Sexual activity: Not on file  Other Topics Concern  . Not on file  Social History Narrative  . Not on file   Social Determinants of Health   Financial Resource Strain:   . Difficulty of Paying Living Expenses: Not on file  Food Insecurity:   . Worried About Charity fundraiser in the Last Year: Not on file  . Ran Out of Food in the Last Year: Not on file  Transportation Needs:   . Lack of Transportation (Medical): Not on file  . Lack of Transportation (Non-Medical): Not on file  Physical Activity:   . Days of Exercise per Week: Not on file  . Minutes of Exercise per Session: Not on file  Stress:   . Feeling of Stress : Not on file  Social Connections:   . Frequency of Communication with Friends and Family: Not on file  . Frequency of Social Gatherings with Friends and Family: Not on file  . Attends Religious Services: Not on file  . Active Member of Clubs or Organizations: Not on file  . Attends Archivist Meetings: Not on file  . Marital Status: Not on file    Review of Systems: Gen: Denies fever, chills, lightheadedness, dizziness, presyncope, or syncope. CV: Denies chest pain, palpitations Resp: Denies shortness of breath or cough. GI: See HPI Derm: Denies rash Psych: Denies depression, anxiety  Heme: Denies bruising, bleeding  Physical Exam: BP 119/72   Pulse 73   Temp (!) 97 F (36.1 C) (Oral)   Ht '6\' 1"'$  (1.854 m)   Wt 221 lb (100.2 kg)   BMI 29.16 kg/m  General:   Alert and oriented. No distress noted. Pleasant and cooperative.  Head:  Normocephalic and  atraumatic. Eyes:  Conjuctiva clear without scleral icterus. Heart:  S1, S2 present without murmurs appreciated. Lungs:  Clear to auscultation bilaterally. No wheezes, rales, or rhonchi. No distress.  Abdomen:  +BS, soft, non-tender and non-distended. No rebound or guarding. No HSM or masses noted. Msk:  Symmetrical without gross deformities. Normal posture. Extremities:  Without edema. Neurologic:  Alert and  oriented x4 Psych:  Normal mood and affect.  Labs: 07/12/2019 CBC: WBC 4.9, hemoglobin 12.6 (L), MCV 88, MCH 30.1, MCHC 34.2, platelets 428. CMP: Glucose 108 (H), creatinine 1.02, sodium 140, potassium 5.0, chloride 102, calcium 9.7, albumin 4.9 (H), total bilirubin 0.3, alk phos 81, AST 30, ALT 28 Iron panel: Iron 86, iron  saturation 21%, TIBC 410, ferritin 163 Lipid panel: Total cholesterol 131, triglycerides 55, HDL 48, LDL 71 Hemoglobin A1c 5.8

## 2019-09-12 ENCOUNTER — Encounter: Payer: Self-pay | Admitting: Gastroenterology

## 2019-09-12 ENCOUNTER — Other Ambulatory Visit: Payer: Self-pay

## 2019-09-12 ENCOUNTER — Ambulatory Visit (INDEPENDENT_AMBULATORY_CARE_PROVIDER_SITE_OTHER): Payer: No Typology Code available for payment source | Admitting: Gastroenterology

## 2019-09-12 DIAGNOSIS — Z860101 Personal history of adenomatous and serrated colon polyps: Secondary | ICD-10-CM | POA: Insufficient documentation

## 2019-09-12 DIAGNOSIS — Z86010 Personal history of colonic polyps: Secondary | ICD-10-CM | POA: Insufficient documentation

## 2019-09-12 NOTE — Patient Instructions (Signed)
Unfortunately due to COVID-19, all screening/surveillance procedures have been cancelled. We will call you once we are able to get you scheduled for your surveillance colonoscopy.   Should you have any questions or concerns arise prior to that, do not hesitate to call.   Aliene Altes, PA-C [facility]

## 2019-09-12 NOTE — Assessment & Plan Note (Addendum)
64 year old male presenting to schedule his surveillance colonoscopy. Last colonoscopy March 2018 with 3 tubular adenomas, diverticulosis throughout the entire colon, and internal hemorrhoids. He is without any significant upper or lower GI symptoms. Denies bright red blood per rectum, melena, unintentional weight loss. No family history of colon cancer. Recent labs completed with PCP reviewed dated 07/12/2019. CBC with slightly low hemoglobin at 12.6 but normocytic indices. Iron panel was within normal limits. Of note, he did undergo knee surgery on 06/23/2019 and shoulder surgery in July 2020. I suspect his low hemoglobin is likely rebounding from prior surgeries.  Due to COVID-19, all screening/surveillance procedures have been canceled at this time. We will proceed with colonoscopy once these procedures are open back up.  Proceed with TCS with propofol with Dr. Oneida Alar when possible. The risks, benefits, and alternatives have been discussed in detail with patient. They have stated understanding and desire to proceed.  Propofol due to polypharmacy. Follow-up as recommended at the time of colonoscopy. Patient was advised to call if he had other questions or concerns arise.

## 2019-09-22 ENCOUNTER — Telehealth: Payer: Self-pay | Admitting: *Deleted

## 2019-09-22 NOTE — Telephone Encounter (Signed)
Lmom for pt to call us back.  Need to schedule procedure. 

## 2019-10-05 ENCOUNTER — Encounter: Payer: Self-pay | Admitting: *Deleted

## 2019-10-05 NOTE — Telephone Encounter (Signed)
Mailed letter to pt for him to contact our office to schedule his procedure.

## 2019-10-23 ENCOUNTER — Ambulatory Visit: Payer: No Typology Code available for payment source | Attending: Internal Medicine

## 2019-10-23 ENCOUNTER — Ambulatory Visit: Payer: Self-pay

## 2019-10-23 DIAGNOSIS — Z23 Encounter for immunization: Secondary | ICD-10-CM

## 2019-10-23 NOTE — Progress Notes (Signed)
   Covid-19 Vaccination Clinic  Name:  Jacob Arellano    MRN: [id number] DOB: 1956/02/04  10/23/2019  [name] was observed post Covid-19 immunization for 15 minutes without incidence. He was provided with Vaccine Information Sheet and instruction to access the V-Safe system.   [name] was instructed to call 911 with any severe reactions post vaccine: Marland Kitchen Difficulty breathing  . Swelling of your face and throat  . A fast heartbeat  . A bad rash all over your body  . Dizziness and weakness    Immunizations Administered    Name Date Dose VIS Date Route   Moderna COVID-19 Vaccine 10/23/2019  4:13 PM 0.5 mL 07/26/2019 Intramuscular   Manufacturer: Moderna   Lot: [id number]   [id number]

## 2019-10-24 ENCOUNTER — Ambulatory Visit: Payer: No Typology Code available for payment source | Attending: Internal Medicine

## 2019-10-24 ENCOUNTER — Other Ambulatory Visit: Payer: Self-pay

## 2019-10-24 DIAGNOSIS — Z20822 Contact with and (suspected) exposure to covid-19: Secondary | ICD-10-CM

## 2019-10-25 LAB — NOVEL CORONAVIRUS, NAA: SARS-CoV-2, NAA: NOT DETECTED

## 2019-11-01 ENCOUNTER — Telehealth: Payer: Self-pay | Admitting: Gastroenterology

## 2019-11-01 NOTE — Telephone Encounter (Signed)
Pt received a letter from [facility] that it was time to reschedule his colonoscopy. Please call [phone]

## 2019-11-01 NOTE — Telephone Encounter (Signed)
Pt was seen in office by [facility].  Needs TCS with Propofol.  Routing to clinical pool.

## 2019-11-01 NOTE — Telephone Encounter (Signed)
LMOVM

## 2019-11-02 NOTE — Telephone Encounter (Signed)
Patient called back. He states he really needs his TCS done this month as his insurance plan starts over 11/24/19. I advised currently do not have any openings. He asked where else could he go to have his TCS done sooner.  FYI to [facility]

## 2019-11-02 NOTE — Telephone Encounter (Signed)
Noted. Unfortunately, unless we have a cancellation, we would not be able to get him in prior to his insurance staring over. He is a Dr. Oneida Alar patient. As she is leaving our practice, if he prefers to reach out to other GI clinics to establish care with them, that is an option. However, if he were to do this, all of his care going forward would be with them and we would no longer see him. Unfortunately, I do not know if any other practice would be able to get him in any sooner.

## 2019-11-02 NOTE — Telephone Encounter (Signed)
Noted  

## 2019-11-02 NOTE — Telephone Encounter (Signed)
Called pt. He reports since we are scheduling in May he will have to call back

## 2019-11-02 NOTE — Telephone Encounter (Signed)
noted 

## 2019-11-26 ENCOUNTER — Ambulatory Visit: Payer: No Typology Code available for payment source | Attending: Internal Medicine

## 2019-11-26 DIAGNOSIS — Z23 Encounter for immunization: Secondary | ICD-10-CM

## 2019-11-26 NOTE — Progress Notes (Signed)
   Covid-19 Vaccination Clinic  Name:  Jacob Arellano    MRN: [id number] DOB: [birth year]  11/26/2019  [name] was observed post Covid-19 immunization for 15 minutes without incident. He was provided with Vaccine Information Sheet and instruction to access the V-Safe system.   [name] was instructed to call 911 with any severe reactions post vaccine: Marland Kitchen Difficulty breathing  . Swelling of face and throat  . A fast heartbeat  . A bad rash all over body  . Dizziness and weakness   Immunizations Administered    Name Date Dose VIS Date Route   Moderna COVID-19 Vaccine 11/26/2019 11:08 AM 0.5 mL 07/26/2019 Intramuscular   Manufacturer: Moderna   Lot: [id number]   [id number]

## 2020-07-30 ENCOUNTER — Other Ambulatory Visit: Payer: Self-pay

## 2020-07-30 ENCOUNTER — Encounter: Payer: Self-pay | Admitting: Emergency Medicine

## 2020-07-30 ENCOUNTER — Ambulatory Visit
Admission: EM | Admit: 2020-07-30 | Discharge: 2020-07-30 | Disposition: A | Discharge status: Home / Self Care | Payer: PRIVATE HEALTH INSURANCE | Attending: Family Medicine | Admitting: Family Medicine

## 2020-07-30 DIAGNOSIS — R220 Localized swelling, mass and lump, head: Secondary | ICD-10-CM

## 2020-07-30 DIAGNOSIS — H6981 Other specified disorders of Eustachian tube, right ear: Secondary | ICD-10-CM

## 2020-07-30 DIAGNOSIS — J3489 Other specified disorders of nose and nasal sinuses: Secondary | ICD-10-CM

## 2020-07-30 MED ORDER — DEXAMETHASONE 1 MG/ML PO CONC
10.0000 mg | Freq: Once | ORAL | Status: AC
  Administered 2020-07-30: 10 mg via ORAL

## 2020-07-30 NOTE — Discharge Instructions (Signed)
Purchase over the counter antihistamine to improve sinus congestion symptoms. Apply warm compresses to right side face to reduce facial swelling. Follow-up with primary care if symptoms worsen or do not improve.

## 2020-07-30 NOTE — ED Provider Notes (Addendum)
[facility]    CSN: [id number] Arrival date & time: 07/30/20  1303      History   Chief Complaint Chief Complaint  Patient presents with  . Facial Swelling    HPI Jacob Arellano is a 64 y.o. male.   HPI Patient presents today with right facial swelling, sinus pressure, sharp shooting pains inner right ear. He reports today noticing his right facial cheek was larger and swollen compared to the left. No pain or tenderness. No fever, recent illness, no difficulty swallowing , or pain with mouth movements.  Past Medical History:  Diagnosis Date  . Acute meniscal tear of left knee   . ADHD   . Depression   . Hyperlipidemia   . Hypertension    followed by pcp--- (06-22-2019  per pt had nuclear stress test approx. 2017 @ [facility],  told normal)  . Mild obstructive sleep apnea    per pt does not use cpap on regular basis only on travelling  . OA (osteoarthritis)    knees, hips, shoulders    Patient Active Problem List   Diagnosis Date Noted  . History of adenomatous polyp of colon 09/12/2019  . Rotator cuff arthropathy of left shoulder 02/23/2019  . 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 Hip pain, acute, right 11/17/2017  . Special screening for malignant neoplasms, colon     Past Surgical History:  Procedure Laterality Date  . BICEPS TENDON REPAIR Left 2004  . CATARACT EXTRACTION W/PHACO Right 11/01/2012   Procedure: CATARACT EXTRACTION PHACO AND INTRAOCULAR LENS PLACEMENT (IOC);  Surgeon: Tonny Branch, MD;  Location: [facility];  Service: Ophthalmology;  Laterality: Right;  CDE=7.50  . CATARACT EXTRACTION W/PHACO Left 11/15/2012   Procedure: CATARACT EXTRACTION PHACO AND INTRAOCULAR LENS PLACEMENT (IOC);  Surgeon: Tonny Branch, MD;  Location: [facility];  Service: Ophthalmology;  Laterality: Left;  CDE 6.68  . COLONOSCOPY N/A 10/31/2016   Procedure: COLONOSCOPY;  Surgeon: Danie Binder, MD; 3 tubular adenomas, diverticulosis throughout the entire  colon, internal hemorrhoids.  Repeat in 2021.  Marland Kitchen GANGLION CYST EXCISION Bilateral 1975;  2016   wrist  . KNEE ARTHROSCOPY WITH MENISCAL REPAIR Left 06/23/2019   Procedure: KNEE ARTHROSCOPY partial medial meniscectomy and medial femoral condyle subchondroplasty;  Surgeon: Nicholes Stairs, MD;  Location: [facility];  Service: Orthopedics;  Laterality: Left;  . [place];  2018  . NASAL FRACTURE SURGERY  07-03-2008  @[facility]   . RETINAL DETACHMENT SURGERY Bilateral right x2 last one 2018;  left 2018  . REVERSE SHOULDER ARTHROPLASTY Left 02/23/2019   Procedure: REVERSE SHOULDER ARTHROPLASTY;  Surgeon: Nicholes Stairs, MD;  Location: [place];  Service: Orthopedics;  Laterality: Left;  147mins  . ROTATOR CUFF REPAIR Right 1998;  2017  . SHOULDER ARTHROSCOPY WITH ROTATOR CUFF REPAIR AND OPEN BICEPS TENODESIS Right 12-30-2016  @[facility]    and Capular reconstruction  . TONSILLECTOMY  child  . TOTAL HIP ARTHROPLASTY Right 01-05-2018   @[facility]   . VITRECTOMY Left 2017       Home Medications    Prior to Admission medications   Medication Sig Start Date End Date Taking? Authorizing Provider  aspirin EC 81 MG tablet Take 81 mg by mouth every morning.    Yes [provider]  citalopram (CELEXA) 20 MG tablet Take 20 mg by mouth at bedtime.    Yes [provider]  fenofibrate (TRICOR) 48 MG tablet Take 48 mg by mouth at bedtime.  Yes [provider]  lamoTRIgine (LAMICTAL) 150 MG tablet Take 150 mg by mouth 2 (two) times daily. 08/14/16  Yes [provider]  lisinopril-hydrochlorothiazide (PRINZIDE,ZESTORETIC) 10-12.5 MG per tablet Take 1 tablet by mouth every morning.    Yes [provider]  simvastatin (ZOCOR) 40 MG tablet Take 40 mg by mouth every evening.   Yes [provider]  lisdexamfetamine (VYVANSE) 40 MG capsule Take 40 mg by mouth daily as needed (ADHD).     [provider]    Family  History Family History  Problem Relation Age of Onset  . Colon cancer Neg Hx     Social History Social History   Tobacco Use  . Smoking status: Never Smoker  . Smokeless tobacco: Never Used  Vaping Use  . Vaping Use: Never used  Substance Use Topics  . Alcohol use: Yes    Comment: occasionally  . Drug use: Never     Allergies   Patient has no known allergies.   Review of Systems Review of Systems Pertinent negatives listed in HPI   Physical Exam Triage Vital Signs ED Triage Vitals  Enc Vitals Group     BP 07/30/20 1321 119/75     Pulse Rate 07/30/20 1321 80     Resp 07/30/20 1321 20     Temp 07/30/20 1321 (!) 97.5 F (36.4 C)     Temp Source 07/30/20 1321 Oral     SpO2 07/30/20 1321 95 %     Weight --      Height --      Head Circumference --      Peak Flow --      Pain Score 07/30/20 1318 3     Pain Loc --      Pain Edu? --      Excl. in [place]? --    No data found.  Updated Vital Signs BP 119/75 (BP Location: Right Arm)   Pulse 80   Temp (!) 97.5 F (36.4 C) (Oral)   Resp 20   SpO2 95%   Visual Acuity Right Eye Distance:   Left Eye Distance:   Bilateral Distance:    Right Eye Near:   Left Eye Near:    Bilateral Near:     Physical Exam Constitutional:      Appearance: Normal appearance. He is not ill-appearing.  HENT:     Head: Normocephalic.     Right Ear: Tympanic membrane, ear canal and external ear normal. There is no impacted cerumen.     Left Ear: Tympanic membrane, ear canal and external ear normal. There is no impacted cerumen.     Nose: Congestion present.     Mouth/Throat:     Mouth: Mucous membranes are dry.     Pharynx: Oropharynx is clear.  Eyes:     Extraocular Movements: Extraocular movements intact.     Pupils: Pupils are equal, round, and reactive to light.  Cardiovascular:     Rate and Rhythm: Normal rate and regular rhythm.  Pulmonary:     Effort: Pulmonary effort is normal.     Breath sounds: Normal breath sounds.   Musculoskeletal:     Cervical back: Normal range of motion and neck supple.  Skin:    General: Skin is warm and dry.     Capillary Refill: Capillary refill takes less than 2 seconds.  Neurological:     General: No focal deficit present.     Mental Status: He is alert.  Psychiatric:  Mood and Affect: Mood normal.        Behavior: Behavior normal.        Thought Content: Thought content normal.        Judgment: Judgment normal.      UC Treatments / Results  Labs (all labs ordered are listed, but only abnormal results are displayed) Labs Reviewed - No data to display  EKG   Radiology No results found.  Procedures Procedures (including critical care time)  Medications Ordered in UC Medications - No data to display  Initial Impression / Assessment and Plan / UC Course  I have reviewed the triage vital signs and the nursing notes.  Pertinent labs & imaging results that were available during my care of the patient were reviewed by me and considered in my medical decision making (see chart for details).    Right facial swelling, no appreciative facial swelling noted on exam. Nasal congestion present on exam which is likely the source of facial pressure and ET tube dysfunction.  Symptom management recommended with antihistamines.  Strict red flag precaution given. Return if symptoms worsen. Final Clinical Impressions(s) / UC Diagnoses   Final diagnoses:  Right facial swelling  ETD (Eustachian tube dysfunction), right  Sinus pressure, right     Discharge Instructions     Purchase over the counter antihistamine to improve sinus congestion symptoms. Apply warm compresses to right side face to reduce facial swelling. Follow-up with primary care if symptoms worsen or do not improve.    ED Prescriptions    None     PDMP not reviewed this encounter.   Scot Jun, FNP 08/12/20 [place], [place], FNP 08/12/20 2201

## 2020-07-30 NOTE — ED Triage Notes (Addendum)
Right side of face with swelling, right ear feeling odd.  Patient has a slight headache on right side of face around right eye.  No known fever.  No drooping of face.    Reports back of head sore to the touch for the past few days

## 2021-02-13 ENCOUNTER — Encounter: Payer: Self-pay | Admitting: Internal Medicine

## 2021-03-28 ENCOUNTER — Ambulatory Visit: Payer: No Typology Code available for payment source | Admitting: Internal Medicine

## 2021-10-17 DIAGNOSIS — F332 Major depressive disorder, recurrent severe without psychotic features: Secondary | ICD-10-CM | POA: Diagnosis not present

## 2021-11-27 DIAGNOSIS — F332 Major depressive disorder, recurrent severe without psychotic features: Secondary | ICD-10-CM | POA: Diagnosis not present

## 2021-12-18 DIAGNOSIS — R7301 Impaired fasting glucose: Secondary | ICD-10-CM | POA: Diagnosis not present

## 2021-12-18 DIAGNOSIS — I10 Essential (primary) hypertension: Secondary | ICD-10-CM | POA: Diagnosis not present

## 2021-12-24 DIAGNOSIS — E782 Mixed hyperlipidemia: Secondary | ICD-10-CM | POA: Diagnosis not present

## 2021-12-24 DIAGNOSIS — D649 Anemia, unspecified: Secondary | ICD-10-CM | POA: Diagnosis not present

## 2021-12-24 DIAGNOSIS — I10 Essential (primary) hypertension: Secondary | ICD-10-CM | POA: Diagnosis not present

## 2021-12-24 DIAGNOSIS — R7301 Impaired fasting glucose: Secondary | ICD-10-CM | POA: Diagnosis not present

## 2022-01-02 DIAGNOSIS — G5603 Carpal tunnel syndrome, bilateral upper limbs: Secondary | ICD-10-CM | POA: Diagnosis not present

## 2022-01-02 DIAGNOSIS — G5601 Carpal tunnel syndrome, right upper limb: Secondary | ICD-10-CM | POA: Diagnosis not present

## 2022-01-29 DIAGNOSIS — F332 Major depressive disorder, recurrent severe without psychotic features: Secondary | ICD-10-CM | POA: Diagnosis not present

## 2022-02-05 DIAGNOSIS — G5623 Lesion of ulnar nerve, bilateral upper limbs: Secondary | ICD-10-CM | POA: Diagnosis not present

## 2022-02-05 DIAGNOSIS — G5603 Carpal tunnel syndrome, bilateral upper limbs: Secondary | ICD-10-CM | POA: Diagnosis not present

## 2022-02-10 DIAGNOSIS — M7711 Lateral epicondylitis, right elbow: Secondary | ICD-10-CM | POA: Diagnosis not present

## 2022-02-10 DIAGNOSIS — G5623 Lesion of ulnar nerve, bilateral upper limbs: Secondary | ICD-10-CM | POA: Diagnosis not present

## 2022-02-10 DIAGNOSIS — M13841 Other specified arthritis, right hand: Secondary | ICD-10-CM | POA: Diagnosis not present

## 2022-02-10 DIAGNOSIS — G5603 Carpal tunnel syndrome, bilateral upper limbs: Secondary | ICD-10-CM | POA: Diagnosis not present

## 2022-03-06 DIAGNOSIS — H26491 Other secondary cataract, right eye: Secondary | ICD-10-CM | POA: Diagnosis not present

## 2022-03-06 DIAGNOSIS — H33311 Horseshoe tear of retina without detachment, right eye: Secondary | ICD-10-CM | POA: Diagnosis not present

## 2022-03-06 DIAGNOSIS — H35372 Puckering of macula, left eye: Secondary | ICD-10-CM | POA: Diagnosis not present

## 2022-03-06 DIAGNOSIS — H33022 Retinal detachment with multiple breaks, left eye: Secondary | ICD-10-CM | POA: Diagnosis not present

## 2022-03-17 DIAGNOSIS — Z9889 Other specified postprocedural states: Secondary | ICD-10-CM | POA: Diagnosis not present

## 2022-03-17 DIAGNOSIS — Z96641 Presence of right artificial hip joint: Secondary | ICD-10-CM | POA: Diagnosis not present

## 2022-03-17 DIAGNOSIS — Z471 Aftercare following joint replacement surgery: Secondary | ICD-10-CM | POA: Diagnosis not present

## 2022-03-28 DIAGNOSIS — G5601 Carpal tunnel syndrome, right upper limb: Secondary | ICD-10-CM | POA: Diagnosis not present

## 2022-03-28 DIAGNOSIS — G5621 Lesion of ulnar nerve, right upper limb: Secondary | ICD-10-CM | POA: Diagnosis not present

## 2022-04-14 DIAGNOSIS — M25621 Stiffness of right elbow, not elsewhere classified: Secondary | ICD-10-CM | POA: Diagnosis not present

## 2022-04-14 DIAGNOSIS — M25631 Stiffness of right wrist, not elsewhere classified: Secondary | ICD-10-CM | POA: Diagnosis not present

## 2022-04-23 DIAGNOSIS — F332 Major depressive disorder, recurrent severe without psychotic features: Secondary | ICD-10-CM | POA: Diagnosis not present

## 2022-05-23 ENCOUNTER — Ambulatory Visit (INDEPENDENT_AMBULATORY_CARE_PROVIDER_SITE_OTHER): Payer: Medicare Other

## 2022-05-23 ENCOUNTER — Ambulatory Visit
Admission: EM | Admit: 2022-05-23 | Discharge: 2022-05-23 | Disposition: A | Discharge status: Home / Self Care | Payer: Medicare Other | Attending: Emergency Medicine | Admitting: Emergency Medicine

## 2022-05-23 DIAGNOSIS — M25521 Pain in right elbow: Secondary | ICD-10-CM | POA: Diagnosis not present

## 2022-05-23 DIAGNOSIS — M25421 Effusion, right elbow: Secondary | ICD-10-CM

## 2022-05-23 MED ORDER — NAPROXEN 500 MG PO TABS: 500.0000 mg | ORAL_TABLET | Freq: Two times a day (BID) | ORAL | 0 refills | Status: AC

## 2022-05-23 NOTE — ED Provider Notes (Signed)
HPI  SUBJECTIVE:  Jacob Arellano is a right-handed 66 y.o. male who presents with 6 days of a painful mass along the right lateral epicondyle/radial head.  Questionable trauma, he thinks that he may have hit it on a door, but cannot clearly recall any specific event.  He denies repetitive activities or motions.  No change in his baseline distal numbness or tingling.  No grip weakness, bruising, erythema, joint swelling.  He tried ice packs with some improvement in his symptoms.  He has not taken any medications for this.  Symptoms are worse with extending his arm, full flexion and with gripping objects.  There is no pain with wrist range of motion.  He has a past medical history of osteoarthritis, hypertension, hypercholesterolemia.  He is status post right cubital tunnel surgery and carpal tunnel surgery on August 5.  No history of chronic kidney disease.  PCP: [facility].  Orthopedics: [facility] in [place].    Past Medical History:  Diagnosis Date   Acute meniscal tear of left knee    ADHD    Depression    Hyperlipidemia    Hypertension    followed by pcp--- (06-22-2019  per pt had nuclear stress test approx. 2017 @ [facility],  told normal)   Mild obstructive sleep apnea    per pt does not use cpap on regular basis only on travelling   OA (osteoarthritis)    knees, hips, shoulders    Past Surgical History:  Procedure Laterality Date   BICEPS TENDON REPAIR Left 2004   CATARACT EXTRACTION W/PHACO Right 11/01/2012   Procedure: CATARACT EXTRACTION PHACO AND INTRAOCULAR LENS PLACEMENT ([place]);  Surgeon: Tonny Branch, MD;  Location: [facility];  Service: Ophthalmology;  Laterality: Right;  CDE=7.50   CATARACT EXTRACTION W/PHACO Left 11/15/2012   Procedure: CATARACT EXTRACTION PHACO AND INTRAOCULAR LENS PLACEMENT ([facility]);  Surgeon: Tonny Branch, MD;  Location: [facility];  Service: Ophthalmology;  Laterality: Left;  CDE 6.68   COLONOSCOPY N/A 10/31/2016   Procedure: COLONOSCOPY;  Surgeon: Danie Binder, MD; 3 tubular adenomas, diverticulosis throughout the entire colon, internal hemorrhoids.  Repeat in 2021.   GANGLION CYST EXCISION Bilateral 1975;  2016   wrist   KNEE ARTHROSCOPY WITH MENISCAL REPAIR Left 06/23/2019   Procedure: KNEE ARTHROSCOPY partial medial meniscectomy and medial femoral condyle subchondroplasty;  Surgeon: Nicholes Stairs, MD;  Location: [facility];  Service: Orthopedics;  Laterality: Left;   [place];  2018   NASAL FRACTURE SURGERY  07-03-2008  '@[facility]'$    RETINAL DETACHMENT SURGERY Bilateral right x2 last one 2018;  left 2018   REVERSE SHOULDER ARTHROPLASTY Left 02/23/2019   Procedure: REVERSE SHOULDER ARTHROPLASTY;  Surgeon: Nicholes Stairs, MD;  Location: [facility];  Service: Orthopedics;  Laterality: Left;  144mns   ROTATOR CUFF REPAIR Right 1998;  2017   SHOULDER ARTHROSCOPY WITH ROTATOR CUFF REPAIR AND OPEN BICEPS TENODESIS Right 12-30-2016  '@[facility]'$    and Capular reconstruction   TONSILLECTOMY  child   TOTAL HIP ARTHROPLASTY Right 01-05-2018   '@[facility]'$    VITRECTOMY Left 2017    Family History  Problem Relation Age of Onset   Colon cancer Neg Hx     Social History   Tobacco Use   Smoking status: Never   Smokeless tobacco: Never  Vaping Use   Vaping Use: Never used  Substance Use Topics   Alcohol use: Yes    Comment: occasionally   Drug use: Never    No current  facility-administered medications for this encounter.  Current Outpatient Medications:    naproxen (NAPROSYN) 500 MG tablet, Take 1 tablet (500 mg total) by mouth 2 (two) times daily., Disp: 20 tablet, Rfl: 0   aspirin EC 81 MG tablet, Take 81 mg by mouth every morning. , Disp: , Rfl:    citalopram (CELEXA) 20 MG tablet, Take 20 mg by mouth at bedtime. , Disp: , Rfl:    fenofibrate (TRICOR) 48 MG tablet, Take 48 mg by mouth at bedtime. , Disp: , Rfl:    lamoTRIgine (LAMICTAL) 150 MG tablet, Take 150 mg by mouth 2 (two) times daily., Disp: , Rfl:     lisdexamfetamine (VYVANSE) 40 MG capsule, Take 40 mg by mouth daily as needed (ADHD). , Disp: , Rfl:    lisinopril-hydrochlorothiazide (PRINZIDE,ZESTORETIC) 10-12.5 MG per tablet, Take 1 tablet by mouth every morning. , Disp: , Rfl:    simvastatin (ZOCOR) 40 MG tablet, Take 40 mg by mouth every evening., Disp: , Rfl:   No Known Allergies   ROS  As noted in HPI.   Physical Exam  BP 136/77 (BP Location: Right Arm)   Pulse 79   Temp 98.1 F (36.7 C) (Oral)   Resp 16   SpO2 96%   Constitutional: Well developed, well nourished, no acute distress Eyes:  EOMI, conjunctiva normal bilaterally HENT: Normocephalic, atraumatic,mucus membranes moist Respiratory: Normal inspiratory effort Cardiovascular: Normal rate GI: nondistended skin: No rash, skin intact Musculoskeletal: Right elbow ROM Decreased.  Unable to fully extend.  Able to fully flex.  Supracondylar region tender, Radial head tender, tender nonerythematous swelling over the radial head, Olecrenon process tender, Medial epicondyle tender, Lateral epicondyle  tender.forearm NT, Wrist NT, Hand NT with distal NVI , radial pulse intact, Sensation LT and Motor intact distally in distribution of radial, median, and ulnar nerve function.  Well-healed, nontender scar over medial epicondyle.  No joint swelling, erythema, increased temperature.  No pain with radial/ulnar deviation, supination/pronation, wrist flexion/extension. Neurologic: Alert & oriented x 3, no focal neuro deficits Psychiatric: Speech and behavior appropriate   ED Course   Medications - No data to display  Orders Placed This Encounter  Procedures   DG Elbow Complete Right    Standing Status:   Standing    Number of Occurrences:   1    Order Specific Question:   Reason for Exam (SYMPTOM  OR DIAGNOSIS REQUIRED)    Answer:   Diffuse joint tenderness, super condylar tenderness,  swelling at the radial head, rule out subluxation, fracture. . cubital tunnel surgery  approximately 6 weeks ago    No results found for this or any previous visit (from the past 24 hour(s)). DG Elbow Complete Right  Result Date: 05/23/2022 CLINICAL DATA:  Right elbow pain and swelling for the past 5 days. Recent cubital tunnel surgery 6 weeks ago. No specific injury. EXAM: RIGHT ELBOW - COMPLETE 3+ VIEW COMPARISON:  None Available. FINDINGS: No acute fracture or dislocation. Moderate joint effusion with elevation of the anterior and posterior fat pads. There is no evidence of arthropathy or other focal bone abnormality. Soft tissue swelling along the ulnar and posterior aspects of the elbow. IMPRESSION: 1. Moderate joint effusion with soft tissue swelling along the ulnar and posterior aspects of the elbow. No acute osseous abnormality. In the absence of acute trauma, infectious or inflammatory arthropathy should be considered. Electronically Signed   By: Titus Dubin M.D.   On: 05/23/2022 13:08    ED Clinical Impression  1. Effusion  of right elbow      ED Assessment/Plan     Patient has diffuse elbow tenderness and swelling over the radial head.  Checking elbow x-ray.  Reviewed imaging independently.moderate joint effusion with soft tissue swelling along the ulnar posterior aspects of the elbow.  No acute osseous abnormalities.  In the absence of acute trauma, infectious, inflammatory arthropathy should be considered.  See radiology report for full details.  I do not think that the patient has a septic joint at this time as it is not erythematous, and he is able to move it.  He has not had any systemic symptoms such as tachycardia or fever.  Will have him follow-up with his hand surgeon ASAP for further management.  Discussed with him that this could be an inflammatory arthropathy.  I do not think that this is from his recent surgery that was about 6 weeks ago.  Home with Tylenol/Naprosyn.  Continue ice or heat, whichever feels better, Ace wrap.  He is to go to the ED for any  fevers, or other concerns.   Discussed imaging, MDM, treatment plan, and plan for follow-up with patient. Discussed sn/sx that should prompt return to the ED. patient agrees with plan.   Meds ordered this encounter  Medications   naproxen (NAPROSYN) 500 MG tablet    Sig: Take 1 tablet (500 mg total) by mouth 2 (two) times daily.    Dispense:  20 tablet    Refill:  0      *This clinic note was created using [employer]. Therefore, there may be occasional mistakes despite careful proofreading.  ?    Melynda Ripple, MD 05/23/22 1654

## 2022-05-23 NOTE — ED Triage Notes (Signed)
Pt reports pain and swelling in right elbow 5 days. "I injured some how".   Pt reports soreness in the right inde finger x 2 weeks.

## 2022-05-23 NOTE — Discharge Instructions (Addendum)
Please give grandma, Dr. Apolonio Schneiders, or Dr. Stann Mainland or call today and let them know that you have an atraumatic elbow effusion and that she needs to be seen ASAP.  In the meantime you can try an Ace wrap, heat or ice, whichever feels better, take at 1000 mg of Tylenol combined with 500 mg of Naprosyn twice a day as needed for pain.  Go to the ER for fevers above 100.4, if your elbow turns red, hot, or for any other concerns.

## 2022-05-28 DIAGNOSIS — Z4789 Encounter for other orthopedic aftercare: Secondary | ICD-10-CM | POA: Diagnosis not present

## 2022-05-28 DIAGNOSIS — M7711 Lateral epicondylitis, right elbow: Secondary | ICD-10-CM | POA: Diagnosis not present

## 2022-05-28 DIAGNOSIS — M67321 Transient synovitis, right elbow: Secondary | ICD-10-CM | POA: Diagnosis not present

## 2022-05-29 ENCOUNTER — Ambulatory Visit: Payer: No Typology Code available for payment source | Admitting: Nutrition

## 2022-06-25 DIAGNOSIS — M7711 Lateral epicondylitis, right elbow: Secondary | ICD-10-CM | POA: Diagnosis not present

## 2022-06-25 DIAGNOSIS — Z4789 Encounter for other orthopedic aftercare: Secondary | ICD-10-CM | POA: Diagnosis not present

## 2022-06-25 DIAGNOSIS — M67321 Transient synovitis, right elbow: Secondary | ICD-10-CM | POA: Diagnosis not present

## 2022-06-26 DIAGNOSIS — I10 Essential (primary) hypertension: Secondary | ICD-10-CM | POA: Diagnosis not present

## 2022-06-26 DIAGNOSIS — Z125 Encounter for screening for malignant neoplasm of prostate: Secondary | ICD-10-CM | POA: Diagnosis not present

## 2022-06-26 DIAGNOSIS — R7301 Impaired fasting glucose: Secondary | ICD-10-CM | POA: Diagnosis not present

## 2022-07-02 DIAGNOSIS — Z4789 Encounter for other orthopedic aftercare: Secondary | ICD-10-CM | POA: Diagnosis not present

## 2022-07-02 DIAGNOSIS — M25521 Pain in right elbow: Secondary | ICD-10-CM | POA: Diagnosis not present

## 2022-07-02 DIAGNOSIS — M7711 Lateral epicondylitis, right elbow: Secondary | ICD-10-CM | POA: Diagnosis not present

## 2022-07-02 DIAGNOSIS — M67321 Transient synovitis, right elbow: Secondary | ICD-10-CM | POA: Diagnosis not present

## 2022-07-03 DIAGNOSIS — R7303 Prediabetes: Secondary | ICD-10-CM | POA: Diagnosis not present

## 2022-07-03 DIAGNOSIS — I10 Essential (primary) hypertension: Secondary | ICD-10-CM | POA: Diagnosis not present

## 2022-07-03 DIAGNOSIS — D649 Anemia, unspecified: Secondary | ICD-10-CM | POA: Diagnosis not present

## 2022-07-03 DIAGNOSIS — E782 Mixed hyperlipidemia: Secondary | ICD-10-CM | POA: Diagnosis not present

## 2022-07-22 DIAGNOSIS — M25521 Pain in right elbow: Secondary | ICD-10-CM | POA: Diagnosis not present

## 2022-07-30 DIAGNOSIS — M67321 Transient synovitis, right elbow: Secondary | ICD-10-CM | POA: Diagnosis not present

## 2022-07-30 DIAGNOSIS — M25521 Pain in right elbow: Secondary | ICD-10-CM | POA: Diagnosis not present

## 2022-07-30 DIAGNOSIS — Z4789 Encounter for other orthopedic aftercare: Secondary | ICD-10-CM | POA: Diagnosis not present

## 2022-07-30 DIAGNOSIS — M7711 Lateral epicondylitis, right elbow: Secondary | ICD-10-CM | POA: Diagnosis not present

## 2022-09-18 DIAGNOSIS — M1612 Unilateral primary osteoarthritis, left hip: Secondary | ICD-10-CM | POA: Diagnosis not present

## 2022-09-18 DIAGNOSIS — R7309 Other abnormal glucose: Secondary | ICD-10-CM | POA: Diagnosis not present

## 2022-09-18 DIAGNOSIS — M170 Bilateral primary osteoarthritis of knee: Secondary | ICD-10-CM | POA: Diagnosis not present

## 2022-09-18 DIAGNOSIS — M7071 Other bursitis of hip, right hip: Secondary | ICD-10-CM | POA: Diagnosis not present

## 2022-09-18 DIAGNOSIS — M1712 Unilateral primary osteoarthritis, left knee: Secondary | ICD-10-CM | POA: Diagnosis not present

## 2022-09-30 DIAGNOSIS — M170 Bilateral primary osteoarthritis of knee: Secondary | ICD-10-CM | POA: Diagnosis not present

## 2022-10-02 DIAGNOSIS — Z01810 Encounter for preprocedural cardiovascular examination: Secondary | ICD-10-CM | POA: Diagnosis not present

## 2022-10-02 DIAGNOSIS — Z01818 Encounter for other preprocedural examination: Secondary | ICD-10-CM | POA: Diagnosis not present

## 2022-11-28 DIAGNOSIS — M170 Bilateral primary osteoarthritis of knee: Secondary | ICD-10-CM | POA: Diagnosis not present

## 2022-12-08 DIAGNOSIS — G8918 Other acute postprocedural pain: Secondary | ICD-10-CM | POA: Diagnosis not present

## 2022-12-08 DIAGNOSIS — Z96652 Presence of left artificial knee joint: Secondary | ICD-10-CM | POA: Diagnosis not present

## 2022-12-08 DIAGNOSIS — M1712 Unilateral primary osteoarthritis, left knee: Secondary | ICD-10-CM | POA: Diagnosis not present

## 2022-12-18 DIAGNOSIS — Z96652 Presence of left artificial knee joint: Secondary | ICD-10-CM | POA: Diagnosis not present

## 2022-12-18 DIAGNOSIS — Z9889 Other specified postprocedural states: Secondary | ICD-10-CM | POA: Diagnosis not present

## 2022-12-19 DIAGNOSIS — M62562 Muscle wasting and atrophy, not elsewhere classified, left lower leg: Secondary | ICD-10-CM | POA: Diagnosis not present

## 2022-12-19 DIAGNOSIS — M25562 Pain in left knee: Secondary | ICD-10-CM | POA: Diagnosis not present

## 2022-12-19 DIAGNOSIS — M1712 Unilateral primary osteoarthritis, left knee: Secondary | ICD-10-CM | POA: Diagnosis not present

## 2022-12-19 DIAGNOSIS — M25662 Stiffness of left knee, not elsewhere classified: Secondary | ICD-10-CM | POA: Diagnosis not present

## 2022-12-24 DIAGNOSIS — M25662 Stiffness of left knee, not elsewhere classified: Secondary | ICD-10-CM | POA: Diagnosis not present

## 2022-12-24 DIAGNOSIS — M1712 Unilateral primary osteoarthritis, left knee: Secondary | ICD-10-CM | POA: Diagnosis not present

## 2022-12-24 DIAGNOSIS — M25562 Pain in left knee: Secondary | ICD-10-CM | POA: Diagnosis not present

## 2022-12-24 DIAGNOSIS — M62562 Muscle wasting and atrophy, not elsewhere classified, left lower leg: Secondary | ICD-10-CM | POA: Diagnosis not present

## 2022-12-26 DIAGNOSIS — M25562 Pain in left knee: Secondary | ICD-10-CM | POA: Diagnosis not present

## 2022-12-26 DIAGNOSIS — M25662 Stiffness of left knee, not elsewhere classified: Secondary | ICD-10-CM | POA: Diagnosis not present

## 2022-12-26 DIAGNOSIS — M62562 Muscle wasting and atrophy, not elsewhere classified, left lower leg: Secondary | ICD-10-CM | POA: Diagnosis not present

## 2022-12-26 DIAGNOSIS — M1712 Unilateral primary osteoarthritis, left knee: Secondary | ICD-10-CM | POA: Diagnosis not present

## 2022-12-31 DIAGNOSIS — M25662 Stiffness of left knee, not elsewhere classified: Secondary | ICD-10-CM | POA: Diagnosis not present

## 2022-12-31 DIAGNOSIS — M25562 Pain in left knee: Secondary | ICD-10-CM | POA: Diagnosis not present

## 2022-12-31 DIAGNOSIS — M62562 Muscle wasting and atrophy, not elsewhere classified, left lower leg: Secondary | ICD-10-CM | POA: Diagnosis not present

## 2022-12-31 DIAGNOSIS — M1712 Unilateral primary osteoarthritis, left knee: Secondary | ICD-10-CM | POA: Diagnosis not present

## 2023-01-01 DIAGNOSIS — F332 Major depressive disorder, recurrent severe without psychotic features: Secondary | ICD-10-CM | POA: Diagnosis not present

## 2023-01-02 DIAGNOSIS — M62562 Muscle wasting and atrophy, not elsewhere classified, left lower leg: Secondary | ICD-10-CM | POA: Diagnosis not present

## 2023-01-02 DIAGNOSIS — M1712 Unilateral primary osteoarthritis, left knee: Secondary | ICD-10-CM | POA: Diagnosis not present

## 2023-01-02 DIAGNOSIS — M25562 Pain in left knee: Secondary | ICD-10-CM | POA: Diagnosis not present

## 2023-01-02 DIAGNOSIS — M25662 Stiffness of left knee, not elsewhere classified: Secondary | ICD-10-CM | POA: Diagnosis not present

## 2023-01-06 DIAGNOSIS — M62562 Muscle wasting and atrophy, not elsewhere classified, left lower leg: Secondary | ICD-10-CM | POA: Diagnosis not present

## 2023-01-06 DIAGNOSIS — M25662 Stiffness of left knee, not elsewhere classified: Secondary | ICD-10-CM | POA: Diagnosis not present

## 2023-01-06 DIAGNOSIS — M1712 Unilateral primary osteoarthritis, left knee: Secondary | ICD-10-CM | POA: Diagnosis not present

## 2023-01-06 DIAGNOSIS — M25562 Pain in left knee: Secondary | ICD-10-CM | POA: Diagnosis not present

## 2023-01-07 DIAGNOSIS — M1712 Unilateral primary osteoarthritis, left knee: Secondary | ICD-10-CM | POA: Diagnosis not present

## 2023-01-07 DIAGNOSIS — M25662 Stiffness of left knee, not elsewhere classified: Secondary | ICD-10-CM | POA: Diagnosis not present

## 2023-01-07 DIAGNOSIS — M62562 Muscle wasting and atrophy, not elsewhere classified, left lower leg: Secondary | ICD-10-CM | POA: Diagnosis not present

## 2023-01-07 DIAGNOSIS — M25562 Pain in left knee: Secondary | ICD-10-CM | POA: Diagnosis not present

## 2023-01-13 DIAGNOSIS — M25662 Stiffness of left knee, not elsewhere classified: Secondary | ICD-10-CM | POA: Diagnosis not present

## 2023-01-13 DIAGNOSIS — M25562 Pain in left knee: Secondary | ICD-10-CM | POA: Diagnosis not present

## 2023-01-13 DIAGNOSIS — M1712 Unilateral primary osteoarthritis, left knee: Secondary | ICD-10-CM | POA: Diagnosis not present

## 2023-01-13 DIAGNOSIS — M62562 Muscle wasting and atrophy, not elsewhere classified, left lower leg: Secondary | ICD-10-CM | POA: Diagnosis not present

## 2023-01-14 DIAGNOSIS — M1712 Unilateral primary osteoarthritis, left knee: Secondary | ICD-10-CM | POA: Diagnosis not present

## 2023-01-14 DIAGNOSIS — M25562 Pain in left knee: Secondary | ICD-10-CM | POA: Diagnosis not present

## 2023-01-14 DIAGNOSIS — M62562 Muscle wasting and atrophy, not elsewhere classified, left lower leg: Secondary | ICD-10-CM | POA: Diagnosis not present

## 2023-01-14 DIAGNOSIS — M25662 Stiffness of left knee, not elsewhere classified: Secondary | ICD-10-CM | POA: Diagnosis not present

## 2023-01-15 DIAGNOSIS — Z96652 Presence of left artificial knee joint: Secondary | ICD-10-CM | POA: Diagnosis not present

## 2023-01-21 DIAGNOSIS — M62562 Muscle wasting and atrophy, not elsewhere classified, left lower leg: Secondary | ICD-10-CM | POA: Diagnosis not present

## 2023-01-21 DIAGNOSIS — M25662 Stiffness of left knee, not elsewhere classified: Secondary | ICD-10-CM | POA: Diagnosis not present

## 2023-01-21 DIAGNOSIS — M25562 Pain in left knee: Secondary | ICD-10-CM | POA: Diagnosis not present

## 2023-01-21 DIAGNOSIS — M1712 Unilateral primary osteoarthritis, left knee: Secondary | ICD-10-CM | POA: Diagnosis not present

## 2023-01-22 DIAGNOSIS — M1712 Unilateral primary osteoarthritis, left knee: Secondary | ICD-10-CM | POA: Diagnosis not present

## 2023-01-22 DIAGNOSIS — M25562 Pain in left knee: Secondary | ICD-10-CM | POA: Diagnosis not present

## 2023-01-22 DIAGNOSIS — M25662 Stiffness of left knee, not elsewhere classified: Secondary | ICD-10-CM | POA: Diagnosis not present

## 2023-01-22 DIAGNOSIS — M62562 Muscle wasting and atrophy, not elsewhere classified, left lower leg: Secondary | ICD-10-CM | POA: Diagnosis not present

## 2023-01-29 DIAGNOSIS — F332 Major depressive disorder, recurrent severe without psychotic features: Secondary | ICD-10-CM | POA: Diagnosis not present

## 2023-03-11 DIAGNOSIS — K080 Exfoliation of teeth due to systemic causes: Secondary | ICD-10-CM | POA: Diagnosis not present

## 2023-04-07 DIAGNOSIS — R7303 Prediabetes: Secondary | ICD-10-CM | POA: Diagnosis not present

## 2023-04-07 DIAGNOSIS — Z125 Encounter for screening for malignant neoplasm of prostate: Secondary | ICD-10-CM | POA: Diagnosis not present

## 2023-04-07 DIAGNOSIS — I10 Essential (primary) hypertension: Secondary | ICD-10-CM | POA: Diagnosis not present

## 2023-04-15 DIAGNOSIS — E782 Mixed hyperlipidemia: Secondary | ICD-10-CM | POA: Diagnosis not present

## 2023-04-15 DIAGNOSIS — Z6828 Body mass index (BMI) 28.0-28.9, adult: Secondary | ICD-10-CM | POA: Diagnosis not present

## 2023-04-15 DIAGNOSIS — R7303 Prediabetes: Secondary | ICD-10-CM | POA: Diagnosis not present

## 2023-04-15 DIAGNOSIS — I10 Essential (primary) hypertension: Secondary | ICD-10-CM | POA: Diagnosis not present

## 2023-04-15 DIAGNOSIS — E663 Overweight: Secondary | ICD-10-CM | POA: Diagnosis not present

## 2023-04-16 DIAGNOSIS — F332 Major depressive disorder, recurrent severe without psychotic features: Secondary | ICD-10-CM | POA: Diagnosis not present

## 2023-05-27 DIAGNOSIS — F332 Major depressive disorder, recurrent severe without psychotic features: Secondary | ICD-10-CM | POA: Diagnosis not present

## 2023-08-06 DIAGNOSIS — F332 Major depressive disorder, recurrent severe without psychotic features: Secondary | ICD-10-CM | POA: Diagnosis not present

## 2023-09-15 DIAGNOSIS — M170 Bilateral primary osteoarthritis of knee: Secondary | ICD-10-CM | POA: Diagnosis not present

## 2023-09-17 DIAGNOSIS — Z01818 Encounter for other preprocedural examination: Secondary | ICD-10-CM | POA: Diagnosis not present

## 2023-09-17 DIAGNOSIS — E782 Mixed hyperlipidemia: Secondary | ICD-10-CM | POA: Diagnosis not present

## 2023-11-02 DIAGNOSIS — M25661 Stiffness of right knee, not elsewhere classified: Secondary | ICD-10-CM | POA: Diagnosis not present

## 2023-11-02 DIAGNOSIS — R2689 Other abnormalities of gait and mobility: Secondary | ICD-10-CM | POA: Diagnosis not present

## 2023-11-02 DIAGNOSIS — M25561 Pain in right knee: Secondary | ICD-10-CM | POA: Diagnosis not present

## 2023-11-02 DIAGNOSIS — M6281 Muscle weakness (generalized): Secondary | ICD-10-CM | POA: Diagnosis not present

## 2023-11-05 DIAGNOSIS — Z9889 Other specified postprocedural states: Secondary | ICD-10-CM | POA: Diagnosis not present

## 2023-11-05 DIAGNOSIS — Z961 Presence of intraocular lens: Secondary | ICD-10-CM | POA: Diagnosis not present

## 2023-11-05 DIAGNOSIS — Z8669 Personal history of other diseases of the nervous system and sense organs: Secondary | ICD-10-CM | POA: Diagnosis not present

## 2023-11-05 DIAGNOSIS — H35372 Puckering of macula, left eye: Secondary | ICD-10-CM | POA: Diagnosis not present

## 2023-11-06 DIAGNOSIS — R2689 Other abnormalities of gait and mobility: Secondary | ICD-10-CM | POA: Diagnosis not present

## 2023-11-06 DIAGNOSIS — M6281 Muscle weakness (generalized): Secondary | ICD-10-CM | POA: Diagnosis not present

## 2023-11-06 DIAGNOSIS — M25661 Stiffness of right knee, not elsewhere classified: Secondary | ICD-10-CM | POA: Diagnosis not present

## 2023-11-06 DIAGNOSIS — M25561 Pain in right knee: Secondary | ICD-10-CM | POA: Diagnosis not present

## 2023-11-10 DIAGNOSIS — M25561 Pain in right knee: Secondary | ICD-10-CM | POA: Diagnosis not present

## 2023-11-10 DIAGNOSIS — R2689 Other abnormalities of gait and mobility: Secondary | ICD-10-CM | POA: Diagnosis not present

## 2023-11-10 DIAGNOSIS — M25661 Stiffness of right knee, not elsewhere classified: Secondary | ICD-10-CM | POA: Diagnosis not present

## 2023-11-10 DIAGNOSIS — M6281 Muscle weakness (generalized): Secondary | ICD-10-CM | POA: Diagnosis not present

## 2023-11-13 DIAGNOSIS — M25561 Pain in right knee: Secondary | ICD-10-CM | POA: Diagnosis not present

## 2023-11-13 DIAGNOSIS — M25661 Stiffness of right knee, not elsewhere classified: Secondary | ICD-10-CM | POA: Diagnosis not present

## 2023-11-13 DIAGNOSIS — M6281 Muscle weakness (generalized): Secondary | ICD-10-CM | POA: Diagnosis not present

## 2023-11-13 DIAGNOSIS — R2689 Other abnormalities of gait and mobility: Secondary | ICD-10-CM | POA: Diagnosis not present

## 2023-11-19 DIAGNOSIS — M6281 Muscle weakness (generalized): Secondary | ICD-10-CM | POA: Diagnosis not present

## 2023-11-19 DIAGNOSIS — R2689 Other abnormalities of gait and mobility: Secondary | ICD-10-CM | POA: Diagnosis not present

## 2023-11-19 DIAGNOSIS — M25661 Stiffness of right knee, not elsewhere classified: Secondary | ICD-10-CM | POA: Diagnosis not present

## 2023-11-19 DIAGNOSIS — M25561 Pain in right knee: Secondary | ICD-10-CM | POA: Diagnosis not present

## 2023-11-25 DIAGNOSIS — G8929 Other chronic pain: Secondary | ICD-10-CM | POA: Diagnosis not present

## 2023-11-25 DIAGNOSIS — M170 Bilateral primary osteoarthritis of knee: Secondary | ICD-10-CM | POA: Diagnosis not present

## 2023-11-25 DIAGNOSIS — M1711 Unilateral primary osteoarthritis, right knee: Secondary | ICD-10-CM | POA: Diagnosis not present

## 2023-11-25 DIAGNOSIS — M25561 Pain in right knee: Secondary | ICD-10-CM | POA: Diagnosis not present

## 2023-11-26 DIAGNOSIS — F332 Major depressive disorder, recurrent severe without psychotic features: Secondary | ICD-10-CM | POA: Diagnosis not present

## 2023-12-07 DIAGNOSIS — Z96651 Presence of right artificial knee joint: Secondary | ICD-10-CM | POA: Diagnosis not present

## 2023-12-07 DIAGNOSIS — M25761 Osteophyte, right knee: Secondary | ICD-10-CM | POA: Diagnosis not present

## 2023-12-07 DIAGNOSIS — M1711 Unilateral primary osteoarthritis, right knee: Secondary | ICD-10-CM | POA: Diagnosis not present

## 2023-12-07 DIAGNOSIS — G8918 Other acute postprocedural pain: Secondary | ICD-10-CM | POA: Diagnosis not present

## 2023-12-08 DIAGNOSIS — M1711 Unilateral primary osteoarthritis, right knee: Secondary | ICD-10-CM | POA: Diagnosis not present

## 2023-12-09 DIAGNOSIS — Z96651 Presence of right artificial knee joint: Secondary | ICD-10-CM | POA: Diagnosis not present

## 2023-12-09 DIAGNOSIS — M1711 Unilateral primary osteoarthritis, right knee: Secondary | ICD-10-CM | POA: Diagnosis not present

## 2023-12-10 DIAGNOSIS — Z96651 Presence of right artificial knee joint: Secondary | ICD-10-CM | POA: Diagnosis not present

## 2023-12-10 DIAGNOSIS — M1711 Unilateral primary osteoarthritis, right knee: Secondary | ICD-10-CM | POA: Diagnosis not present

## 2023-12-11 DIAGNOSIS — M1711 Unilateral primary osteoarthritis, right knee: Secondary | ICD-10-CM | POA: Diagnosis not present

## 2023-12-12 DIAGNOSIS — M1711 Unilateral primary osteoarthritis, right knee: Secondary | ICD-10-CM | POA: Diagnosis not present

## 2023-12-13 DIAGNOSIS — M1711 Unilateral primary osteoarthritis, right knee: Secondary | ICD-10-CM | POA: Diagnosis not present

## 2023-12-14 DIAGNOSIS — Z96651 Presence of right artificial knee joint: Secondary | ICD-10-CM | POA: Diagnosis not present

## 2023-12-14 DIAGNOSIS — M1711 Unilateral primary osteoarthritis, right knee: Secondary | ICD-10-CM | POA: Diagnosis not present

## 2023-12-15 DIAGNOSIS — M1711 Unilateral primary osteoarthritis, right knee: Secondary | ICD-10-CM | POA: Diagnosis not present

## 2023-12-16 DIAGNOSIS — Z96651 Presence of right artificial knee joint: Secondary | ICD-10-CM | POA: Diagnosis not present

## 2023-12-16 DIAGNOSIS — M1711 Unilateral primary osteoarthritis, right knee: Secondary | ICD-10-CM | POA: Diagnosis not present

## 2023-12-17 DIAGNOSIS — Z96652 Presence of left artificial knee joint: Secondary | ICD-10-CM | POA: Diagnosis not present

## 2023-12-17 DIAGNOSIS — M1711 Unilateral primary osteoarthritis, right knee: Secondary | ICD-10-CM | POA: Diagnosis not present

## 2023-12-17 DIAGNOSIS — Z96651 Presence of right artificial knee joint: Secondary | ICD-10-CM | POA: Diagnosis not present

## 2023-12-18 DIAGNOSIS — M1711 Unilateral primary osteoarthritis, right knee: Secondary | ICD-10-CM | POA: Diagnosis not present

## 2023-12-18 DIAGNOSIS — M25561 Pain in right knee: Secondary | ICD-10-CM | POA: Diagnosis not present

## 2023-12-18 DIAGNOSIS — R2689 Other abnormalities of gait and mobility: Secondary | ICD-10-CM | POA: Diagnosis not present

## 2023-12-18 DIAGNOSIS — M25661 Stiffness of right knee, not elsewhere classified: Secondary | ICD-10-CM | POA: Diagnosis not present

## 2023-12-18 DIAGNOSIS — M6281 Muscle weakness (generalized): Secondary | ICD-10-CM | POA: Diagnosis not present

## 2023-12-18 DIAGNOSIS — Z96651 Presence of right artificial knee joint: Secondary | ICD-10-CM | POA: Diagnosis not present

## 2023-12-19 DIAGNOSIS — M1711 Unilateral primary osteoarthritis, right knee: Secondary | ICD-10-CM | POA: Diagnosis not present

## 2023-12-20 DIAGNOSIS — M1711 Unilateral primary osteoarthritis, right knee: Secondary | ICD-10-CM | POA: Diagnosis not present

## 2023-12-22 DIAGNOSIS — M25661 Stiffness of right knee, not elsewhere classified: Secondary | ICD-10-CM | POA: Diagnosis not present

## 2023-12-22 DIAGNOSIS — M6281 Muscle weakness (generalized): Secondary | ICD-10-CM | POA: Diagnosis not present

## 2023-12-22 DIAGNOSIS — R2689 Other abnormalities of gait and mobility: Secondary | ICD-10-CM | POA: Diagnosis not present

## 2023-12-22 DIAGNOSIS — M25561 Pain in right knee: Secondary | ICD-10-CM | POA: Diagnosis not present

## 2023-12-24 DIAGNOSIS — M6281 Muscle weakness (generalized): Secondary | ICD-10-CM | POA: Diagnosis not present

## 2023-12-24 DIAGNOSIS — M25661 Stiffness of right knee, not elsewhere classified: Secondary | ICD-10-CM | POA: Diagnosis not present

## 2023-12-24 DIAGNOSIS — R2689 Other abnormalities of gait and mobility: Secondary | ICD-10-CM | POA: Diagnosis not present

## 2023-12-24 DIAGNOSIS — M25561 Pain in right knee: Secondary | ICD-10-CM | POA: Diagnosis not present

## 2023-12-29 DIAGNOSIS — M25661 Stiffness of right knee, not elsewhere classified: Secondary | ICD-10-CM | POA: Diagnosis not present

## 2023-12-29 DIAGNOSIS — M25561 Pain in right knee: Secondary | ICD-10-CM | POA: Diagnosis not present

## 2023-12-29 DIAGNOSIS — M6281 Muscle weakness (generalized): Secondary | ICD-10-CM | POA: Diagnosis not present

## 2023-12-29 DIAGNOSIS — R2689 Other abnormalities of gait and mobility: Secondary | ICD-10-CM | POA: Diagnosis not present

## 2023-12-31 DIAGNOSIS — M6281 Muscle weakness (generalized): Secondary | ICD-10-CM | POA: Diagnosis not present

## 2023-12-31 DIAGNOSIS — M25561 Pain in right knee: Secondary | ICD-10-CM | POA: Diagnosis not present

## 2023-12-31 DIAGNOSIS — M25661 Stiffness of right knee, not elsewhere classified: Secondary | ICD-10-CM | POA: Diagnosis not present

## 2023-12-31 DIAGNOSIS — R2689 Other abnormalities of gait and mobility: Secondary | ICD-10-CM | POA: Diagnosis not present

## 2024-01-06 DIAGNOSIS — M6281 Muscle weakness (generalized): Secondary | ICD-10-CM | POA: Diagnosis not present

## 2024-01-06 DIAGNOSIS — R2689 Other abnormalities of gait and mobility: Secondary | ICD-10-CM | POA: Diagnosis not present

## 2024-01-06 DIAGNOSIS — M25661 Stiffness of right knee, not elsewhere classified: Secondary | ICD-10-CM | POA: Diagnosis not present

## 2024-01-06 DIAGNOSIS — M25561 Pain in right knee: Secondary | ICD-10-CM | POA: Diagnosis not present

## 2024-01-07 DIAGNOSIS — R2689 Other abnormalities of gait and mobility: Secondary | ICD-10-CM | POA: Diagnosis not present

## 2024-01-07 DIAGNOSIS — M6281 Muscle weakness (generalized): Secondary | ICD-10-CM | POA: Diagnosis not present

## 2024-01-07 DIAGNOSIS — M25561 Pain in right knee: Secondary | ICD-10-CM | POA: Diagnosis not present

## 2024-01-07 DIAGNOSIS — M25661 Stiffness of right knee, not elsewhere classified: Secondary | ICD-10-CM | POA: Diagnosis not present

## 2024-01-12 DIAGNOSIS — M25561 Pain in right knee: Secondary | ICD-10-CM | POA: Diagnosis not present

## 2024-01-12 DIAGNOSIS — M6281 Muscle weakness (generalized): Secondary | ICD-10-CM | POA: Diagnosis not present

## 2024-01-12 DIAGNOSIS — R2689 Other abnormalities of gait and mobility: Secondary | ICD-10-CM | POA: Diagnosis not present

## 2024-01-12 DIAGNOSIS — M25661 Stiffness of right knee, not elsewhere classified: Secondary | ICD-10-CM | POA: Diagnosis not present

## 2024-01-15 DIAGNOSIS — M25661 Stiffness of right knee, not elsewhere classified: Secondary | ICD-10-CM | POA: Diagnosis not present

## 2024-01-15 DIAGNOSIS — M25561 Pain in right knee: Secondary | ICD-10-CM | POA: Diagnosis not present

## 2024-01-15 DIAGNOSIS — M6281 Muscle weakness (generalized): Secondary | ICD-10-CM | POA: Diagnosis not present

## 2024-01-15 DIAGNOSIS — R2689 Other abnormalities of gait and mobility: Secondary | ICD-10-CM | POA: Diagnosis not present

## 2024-01-19 DIAGNOSIS — R2689 Other abnormalities of gait and mobility: Secondary | ICD-10-CM | POA: Diagnosis not present

## 2024-01-19 DIAGNOSIS — M25661 Stiffness of right knee, not elsewhere classified: Secondary | ICD-10-CM | POA: Diagnosis not present

## 2024-01-19 DIAGNOSIS — M25561 Pain in right knee: Secondary | ICD-10-CM | POA: Diagnosis not present

## 2024-01-19 DIAGNOSIS — M6281 Muscle weakness (generalized): Secondary | ICD-10-CM | POA: Diagnosis not present

## 2024-01-21 DIAGNOSIS — Z96651 Presence of right artificial knee joint: Secondary | ICD-10-CM | POA: Diagnosis not present

## 2024-01-21 DIAGNOSIS — M25661 Stiffness of right knee, not elsewhere classified: Secondary | ICD-10-CM | POA: Diagnosis not present

## 2024-01-21 DIAGNOSIS — M6281 Muscle weakness (generalized): Secondary | ICD-10-CM | POA: Diagnosis not present

## 2024-01-21 DIAGNOSIS — M25561 Pain in right knee: Secondary | ICD-10-CM | POA: Diagnosis not present

## 2024-01-21 DIAGNOSIS — R2689 Other abnormalities of gait and mobility: Secondary | ICD-10-CM | POA: Diagnosis not present

## 2024-01-25 DIAGNOSIS — M6281 Muscle weakness (generalized): Secondary | ICD-10-CM | POA: Diagnosis not present

## 2024-01-25 DIAGNOSIS — R2689 Other abnormalities of gait and mobility: Secondary | ICD-10-CM | POA: Diagnosis not present

## 2024-01-25 DIAGNOSIS — M25661 Stiffness of right knee, not elsewhere classified: Secondary | ICD-10-CM | POA: Diagnosis not present

## 2024-01-25 DIAGNOSIS — M25561 Pain in right knee: Secondary | ICD-10-CM | POA: Diagnosis not present

## 2024-01-28 DIAGNOSIS — M6281 Muscle weakness (generalized): Secondary | ICD-10-CM | POA: Diagnosis not present

## 2024-01-28 DIAGNOSIS — R2689 Other abnormalities of gait and mobility: Secondary | ICD-10-CM | POA: Diagnosis not present

## 2024-01-28 DIAGNOSIS — M25661 Stiffness of right knee, not elsewhere classified: Secondary | ICD-10-CM | POA: Diagnosis not present

## 2024-01-28 DIAGNOSIS — M25561 Pain in right knee: Secondary | ICD-10-CM | POA: Diagnosis not present

## 2024-02-01 DIAGNOSIS — M6281 Muscle weakness (generalized): Secondary | ICD-10-CM | POA: Diagnosis not present

## 2024-02-01 DIAGNOSIS — M25561 Pain in right knee: Secondary | ICD-10-CM | POA: Diagnosis not present

## 2024-02-01 DIAGNOSIS — M25661 Stiffness of right knee, not elsewhere classified: Secondary | ICD-10-CM | POA: Diagnosis not present

## 2024-02-01 DIAGNOSIS — R2689 Other abnormalities of gait and mobility: Secondary | ICD-10-CM | POA: Diagnosis not present

## 2024-02-04 DIAGNOSIS — M6281 Muscle weakness (generalized): Secondary | ICD-10-CM | POA: Diagnosis not present

## 2024-02-04 DIAGNOSIS — M25661 Stiffness of right knee, not elsewhere classified: Secondary | ICD-10-CM | POA: Diagnosis not present

## 2024-02-04 DIAGNOSIS — M25561 Pain in right knee: Secondary | ICD-10-CM | POA: Diagnosis not present

## 2024-02-04 DIAGNOSIS — R2689 Other abnormalities of gait and mobility: Secondary | ICD-10-CM | POA: Diagnosis not present

## 2024-03-02 DIAGNOSIS — F902 Attention-deficit hyperactivity disorder, combined type: Secondary | ICD-10-CM | POA: Diagnosis not present

## 2024-03-02 DIAGNOSIS — F314 Bipolar disorder, current episode depressed, severe, without psychotic features: Secondary | ICD-10-CM | POA: Diagnosis not present

## 2024-03-03 DIAGNOSIS — Z96651 Presence of right artificial knee joint: Secondary | ICD-10-CM | POA: Diagnosis not present

## 2024-03-23 DIAGNOSIS — K080 Exfoliation of teeth due to systemic causes: Secondary | ICD-10-CM | POA: Diagnosis not present

## 2024-03-31 DIAGNOSIS — F902 Attention-deficit hyperactivity disorder, combined type: Secondary | ICD-10-CM | POA: Diagnosis not present

## 2024-03-31 DIAGNOSIS — F314 Bipolar disorder, current episode depressed, severe, without psychotic features: Secondary | ICD-10-CM | POA: Diagnosis not present

## 2024-04-01 DIAGNOSIS — I10 Essential (primary) hypertension: Secondary | ICD-10-CM | POA: Diagnosis not present

## 2024-04-01 DIAGNOSIS — R7303 Prediabetes: Secondary | ICD-10-CM | POA: Diagnosis not present

## 2024-04-08 DIAGNOSIS — I10 Essential (primary) hypertension: Secondary | ICD-10-CM | POA: Diagnosis not present

## 2024-04-08 DIAGNOSIS — E782 Mixed hyperlipidemia: Secondary | ICD-10-CM | POA: Diagnosis not present

## 2024-04-08 DIAGNOSIS — Z6828 Body mass index (BMI) 28.0-28.9, adult: Secondary | ICD-10-CM | POA: Diagnosis not present

## 2024-04-08 DIAGNOSIS — R7303 Prediabetes: Secondary | ICD-10-CM | POA: Diagnosis not present

## 2024-04-08 DIAGNOSIS — Z6825 Body mass index (BMI) 25.0-25.9, adult: Secondary | ICD-10-CM | POA: Diagnosis not present

## 2024-04-19 DIAGNOSIS — K080 Exfoliation of teeth due to systemic causes: Secondary | ICD-10-CM | POA: Diagnosis not present

## 2024-05-03 DIAGNOSIS — F314 Bipolar disorder, current episode depressed, severe, without psychotic features: Secondary | ICD-10-CM | POA: Diagnosis not present

## 2024-05-03 DIAGNOSIS — F902 Attention-deficit hyperactivity disorder, combined type: Secondary | ICD-10-CM | POA: Diagnosis not present

## 2024-05-25 DIAGNOSIS — F902 Attention-deficit hyperactivity disorder, combined type: Secondary | ICD-10-CM | POA: Diagnosis not present

## 2024-05-25 DIAGNOSIS — F314 Bipolar disorder, current episode depressed, severe, without psychotic features: Secondary | ICD-10-CM | POA: Diagnosis not present

## 2024-05-26 DIAGNOSIS — L538 Other specified erythematous conditions: Secondary | ICD-10-CM | POA: Diagnosis not present

## 2024-05-26 DIAGNOSIS — L820 Inflamed seborrheic keratosis: Secondary | ICD-10-CM | POA: Diagnosis not present

## 2024-05-26 DIAGNOSIS — Z1283 Encounter for screening for malignant neoplasm of skin: Secondary | ICD-10-CM | POA: Diagnosis not present

## 2024-05-26 DIAGNOSIS — D2239 Melanocytic nevi of other parts of face: Secondary | ICD-10-CM | POA: Diagnosis not present

## 2024-05-26 DIAGNOSIS — R208 Other disturbances of skin sensation: Secondary | ICD-10-CM | POA: Diagnosis not present

## 2024-05-26 DIAGNOSIS — D485 Neoplasm of uncertain behavior of skin: Secondary | ICD-10-CM | POA: Diagnosis not present

## 2024-06-22 DIAGNOSIS — D0439 Carcinoma in situ of skin of other parts of face: Secondary | ICD-10-CM | POA: Diagnosis not present

## 2024-07-26 DIAGNOSIS — F314 Bipolar disorder, current episode depressed, severe, without psychotic features: Secondary | ICD-10-CM | POA: Diagnosis not present

## 2024-07-26 DIAGNOSIS — F902 Attention-deficit hyperactivity disorder, combined type: Secondary | ICD-10-CM | POA: Diagnosis not present

## 2024-08-09 DIAGNOSIS — L659 Nonscarring hair loss, unspecified: Secondary | ICD-10-CM | POA: Diagnosis not present

## 2024-08-09 DIAGNOSIS — D1801 Hemangioma of skin and subcutaneous tissue: Secondary | ICD-10-CM | POA: Diagnosis not present

## 2024-08-09 DIAGNOSIS — D485 Neoplasm of uncertain behavior of skin: Secondary | ICD-10-CM | POA: Diagnosis not present

## 2024-08-09 DIAGNOSIS — L570 Actinic keratosis: Secondary | ICD-10-CM | POA: Diagnosis not present

## 2024-08-09 DIAGNOSIS — L821 Other seborrheic keratosis: Secondary | ICD-10-CM | POA: Diagnosis not present
